# Patient Record
Sex: Female | Born: 1945 | Race: White | Hispanic: No | Marital: Married | Smoking: Current every day smoker
Health system: Southern US, Community
[De-identification: ages and names within clinical notes are randomized; demographics above are authoritative.]

## PROBLEM LIST (undated history)

## (undated) DIAGNOSIS — I499 Cardiac arrhythmia, unspecified: Secondary | ICD-10-CM

## (undated) DIAGNOSIS — J449 Chronic obstructive pulmonary disease, unspecified: Secondary | ICD-10-CM

## (undated) DIAGNOSIS — E119 Type 2 diabetes mellitus without complications: Secondary | ICD-10-CM

## (undated) DIAGNOSIS — R0602 Shortness of breath: Secondary | ICD-10-CM

## (undated) DIAGNOSIS — I10 Essential (primary) hypertension: Secondary | ICD-10-CM

## (undated) DIAGNOSIS — M1990 Unspecified osteoarthritis, unspecified site: Secondary | ICD-10-CM

## (undated) HISTORY — PX: CHOLECYSTECTOMY: SHX55

## (undated) HISTORY — PX: HERNIA REPAIR: SHX51

---

## 2004-05-28 ENCOUNTER — Emergency Department: Payer: Self-pay | Admitting: General Practice

## 2004-05-28 ENCOUNTER — Other Ambulatory Visit: Payer: Self-pay

## 2005-01-29 ENCOUNTER — Ambulatory Visit: Payer: Self-pay | Admitting: Family Medicine

## 2006-07-11 ENCOUNTER — Observation Stay: Payer: Self-pay | Admitting: Internal Medicine

## 2006-07-11 ENCOUNTER — Other Ambulatory Visit: Payer: Self-pay

## 2006-07-14 ENCOUNTER — Ambulatory Visit: Payer: Self-pay | Admitting: Internal Medicine

## 2006-08-07 ENCOUNTER — Ambulatory Visit: Payer: Self-pay

## 2007-08-10 ENCOUNTER — Ambulatory Visit: Payer: Self-pay

## 2008-08-08 ENCOUNTER — Inpatient Hospital Stay: Payer: Self-pay | Admitting: Specialist

## 2008-08-15 ENCOUNTER — Ambulatory Visit: Payer: Self-pay | Admitting: Specialist

## 2008-08-29 ENCOUNTER — Ambulatory Visit: Payer: Self-pay | Admitting: Specialist

## 2008-09-20 ENCOUNTER — Ambulatory Visit: Payer: Self-pay | Admitting: Specialist

## 2009-01-31 ENCOUNTER — Ambulatory Visit: Payer: Self-pay | Admitting: Unknown Physician Specialty

## 2009-03-15 ENCOUNTER — Ambulatory Visit: Payer: Self-pay | Admitting: Nurse Practitioner

## 2009-10-09 ENCOUNTER — Ambulatory Visit: Payer: Self-pay | Admitting: Nurse Practitioner

## 2010-10-11 ENCOUNTER — Ambulatory Visit: Payer: Self-pay | Admitting: Family Medicine

## 2011-11-08 ENCOUNTER — Ambulatory Visit: Payer: Self-pay

## 2011-11-12 ENCOUNTER — Ambulatory Visit: Payer: Self-pay | Admitting: Family Medicine

## 2011-12-24 ENCOUNTER — Ambulatory Visit: Payer: Self-pay | Admitting: Internal Medicine

## 2012-09-02 ENCOUNTER — Ambulatory Visit: Payer: Self-pay | Admitting: Surgery

## 2012-09-09 ENCOUNTER — Ambulatory Visit: Payer: Self-pay | Admitting: Surgery

## 2012-10-07 ENCOUNTER — Ambulatory Visit: Payer: Self-pay | Admitting: Internal Medicine

## 2012-11-12 ENCOUNTER — Ambulatory Visit: Payer: Self-pay | Admitting: Nurse Practitioner

## 2013-03-19 ENCOUNTER — Ambulatory Visit: Payer: Self-pay | Admitting: Cardiology

## 2013-11-30 ENCOUNTER — Ambulatory Visit: Payer: Self-pay | Admitting: Nurse Practitioner

## 2014-03-24 ENCOUNTER — Ambulatory Visit: Payer: Self-pay | Admitting: Physical Medicine and Rehabilitation

## 2014-04-19 ENCOUNTER — Other Ambulatory Visit (HOSPITAL_COMMUNITY): Payer: Self-pay | Admitting: Neurosurgery

## 2014-04-25 ENCOUNTER — Encounter (HOSPITAL_COMMUNITY): Payer: Self-pay | Admitting: Pharmacy Technician

## 2014-04-29 ENCOUNTER — Encounter (HOSPITAL_COMMUNITY)
Admission: RE | Admit: 2014-04-29 | Discharge: 2014-04-29 | Disposition: A | Discharge status: Home / Self Care | Payer: Medicare Other | Source: Ambulatory Visit | Attending: Neurosurgery | Admitting: Neurosurgery

## 2014-04-29 ENCOUNTER — Encounter (HOSPITAL_COMMUNITY): Payer: Self-pay

## 2014-04-29 DIAGNOSIS — E0590 Thyrotoxicosis, unspecified without thyrotoxic crisis or storm: Secondary | ICD-10-CM | POA: Diagnosis not present

## 2014-04-29 DIAGNOSIS — Z01818 Encounter for other preprocedural examination: Secondary | ICD-10-CM | POA: Insufficient documentation

## 2014-04-29 DIAGNOSIS — I10 Essential (primary) hypertension: Secondary | ICD-10-CM | POA: Diagnosis not present

## 2014-04-29 DIAGNOSIS — J449 Chronic obstructive pulmonary disease, unspecified: Secondary | ICD-10-CM | POA: Insufficient documentation

## 2014-04-29 DIAGNOSIS — E119 Type 2 diabetes mellitus without complications: Secondary | ICD-10-CM | POA: Diagnosis not present

## 2014-04-29 DIAGNOSIS — M4722 Other spondylosis with radiculopathy, cervical region: Secondary | ICD-10-CM | POA: Insufficient documentation

## 2014-04-29 HISTORY — DX: Essential (primary) hypertension: I10

## 2014-04-29 HISTORY — DX: Chronic obstructive pulmonary disease, unspecified: J44.9

## 2014-04-29 HISTORY — DX: Type 2 diabetes mellitus without complications: E11.9

## 2014-04-29 HISTORY — DX: Cardiac arrhythmia, unspecified: I49.9

## 2014-04-29 HISTORY — DX: Unspecified osteoarthritis, unspecified site: M19.90

## 2014-04-29 HISTORY — DX: Shortness of breath: R06.02

## 2014-04-29 LAB — CBC
HCT: 48.6 % — ABNORMAL HIGH (ref 36.0–46.0)
Hemoglobin: 16.1 g/dL — ABNORMAL HIGH (ref 12.0–15.0)
MCH: 28.6 pg (ref 26.0–34.0)
MCHC: 33.1 g/dL (ref 30.0–36.0)
MCV: 86.5 fL (ref 78.0–100.0)
Platelets: 288 10*3/uL (ref 150–400)
RBC: 5.62 MIL/uL — ABNORMAL HIGH (ref 3.87–5.11)
RDW: 13.7 % (ref 11.5–15.5)
WBC: 10.2 10*3/uL (ref 4.0–10.5)

## 2014-04-29 LAB — SURGICAL PCR SCREEN
MRSA, PCR: NEGATIVE
Staphylococcus aureus: NEGATIVE

## 2014-04-29 LAB — BASIC METABOLIC PANEL
Anion gap: 14 (ref 5–15)
BUN: 7 mg/dL (ref 6–23)
CO2: 27 mEq/L (ref 19–32)
CREATININE: 0.53 mg/dL (ref 0.50–1.10)
Calcium: 9.8 mg/dL (ref 8.4–10.5)
Chloride: 94 mEq/L — ABNORMAL LOW (ref 96–112)
GFR calc Af Amer: >90 mL/min (ref >90)
Glucose, Bld: 134 mg/dL — ABNORMAL HIGH (ref 70–99)
Potassium: 4.4 mEq/L (ref 3.7–5.3)
SODIUM: 135 meq/L — AB (ref 137–147)

## 2014-04-29 NOTE — Progress Notes (Signed)
REQUESTED CARDIAC CATH AND CXR  FROM [facility] (DR. FATH ) [phone]

## 2014-04-29 NOTE — Pre-Procedure Instructions (Signed)
[name]  04/29/2014   Your procedure is scheduled on:   Monday  05/09/14  [facility] Admitting at 900 AM.  Call this number if you have problems the morning of surgery: [phone]   Remember:   Do not eat food or drink liquids after midnight.   Take these medicines the morning of surgery with A SIP OF WATER:  ALBUTEROL INHALER, SYMBICORT INHALER, METOPROLOL (TOPROL), POTASSIUM   (STOP ASPIRIN, COUMADIN, PLAVIX, EFFIENT, HERBAL MEDICINES, OMEGA3)   Do not wear jewelry, make-up or nail polish.  Do not wear lotions, powders, or perfumes. You may wear deodorant.  Do not shave 48 hours prior to surgery. Men may shave face and neck.  Do not bring valuables to the hospital.  [facility] is not responsible                  for any belongings or valuables.               Contacts, dentures or bridgework may not be worn into surgery.  Leave suitcase in the car. After surgery it may be brought to your room.  For patients admitted to the hospital, discharge time is determined by your                treatment team.               Patients discharged the day of surgery will not be allowed to drive  home.  Name and phone number of your driver:   Special Instructions:  Special Instructions: [facility] - Preparing for Surgery  Before surgery, you can play an important role.  Because skin is not sterile, your skin needs to be as free of germs as possible.  You can reduce the number of germs on you skin by washing with CHG (chlorahexidine gluconate) soap before surgery.  CHG is an antiseptic cleaner which kills germs and bonds with the skin to continue killing germs even after washing.  Please DO NOT use if you have an allergy to CHG or antibacterial soaps.  If your skin becomes reddened/irritated stop using the CHG and inform your nurse when you arrive at Short Stay.  Do not shave (including legs and underarms) for at least 48 hours prior to the first CHG shower.  You may  shave your face.  Please follow these instructions carefully:   1.  Shower with CHG Soap the night before surgery and the morning of Surgery.  2.  If you choose to wash your hair, wash your hair first as usual with your normal shampoo.  3.  After you shampoo, rinse your hair and body thoroughly to remove the Shampoo.  4.  Use CHG as you would any other liquid soap. You can apply chg directly to the skin and wash gently with scrungie or a clean washcloth.  5.  Apply the CHG Soap to your body ONLY FROM THE NECK DOWN.  Do not use on open wounds or open sores.  Avoid contact with your eyes, ears, mouth and genitals (private parts).  Wash genitals (private parts with your normal soap.  6.  Wash thoroughly, paying special attention to the area where your surgery will be performed.  7.  Thoroughly rinse your body with warm water from the neck down.  8.  DO NOT shower/wash with your normal soap after using and rinsing off the CHG Soap.  9.  Pat yourself dry with a clean towel.  10.  Wear clean pajamas.            11.  Place clean sheets on your bed the night of your first shower and do not sleep with pets.  Day of Surgery  Do not apply any lotions/deodorants the morning of surgery.  Please wear clean clothes to the hospital/surgery center.   Please read over the following fact sheets that you were given: Pain Booklet, Coughing and Deep Breathing, MRSA Information and Surgical Site Infection Prevention

## 2014-05-02 NOTE — Progress Notes (Signed)
Anesthesia Chart Review:  Pt is 68 year old female scheduled for C6-7 anterior cervical decompression with fusion interbody prosthesis plating and bone graft on 05/09/14 with Dr. Lovell SheehanJenkins.   Cardiologist is Dr. Lady [facility]Fath at [facility]. Last visit 10/07/2013.   PMH: HTN, dysrhythmia (SVT), DM, COPD, hyperthyroidism  Medications include: ASA, amlodipine-benazepril, metoprolol, dyazide, metformin, symbicort, albuterol  Preoperative labs reviewed.    [facility] report from [facility] dated 04/14/2014 reviewed.  Showed mildly hyperexpanded lungs without acute cardiopulmonary disease.    EKG dated 10/07/2013 is in chart.  Shows sinus rhythm, possible septal infarct. QS in V1 V2. Minor right precordial repolarization disturbance consider infarct. Nonspecific T wave abnormality.   Pt had 30 day event monitor from 8/14-9/06/2013 that showed sinus rhythm.   Nuclear stress test 03/03/2013: 1. Normal treadmill ECG without evidence of ischemia or infarct or dysrhythmia 2. Average exercise tolerance for age 243. Normal LV function with EF 70% 4. Normal myocardial perfusion images at rest and peak stress without evidence of ischemia  2D echo 03/03/2013: 1. Normal LV systolic function. EF 60% 2. Mild tricuspid insufficiency 3. Mild pulmonary hypertension  R cardiac cath 03/19/2013 for pulmonary hypertension revealed mild to moderate pulmonary hypertension. Report on chart.   Pt has seen cardiologist within the year and had multiple cardiac studies since August 2014. Unless pt develops new symptoms, I anticipate pt can proceed with surgery as scheduled.   Rica Mastngela Kabbe, FNP-BC [facility]/Anesthesiology Phone: [phone] 05/02/2014 2:34 PM

## 2014-05-08 MED ORDER — CEFAZOLIN SODIUM-DEXTROSE 2-3 GM-% IV SOLR
2.0000 g | INTRAVENOUS | Status: AC
  Administered 2014-05-09: 2 g via INTRAVENOUS
  Filled 2014-05-08: qty 50

## 2014-05-09 ENCOUNTER — Encounter (HOSPITAL_COMMUNITY)
Admission: RE | Disposition: A | Discharge status: Home / Self Care | Payer: Self-pay | Source: Ambulatory Visit | Attending: Neurosurgery

## 2014-05-09 ENCOUNTER — Inpatient Hospital Stay (HOSPITAL_COMMUNITY)
Admission: RE | Admit: 2014-05-09 | Discharge: 2014-05-10 | DRG: 473 | Disposition: A | Discharge status: Home / Self Care | Payer: Medicare Other | Source: Ambulatory Visit | Attending: Neurosurgery | Admitting: Neurosurgery

## 2014-05-09 ENCOUNTER — Inpatient Hospital Stay (HOSPITAL_COMMUNITY): Payer: Medicare Other | Admitting: Certified Registered Nurse Anesthetist

## 2014-05-09 ENCOUNTER — Inpatient Hospital Stay (HOSPITAL_COMMUNITY): Payer: Medicare Other

## 2014-05-09 ENCOUNTER — Encounter (HOSPITAL_COMMUNITY): Payer: Self-pay | Admitting: *Deleted

## 2014-05-09 ENCOUNTER — Encounter (HOSPITAL_COMMUNITY): Payer: Medicare Other | Admitting: Vascular Surgery

## 2014-05-09 DIAGNOSIS — F17210 Nicotine dependence, cigarettes, uncomplicated: Secondary | ICD-10-CM | POA: Diagnosis present

## 2014-05-09 DIAGNOSIS — M542 Cervicalgia: Secondary | ICD-10-CM | POA: Diagnosis present

## 2014-05-09 DIAGNOSIS — M5020 Other cervical disc displacement, unspecified cervical region: Secondary | ICD-10-CM

## 2014-05-09 DIAGNOSIS — E119 Type 2 diabetes mellitus without complications: Secondary | ICD-10-CM | POA: Diagnosis present

## 2014-05-09 DIAGNOSIS — M4802 Spinal stenosis, cervical region: Secondary | ICD-10-CM | POA: Diagnosis present

## 2014-05-09 DIAGNOSIS — I10 Essential (primary) hypertension: Secondary | ICD-10-CM | POA: Diagnosis present

## 2014-05-09 DIAGNOSIS — M4722 Other spondylosis with radiculopathy, cervical region: Secondary | ICD-10-CM

## 2014-05-09 DIAGNOSIS — Z79899 Other long term (current) drug therapy: Secondary | ICD-10-CM

## 2014-05-09 DIAGNOSIS — M5012 Cervical disc disorder with radiculopathy, mid-cervical region: Secondary | ICD-10-CM | POA: Diagnosis present

## 2014-05-09 DIAGNOSIS — J449 Chronic obstructive pulmonary disease, unspecified: Secondary | ICD-10-CM | POA: Diagnosis present

## 2014-05-09 DIAGNOSIS — Z7982 Long term (current) use of aspirin: Secondary | ICD-10-CM | POA: Diagnosis not present

## 2014-05-09 HISTORY — PX: ANTERIOR CERVICAL DECOMP/DISCECTOMY FUSION: SHX1161

## 2014-05-09 LAB — GLUCOSE, CAPILLARY
Glucose-Capillary: 159 mg/dL — ABNORMAL HIGH (ref 70–99)
Glucose-Capillary: 125 mg/dL — ABNORMAL HIGH (ref 70–99)
Glucose-Capillary: 178 mg/dL — ABNORMAL HIGH (ref 70–99)

## 2014-05-09 SURGERY — ANTERIOR CERVICAL DECOMPRESSION/DISCECTOMY FUSION 1 LEVEL
Anesthesia: General

## 2014-05-09 MED ORDER — LACTATED RINGERS IV SOLN
INTRAVENOUS | Status: DC | PRN
  Administered 2014-05-09 (×2): via INTRAVENOUS

## 2014-05-09 MED ORDER — DIAZEPAM 5 MG PO TABS: 5.0000 mg | ORAL_TABLET | Freq: Four times a day (QID) | ORAL | Status: DC | PRN

## 2014-05-09 MED ORDER — 0.9 % SODIUM CHLORIDE (POUR BTL) OPTIME
TOPICAL | Status: DC | PRN
  Administered 2014-05-09: 1000 mL

## 2014-05-09 MED ORDER — GLYCOPYRROLATE 0.2 MG/ML IJ SOLN
INTRAMUSCULAR | Status: DC | PRN
  Administered 2014-05-09: 0.4 mg via INTRAVENOUS

## 2014-05-09 MED ORDER — ACETAMINOPHEN 325 MG PO TABS
650.0000 mg | ORAL_TABLET | ORAL | Status: DC | PRN
Start: 2014-05-09 — End: 2014-05-10

## 2014-05-09 MED ORDER — CEFAZOLIN SODIUM-DEXTROSE 2-3 GM-% IV SOLR
2.0000 g | Freq: Three times a day (TID) | INTRAVENOUS | Status: AC
  Administered 2014-05-09 – 2014-05-10 (×2): 2 g via INTRAVENOUS
  Filled 2014-05-09 (×2): qty 50

## 2014-05-09 MED ORDER — BUPIVACAINE-EPINEPHRINE (PF) 0.5% -1:200000 IJ SOLN
INTRAMUSCULAR | Status: DC | PRN
  Administered 2014-05-09: 10 mL via PERINEURAL

## 2014-05-09 MED ORDER — PHENYLEPHRINE 40 MCG/ML (10ML) SYRINGE FOR IV PUSH (FOR BLOOD PRESSURE SUPPORT)
PREFILLED_SYRINGE | INTRAVENOUS | Status: AC
  Filled 2014-05-09: qty 10

## 2014-05-09 MED ORDER — MORPHINE SULFATE 2 MG/ML IJ SOLN
1.0000 mg | INTRAMUSCULAR | Status: DC | PRN
  Administered 2014-05-09: 2 mg via INTRAVENOUS
  Filled 2014-05-09: qty 1

## 2014-05-09 MED ORDER — PHENYLEPHRINE HCL 10 MG/ML IJ SOLN
INTRAMUSCULAR | Status: DC | PRN
  Administered 2014-05-09 (×2): 80 ug via INTRAVENOUS

## 2014-05-09 MED ORDER — ALUM & MAG HYDROXIDE-SIMETH 200-200-20 MG/5ML PO SUSP: 30.0000 mL | Freq: Four times a day (QID) | ORAL | Status: DC | PRN

## 2014-05-09 MED ORDER — PROPOFOL 10 MG/ML IV BOLUS
INTRAVENOUS | Status: DC | PRN
  Administered 2014-05-09: 70 mg via INTRAVENOUS
  Administered 2014-05-09: 120 mg via INTRAVENOUS

## 2014-05-09 MED ORDER — MIDAZOLAM HCL 2 MG/2ML IJ SOLN
INTRAMUSCULAR | Status: AC
  Filled 2014-05-09: qty 2

## 2014-05-09 MED ORDER — METOPROLOL SUCCINATE ER 100 MG PO TB24
100.0000 mg | ORAL_TABLET | Freq: Every day | ORAL | Status: DC
  Administered 2014-05-10: 100 mg via ORAL
  Filled 2014-05-09 (×2): qty 1

## 2014-05-09 MED ORDER — GLYCOPYRROLATE 0.2 MG/ML IJ SOLN
INTRAMUSCULAR | Status: AC
  Filled 2014-05-09: qty 2

## 2014-05-09 MED ORDER — THROMBIN 5000 UNITS EX SOLR
CUTANEOUS | Status: DC | PRN
  Administered 2014-05-09: 5000 [IU] via TOPICAL

## 2014-05-09 MED ORDER — LIDOCAINE HCL (CARDIAC) 20 MG/ML IV SOLN
INTRAVENOUS | Status: AC
  Filled 2014-05-09: qty 5

## 2014-05-09 MED ORDER — HYDROCODONE-ACETAMINOPHEN 5-325 MG PO TABS
1.0000 | ORAL_TABLET | ORAL | Status: DC | PRN
  Administered 2014-05-09: 2 via ORAL
  Filled 2014-05-09: qty 2

## 2014-05-09 MED ORDER — INSULIN ASPART 100 UNIT/ML ~~LOC~~ SOLN
0.0000 [IU] | SUBCUTANEOUS | Status: DC
  Administered 2014-05-09: 3 [IU] via SUBCUTANEOUS

## 2014-05-09 MED ORDER — ONDANSETRON HCL 4 MG/2ML IJ SOLN: 4.0000 mg | Freq: Once | INTRAMUSCULAR | Status: DC | PRN

## 2014-05-09 MED ORDER — METFORMIN HCL ER 500 MG PO TB24
1000.0000 mg | ORAL_TABLET | Freq: Every day | ORAL | Status: DC
  Filled 2014-05-09 (×2): qty 2

## 2014-05-09 MED ORDER — OXYCODONE HCL 5 MG PO TABS: 5.0000 mg | ORAL_TABLET | Freq: Once | ORAL | Status: DC | PRN

## 2014-05-09 MED ORDER — PHENOL 1.4 % MT LIQD: 1.0000 | OROMUCOSAL | Status: DC | PRN

## 2014-05-09 MED ORDER — BUDESONIDE-FORMOTEROL FUMARATE 160-4.5 MCG/ACT IN AERO
2.0000 | INHALATION_SPRAY | Freq: Two times a day (BID) | RESPIRATORY_TRACT | Status: DC
Start: 2014-05-09 — End: 2014-05-10
  Administered 2014-05-09 – 2014-05-10 (×2): 2 via RESPIRATORY_TRACT
  Filled 2014-05-09: qty 6

## 2014-05-09 MED ORDER — LACTATED RINGERS IV SOLN
INTRAVENOUS | Status: DC
  Administered 2014-05-09: 09:00:00 via INTRAVENOUS

## 2014-05-09 MED ORDER — NEOSTIGMINE METHYLSULFATE 10 MG/10ML IV SOLN
INTRAVENOUS | Status: AC
  Filled 2014-05-09: qty 1

## 2014-05-09 MED ORDER — PROPOFOL 10 MG/ML IV BOLUS
INTRAVENOUS | Status: AC
  Filled 2014-05-09: qty 20

## 2014-05-09 MED ORDER — DOCUSATE SODIUM 100 MG PO CAPS
100.0000 mg | ORAL_CAPSULE | Freq: Two times a day (BID) | ORAL | Status: DC
  Filled 2014-05-09 (×3): qty 1

## 2014-05-09 MED ORDER — MENTHOL 3 MG MT LOZG: 1.0000 | LOZENGE | OROMUCOSAL | Status: DC | PRN

## 2014-05-09 MED ORDER — OXYCODONE-ACETAMINOPHEN 5-325 MG PO TABS: 1.0000 | ORAL_TABLET | ORAL | Status: DC | PRN

## 2014-05-09 MED ORDER — FENTANYL CITRATE 0.05 MG/ML IJ SOLN
INTRAMUSCULAR | Status: DC | PRN
  Administered 2014-05-09: 150 ug via INTRAVENOUS
  Administered 2014-05-09: 50 ug via INTRAVENOUS

## 2014-05-09 MED ORDER — ROCURONIUM BROMIDE 100 MG/10ML IV SOLN
INTRAVENOUS | Status: DC | PRN
  Administered 2014-05-09: 50 mg via INTRAVENOUS

## 2014-05-09 MED ORDER — TRIAMTERENE-HCTZ 37.5-25 MG PO CAPS
1.0000 | ORAL_CAPSULE | Freq: Every day | ORAL | Status: DC
  Filled 2014-05-09: qty 1

## 2014-05-09 MED ORDER — INSULIN ASPART 100 UNIT/ML ~~LOC~~ SOLN: 0.0000 [IU] | Freq: Three times a day (TID) | SUBCUTANEOUS | Status: DC

## 2014-05-09 MED ORDER — ACETAMINOPHEN 650 MG RE SUPP: 650.0000 mg | RECTAL | Status: DC | PRN

## 2014-05-09 MED ORDER — AMLODIPINE BESYLATE 2.5 MG PO TABS
2.5000 mg | ORAL_TABLET | Freq: Every day | ORAL | Status: DC
  Filled 2014-05-09: qty 1

## 2014-05-09 MED ORDER — SUCCINYLCHOLINE CHLORIDE 20 MG/ML IJ SOLN
INTRAMUSCULAR | Status: AC
  Filled 2014-05-09: qty 1

## 2014-05-09 MED ORDER — DSS 100 MG PO CAPS: 100.0000 mg | ORAL_CAPSULE | Freq: Two times a day (BID) | ORAL | Status: AC

## 2014-05-09 MED ORDER — ONDANSETRON HCL 4 MG/2ML IJ SOLN
INTRAMUSCULAR | Status: AC
  Filled 2014-05-09: qty 2

## 2014-05-09 MED ORDER — NEOSTIGMINE METHYLSULFATE 10 MG/10ML IV SOLN
INTRAVENOUS | Status: DC | PRN
  Administered 2014-05-09: 3 mg via INTRAVENOUS

## 2014-05-09 MED ORDER — MEPERIDINE HCL 25 MG/ML IJ SOLN: 6.2500 mg | INTRAMUSCULAR | Status: DC | PRN

## 2014-05-09 MED ORDER — DIAZEPAM 5 MG PO TABS: 5.0000 mg | ORAL_TABLET | Freq: Four times a day (QID) | ORAL | Status: AC | PRN

## 2014-05-09 MED ORDER — METFORMIN HCL ER 500 MG PO TB24
500.0000 mg | ORAL_TABLET | Freq: Every day | ORAL | Status: DC
  Administered 2014-05-10: 500 mg via ORAL
  Filled 2014-05-09 (×2): qty 1

## 2014-05-09 MED ORDER — AMLODIPINE BESY-BENAZEPRIL HCL 2.5-10 MG PO CAPS: 1.0000 | ORAL_CAPSULE | Freq: Every day | ORAL | Status: DC

## 2014-05-09 MED ORDER — EPHEDRINE SULFATE 50 MG/ML IJ SOLN
INTRAMUSCULAR | Status: AC
  Filled 2014-05-09: qty 1

## 2014-05-09 MED ORDER — HYDROMORPHONE HCL 1 MG/ML IJ SOLN: 0.2500 mg | INTRAMUSCULAR | Status: DC | PRN

## 2014-05-09 MED ORDER — SODIUM CHLORIDE 0.9 % IR SOLN
Status: DC | PRN
  Administered 2014-05-09: 12:00:00

## 2014-05-09 MED ORDER — ONDANSETRON HCL 4 MG/2ML IJ SOLN
INTRAMUSCULAR | Status: DC | PRN
  Administered 2014-05-09: 4 mg via INTRAVENOUS

## 2014-05-09 MED ORDER — ONDANSETRON HCL 4 MG/2ML IJ SOLN
4.0000 mg | INTRAMUSCULAR | Status: DC | PRN
  Administered 2014-05-09: 4 mg via INTRAVENOUS
  Filled 2014-05-09: qty 2

## 2014-05-09 MED ORDER — METFORMIN HCL ER 500 MG PO TB24: 500.0000 mg | ORAL_TABLET | Freq: Two times a day (BID) | ORAL | Status: DC

## 2014-05-09 MED ORDER — LIDOCAINE HCL (CARDIAC) 20 MG/ML IV SOLN
INTRAVENOUS | Status: DC | PRN
  Administered 2014-05-09: 100 mg via INTRAVENOUS

## 2014-05-09 MED ORDER — DEXAMETHASONE SODIUM PHOSPHATE 4 MG/ML IJ SOLN
4.0000 mg | Freq: Four times a day (QID) | INTRAMUSCULAR | Status: DC
  Administered 2014-05-09: 4 mg via INTRAVENOUS
  Filled 2014-05-09 (×2): qty 1

## 2014-05-09 MED ORDER — MIDAZOLAM HCL 5 MG/5ML IJ SOLN
INTRAMUSCULAR | Status: DC | PRN
  Administered 2014-05-09: 2 mg via INTRAVENOUS

## 2014-05-09 MED ORDER — OXYCODONE-ACETAMINOPHEN 10-325 MG PO TABS: 1.0000 | ORAL_TABLET | ORAL | Status: AC | PRN

## 2014-05-09 MED ORDER — DEXAMETHASONE 4 MG PO TABS
4.0000 mg | ORAL_TABLET | Freq: Four times a day (QID) | ORAL | Status: DC
  Administered 2014-05-09 – 2014-05-10 (×2): 4 mg via ORAL
  Filled 2014-05-09 (×3): qty 1

## 2014-05-09 MED ORDER — HEMOSTATIC AGENTS (NO CHARGE) OPTIME
TOPICAL | Status: DC | PRN
  Administered 2014-05-09: 1 via TOPICAL

## 2014-05-09 MED ORDER — ALBUTEROL SULFATE (2.5 MG/3ML) 0.083% IN NEBU: 3.0000 mL | INHALATION_SOLUTION | Freq: Four times a day (QID) | RESPIRATORY_TRACT | Status: DC | PRN

## 2014-05-09 MED ORDER — INSULIN ASPART 100 UNIT/ML ~~LOC~~ SOLN: 0.0000 [IU] | Freq: Every day | SUBCUTANEOUS | Status: DC

## 2014-05-09 MED ORDER — FENTANYL CITRATE 0.05 MG/ML IJ SOLN
INTRAMUSCULAR | Status: AC
  Filled 2014-05-09: qty 5

## 2014-05-09 MED ORDER — LACTATED RINGERS IV SOLN: INTRAVENOUS | Status: DC

## 2014-05-09 MED ORDER — ROCURONIUM BROMIDE 50 MG/5ML IV SOLN
INTRAVENOUS | Status: AC
  Filled 2014-05-09: qty 1

## 2014-05-09 MED ORDER — OXYCODONE HCL 5 MG/5ML PO SOLN
5.0000 mg | Freq: Once | ORAL | Status: DC | PRN
Start: 2014-05-09 — End: 2014-05-09

## 2014-05-09 MED ORDER — BENAZEPRIL HCL 10 MG PO TABS
10.0000 mg | ORAL_TABLET | Freq: Every day | ORAL | Status: DC
  Filled 2014-05-09: qty 1

## 2014-05-09 MED ORDER — SODIUM CHLORIDE 0.9 % IJ SOLN
INTRAMUSCULAR | Status: AC
  Filled 2014-05-09: qty 10

## 2014-05-09 SURGICAL SUPPLY — 64 items
BAG DECANTER FOR FLEXI CONT (MISCELLANEOUS) ×2 IMPLANT
BENZOIN TINCTURE PRP APPL 2/3 (GAUZE/BANDAGES/DRESSINGS) ×2 IMPLANT
BIT DRILL NEURO 2X3.1 SFT TUCH (MISCELLANEOUS) ×1 IMPLANT
BLADE SURG 15 STRL LF DISP TIS (BLADE) ×1 IMPLANT
BLADE SURG 15 STRL SS (BLADE) ×1
BLADE ULTRA TIP 2M (BLADE) ×2 IMPLANT
BRUSH SCRUB EZ PLAIN DRY (MISCELLANEOUS) ×2 IMPLANT
BUR BARREL STRAIGHT FLUTE 4.0 (BURR) ×2 IMPLANT
BUR MATCHSTICK NEURO 3.0 LAGG (BURR) ×4 IMPLANT
CANISTER SUCT 3000ML (MISCELLANEOUS) ×2 IMPLANT
CONT SPEC 4OZ CLIKSEAL STRL BL (MISCELLANEOUS) ×2 IMPLANT
COVER MAYO STAND STRL (DRAPES) ×2 IMPLANT
DRAPE LAPAROTOMY 100X72 PEDS (DRAPES) ×2 IMPLANT
DRAPE MICROSCOPE LEICA (MISCELLANEOUS) IMPLANT
DRAPE POUCH INSTRU U-SHP 10X18 (DRAPES) ×2 IMPLANT
DRAPE SURG 17X23 STRL (DRAPES) ×4 IMPLANT
DRILL NEURO 2X3.1 SOFT TOUCH (MISCELLANEOUS) ×2
ELECT REM PT RETURN 9FT ADLT (ELECTROSURGICAL) ×2
ELECTRODE REM PT RTRN 9FT ADLT (ELECTROSURGICAL) ×1 IMPLANT
GAUZE SPONGE 4X4 12PLY STRL (GAUZE/BANDAGES/DRESSINGS) ×2 IMPLANT
GAUZE SPONGE 4X4 16PLY XRAY LF (GAUZE/BANDAGES/DRESSINGS) IMPLANT
GLOVE BIO SURGEON STRL SZ8.5 (GLOVE) ×2 IMPLANT
GLOVE BIOGEL PI IND STRL 7.5 (GLOVE) ×1 IMPLANT
GLOVE BIOGEL PI IND STRL 8 (GLOVE) ×1 IMPLANT
GLOVE BIOGEL PI INDICATOR 7.5 (GLOVE) ×1
GLOVE BIOGEL PI INDICATOR 8 (GLOVE) ×1
GLOVE ECLIPSE 7.5 STRL STRAW (GLOVE) ×4 IMPLANT
GLOVE ECLIPSE 9.0 STRL (GLOVE) ×2 IMPLANT
GLOVE EXAM NITRILE LRG STRL (GLOVE) IMPLANT
GLOVE EXAM NITRILE MD LF STRL (GLOVE) IMPLANT
GLOVE EXAM NITRILE XL STR (GLOVE) IMPLANT
GLOVE EXAM NITRILE XS STR PU (GLOVE) IMPLANT
GLOVE SS BIOGEL STRL SZ 8 (GLOVE) ×1 IMPLANT
GLOVE SUPERSENSE BIOGEL SZ 8 (GLOVE) ×1
GLOVE SURG SS PI 7.0 STRL IVOR (GLOVE) ×6 IMPLANT
GOWN STRL REUS W/ TWL LRG LVL3 (GOWN DISPOSABLE) IMPLANT
GOWN STRL REUS W/ TWL XL LVL3 (GOWN DISPOSABLE) ×4 IMPLANT
GOWN STRL REUS W/TWL 2XL LVL3 (GOWN DISPOSABLE) ×2 IMPLANT
GOWN STRL REUS W/TWL LRG LVL3 (GOWN DISPOSABLE)
GOWN STRL REUS W/TWL XL LVL3 (GOWN DISPOSABLE) ×4
KIT BASIN OR (CUSTOM PROCEDURE TRAY) ×2 IMPLANT
KIT ROOM TURNOVER OR (KITS) ×2 IMPLANT
MARKER SKIN DUAL TIP RULER LAB (MISCELLANEOUS) ×2 IMPLANT
NEEDLE HYPO 22GX1.5 SAFETY (NEEDLE) ×2 IMPLANT
NEEDLE SPNL 18GX3.5 QUINCKE PK (NEEDLE) ×2 IMPLANT
NS IRRIG 1000ML POUR BTL (IV SOLUTION) ×2 IMPLANT
PACK LAMINECTOMY NEURO (CUSTOM PROCEDURE TRAY) ×2 IMPLANT
PEEK VISTA 14X14X8MM (Peek) ×2 IMPLANT
PIN DISTRACTION 14MM (PIN) ×4 IMPLANT
PLATE ANT CERV XTEND 1 LV 16 (Plate) ×2 IMPLANT
PUTTY BIOACTIVE 2CC KINEX (Miscellaneous) ×2 IMPLANT
RUBBERBAND STERILE (MISCELLANEOUS) IMPLANT
SCREW XTD VAR 4.2 SELF TAP 12 (Screw) ×8 IMPLANT
SPONGE INTESTINAL PEANUT (DISPOSABLE) ×4 IMPLANT
SPONGE SURGIFOAM ABS GEL SZ50 (HEMOSTASIS) ×2 IMPLANT
STRIP CLOSURE SKIN 1/2X4 (GAUZE/BANDAGES/DRESSINGS) ×2 IMPLANT
SUT VIC AB 0 CT1 27 (SUTURE) ×1
SUT VIC AB 0 CT1 27XBRD ANTBC (SUTURE) ×1 IMPLANT
SUT VIC AB 3-0 SH 8-18 (SUTURE) ×2 IMPLANT
SYR 20ML ECCENTRIC (SYRINGE) ×2 IMPLANT
TAPE CLOTH SURG 4X10 WHT LF (GAUZE/BANDAGES/DRESSINGS) ×2 IMPLANT
TOWEL OR 17X24 6PK STRL BLUE (TOWEL DISPOSABLE) ×2 IMPLANT
TOWEL OR 17X26 10 PK STRL BLUE (TOWEL DISPOSABLE) ×2 IMPLANT
WATER STERILE IRR 1000ML POUR (IV SOLUTION) ×2 IMPLANT

## 2014-05-09 NOTE — Transfer of Care (Signed)
Immediate Anesthesia Transfer of Care Note  Patient: [name]  Procedure(s) Performed: Procedure(s) with comments: CERVICAL SIX TO SEVEN ANTERIOR CERVICAL DECOMPRESSION/DISCECTOMY FUSION 1 LEVEL (N/A) - C67 anterior cervical decompression with fusion interbody prosthesis plating and bonegraft  Patient Location: PACU  Anesthesia Type:General  Level of Consciousness: awake, alert , oriented and patient cooperative  Airway & Oxygen Therapy: Patient Spontanous Breathing and Patient connected to nasal cannula oxygen  Post-op Assessment: Report given to PACU RN, Post -op Vital signs reviewed and stable and Patient moving all extremities  Post vital signs: Reviewed and stable  Complications: No apparent anesthesia complications

## 2014-05-09 NOTE — Progress Notes (Signed)
Utilization review completed.  

## 2014-05-09 NOTE — Progress Notes (Signed)
Orthopedic Tech Progress Note Patient Details:  Kathi SimpersMary C Ciotti Feb 12, 1946 [id number] Patient has soft collar  Patient ID: [name], female   DOB: Feb 12, 1946, 68 y.o.   MRN: [id number]   Jennye MoccasinHughes, Krystal Delduca Craig 05/09/2014, 4:42 PM

## 2014-05-09 NOTE — Anesthesia Procedure Notes (Signed)
Procedure Name: Intubation Date/Time: 05/09/2014 11:08 AM Performed by: Jerilee HohMUMM, Warda Mcqueary N Pre-anesthesia Checklist: Patient identified, Emergency Drugs available, Suction available and Patient being monitored Patient Re-evaluated:Patient Re-evaluated prior to inductionOxygen Delivery Method: Circle system utilized Preoxygenation: Pre-oxygenation with 100% oxygen Intubation Type: IV induction Ventilation: Mask ventilation without difficulty Laryngoscope Size: Mac and 3 Grade View: Grade I Tube type: Oral Tube size: 7.0 mm Number of attempts: 1 Airway Equipment and Method: Stylet Placement Confirmation: ETT inserted through vocal cords under direct vision,  positive ETCO2 and breath sounds checked- equal and bilateral Secured at: 21 cm Tube secured with: Tape Dental Injury: Teeth and Oropharynx as per pre-operative assessment

## 2014-05-09 NOTE — Anesthesia Preprocedure Evaluation (Signed)
Anesthesia Evaluation  Patient identified by MRN, date of birth, ID band Patient awake    Reviewed: Allergy & Precautions, H&P , NPO status , Patient's Chart, lab work & pertinent test results  Airway Mallampati: I  TM Distance: >3 FB Neck ROM: Full    Dental   Pulmonary Current Smoker,          Cardiovascular hypertension, Pt. on medications     Neuro/Psych    GI/Hepatic   Endo/Other  diabetes, Type 2, Oral Hypoglycemic Agents  Renal/GU      Musculoskeletal   Abdominal   Peds  Hematology   Anesthesia Other Findings   Reproductive/Obstetrics                             Anesthesia Physical Anesthesia Plan  ASA: II  Anesthesia Plan: General   Post-op Pain Management:    Induction: Intravenous  Airway Management Planned: Oral ETT  Additional Equipment:   Intra-op Plan:   Post-operative Plan: Extubation in OR  Informed Consent: I have reviewed the patients History and Physical, chart, labs and discussed the procedure including the risks, benefits and alternatives for the proposed anesthesia with the patient or authorized representative who has indicated his/her understanding and acceptance.     Plan Discussed with: CRNA and Surgeon  Anesthesia Plan Comments:         Anesthesia Quick Evaluation  

## 2014-05-09 NOTE — Progress Notes (Signed)
Patient ID: Janet SimpersMary C [name], female   DOB: 1946/06/18, 68 y.o.   MRN: [id number] Subjective:  The patient is alert and pleasant. She is in no apparent distress.  Objective: Vital signs in last 24 hours: Temp:  [97 F (36.1 C)-98 F (36.7 C)] 98 F (36.7 C) (10/26 1633) Pulse Rate:  [67-89] 67 (10/26 1633) Resp:  [15-36] 18 (10/26 1633) BP: (115-150)/(51-83) 122/66 mmHg (10/26 1633) SpO2:  [95 %-100 %] 96 % (10/26 1633) Weight:  [87.091 kg (192 lb)] 87.091 kg (192 lb) (10/26 0853)  Intake/Output from previous day:   Intake/Output this shift: Total I/O In: 1640 [P.O.:240; I.V.:1400] Out: 50 [Blood:50]  Physical exam the patient is alert and oriented. She is moving all 4 extremities well. Her dressing is clean and dry. There is no evidence of hematoma or shift.  Lab Results: No results found for this basename: WBC, HGB, HCT, PLT,  in the last 72 hours BMET No results found for this basename: NA, K, CL, CO2, GLUCOSE, BUN, CREATININE, CALCIUM,  in the last 72 hours  Studies/Results: Dg Cervical Spine 2-3 Views  05/09/2014   CLINICAL DATA:  Cervical spine surgery.  EXAM: CERVICAL SPINE - 2-3 VIEW  COMPARISON:  MRI 03/24/2014.  FINDINGS: Metallic marker is noted over the anterior aspect of the C4-C5 disc space. No acute bony abnormality. Patient is intubated.  IMPRESSION: Metallic marker is noted over the anterior aspect of the C4-C5 disc space. These results will be called to the ordering clinician or representative by the Radiologist Assistant, and communication documented in the PACS or zVision Dashboard.   Electronically Signed   By: Maisie Fushomas  Register   On: 05/09/2014 13:12    Assessment/Plan: The patient is doing well. She likely wants to stay overnight. I gave her discharge instructions and answered all her questions.  LOS: 0 days     Consuela Widener D 05/09/2014, 6:39 PM

## 2014-05-09 NOTE — Anesthesia Postprocedure Evaluation (Signed)
Anesthesia Post Note  Patient: [name]  Procedure(s) Performed: Procedure(s) (LRB): CERVICAL SIX TO SEVEN ANTERIOR CERVICAL DECOMPRESSION/DISCECTOMY FUSION 1 LEVEL (N/A)  Anesthesia type: general  Patient location: PACU  Post pain: Pain level controlled  Post assessment: Patient's Cardiovascular Status Stable  Last Vitals:  Filed Vitals:   05/09/14 1427  BP: 115/51  Pulse: 76  Temp: 36.4 C  Resp: 18    Post vital signs: Reviewed and stable  Level of consciousness: sedated  Complications: No apparent anesthesia complications

## 2014-05-09 NOTE — H&P (Signed)
Subjective: The patient is a 68 year old white female who has complained of neck pain with numbness and tingling in her right arm. She has failed medical management and was worked up with a cervical MRI which demonstrated spondylosis and stenosis most prominent at C6-7. I discussed the various treatment options with the patient. She has decided to proceed with surgery   Past Medical History  Diagnosis Date  . Hypertension   . Dysrhythmia     RAPID  HEART BEAT   . COPD (chronic obstructive pulmonary disease)   . Shortness of breath     WITH EXERTION   . Diabetes mellitus without complication   . Arthritis     Past Surgical History  Procedure Laterality Date  . Hernia repair    . Cholecystectomy    . Cesarean section      Allergies  Allergen Reactions  . Tramadol Nausea Only    History  Substance Use Topics  . Smoking status: Current Every Day Smoker -- 0.50 packs/day for 50 years  . Smokeless tobacco: Not on file  . Alcohol Use: No    History reviewed. No pertinent family history. Prior to Admission medications   Medication Sig Start Date End Date Taking? Authorizing Provider  amlodipine-benazepril (LOTREL) 2.5-10 MG per capsule Take 1 capsule by mouth daily.   Yes Historical Provider, MD  aspirin EC 81 MG tablet Take 81 mg by mouth daily.   Yes Historical Provider, MD  budesonide-formoterol (SYMBICORT) 160-4.5 MCG/ACT inhaler Inhale 2 puffs into the lungs 2 (two) times daily.   Yes Historical Provider, MD  metFORMIN (GLUCOPHAGE-XR) 500 MG 24 hr tablet Take 500-1,000 mg by mouth 2 (two) times daily. Take 500 mg by mouth in the morning and take 1000 mg by mouth at bedtime.   Yes Historical Provider, MD  metoprolol succinate (TOPROL-XL) 100 MG 24 hr tablet Take 100 mg by mouth daily. Take with or immediately following a meal.   Yes Historical Provider, MD  Omega-3 Fatty Acids (ULTRA OMEGA 3 PO) Take 1 capsule by mouth daily.   Yes Historical Provider, MD  Potassium Gluconate 595  MG CAPS Take 595 mg by mouth 6 (six) times daily.   Yes Historical Provider, MD  triamterene-hydrochlorothiazide (DYAZIDE) 37.5-25 MG per capsule Take 1 capsule by mouth daily.   Yes Historical Provider, MD  albuterol (PROVENTIL HFA;VENTOLIN HFA) 108 (90 BASE) MCG/ACT inhaler Inhale 1-2 puffs into the lungs every 6 (six) hours as needed for wheezing or shortness of breath.    Historical Provider, MD     Review of Systems  Positive ROS: As above  All other systems have been reviewed and were otherwise negative with the exception of those mentioned in the HPI and as above.  Objective: Vital signs in last 24 hours: Temp:  [97.5 F (36.4 C)] 97.5 F (36.4 C) (10/26 0853) Pulse Rate:  [70] 70 (10/26 0853) Resp:  [20] 20 (10/26 0853) BP: (150)/(66) 150/66 mmHg (10/26 0853) SpO2:  [99 %] 99 % (10/26 0853) Weight:  [87.091 kg (192 lb)] 87.091 kg (192 lb) (10/26 0853)  General Appearance: Alert, cooperative, no distress, Head: Normocephalic, without obvious abnormality, atraumatic Eyes: PERRL, conjunctiva/corneas clear, EOM's intact,    Ears: Normal  Throat: Normal  Neck: Supple, symmetrical, trachea midline, no adenopathy; thyroid: No enlargement/tenderness/nodules; no carotid bruit or JVD Back: Symmetric, no curvature, ROM normal, no CVA tenderness Lungs: Clear to auscultation bilaterally, respirations unlabored Heart: Regular rate and rhythm, no murmur, rub or gallop Abdomen: Soft, non-tender,, no  masses, no organomegaly Extremities: Extremities normal, atraumatic, no cyanosis or edema Pulses: 2+ and symmetric all extremities Skin: Skin color, texture, turgor normal, no rashes or lesions  NEUROLOGIC:   Mental status: alert and oriented, no aphasia, good attention span, Fund of knowledge/ memory ok Motor Exam - grossly normal Sensory Exam - grossly normal Reflexes:  Coordination - grossly normal Gait - grossly normal Balance - grossly normal Cranial Nerves: I: smell Not tested   II: visual acuity  OS: Normal  OD: Normal   II: visual fields Full to confrontation  II: pupils Equal, round, reactive to light  III,VII: ptosis None  III,IV,VI: extraocular muscles  Full ROM  V: mastication Normal  V: facial light touch sensation  Normal  V,VII: corneal reflex  Present  VII: facial muscle function - upper  Normal  VII: facial muscle function - lower Normal  VIII: hearing Not tested  IX: soft palate elevation  Normal  IX,X: gag reflex Present  XI: trapezius strength  5/5  XI: sternocleidomastoid strength 5/5  XI: neck flexion strength  5/5  XII: tongue strength  Normal    Data Review Lab Results  Component Value Date   WBC 10.2 04/29/2014   HGB 16.1* 04/29/2014   HCT 48.6* 04/29/2014   MCV 86.5 04/29/2014   PLT 288 04/29/2014   Lab Results  Component Value Date   NA 135* 04/29/2014   K 4.4 04/29/2014   CL 94* 04/29/2014   CO2 27 04/29/2014   BUN 7 04/29/2014   CREATININE 0.53 04/29/2014   GLUCOSE 134* 04/29/2014   No results found for this basename: INR, PROTIME    Assessment/Plan: 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 I have reviewed her imaging studies with her and pointed out the abnormalities. We have discussed the various treatment options including surgery. I have described the surgical treatment option of a C6-7 anterior cervical discectomy, fusion, and plating. I have shown her surgical models. We have discussed the risks, benefits, alternatives, and likelihood of achieving goals with surgery. I have answered all the patient's questions. She has decided to proceed with surgery.   Kody Vigil D 05/09/2014 10:53 AM

## 2014-05-09 NOTE — Op Note (Signed)
Brief history: The patient is a 68 year old white female who has complained of neck and right arm pain consistent with a cervical radiculopathy. She has failed medical management and was worked up with cervical MRI which demonstrates spondylosis and foraminal stenosis at C6-7. I discussed the various treatment options with patient including surgery. She has weighed the risks, benefits, and alternative surgery and decided to proceed with a C6-7 anterior cervical discectomy, fusion, and plating.  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 Delma OfficerJeff Antwan Bribiesca  Asst.: Dr. Altamease OilerAndy Pool  Anesthesia: Gen. endotracheal  Estimated blood loss: 50 mL  Drains: None  Complications: None  Description of procedure: The patient was brought to the operating room by the anesthesia team. General endotracheal anesthesia was induced. A roll was placed under the patient's shoulders to keep the neck in the neutral position. The patient's anterior cervical region was then prepared with Betadine scrub and Betadine solution. Sterile drapes were applied.  The area to be incised was then injected with Marcaine with epinephrine solution. I then used a scalpel to make a transverse incision in the patient's left anterior neck. I used the Metzenbaum scissors to divide the platysmal muscle and then to dissect medial to the sternocleidomastoid muscle, jugular vein, and carotid artery. I carefully dissected down towards the anterior cervical spine identifying the esophagus and retracting it medially. Then using Kitner swabs to clear  soft tissue from the anterior cervical spine. We then inserted a bent spinal needle into the upper exposed intervertebral disc space. We then obtained intraoperative radiographs confirm our location.  I then used electrocautery to detach the medial border of the longus colli muscle bilaterally from the C6-7 intervertebral disc spaces. I then inserted the Caspar self-retaining retractor underneath the longus colli muscle bilaterally to provide exposure.  We then incised the intervertebral disc at C6-7. We then performed a partial intervertebral discectomy with a pituitary forceps and the Karlin curettes. I then inserted distraction screws into the vertebral bodies at C6-7. We then distracted the interspace. We then used the high-speed drill to decorticate the vertebral endplates at C6-7, to drill away the remainder of the intervertebral disc, to drill away some posterior spondylosis, and to thin out the posterior longitudinal ligament. I then incised ligament with the arachnoid knife. We then removed the ligament with a Kerrison punches undercutting the vertebral endplates and decompressing the thecal sac. We then performed foraminotomies about the bilateral C7 nerve roots. This completed the decompression at this level.  We now turned our to attention to the interbody fusion. We used the trial spacers to determine the appropriate size for the interbody prosthesis. We then pre-filled prosthesis with a combination of local morcellized autograft bone that we obtained during decompression as well as Kinnex bone graft extender. We then inserted the prosthesis into the distracted interspace at C6-7. We then removed the distraction screws. There was a good snug fit of the prosthesis in the interspace.  Having completed the fusion we now turned attention to the anterior spinal instrumentation. We used the high-speed drill to drill away some anterior spondylosis at the disc spaces so that the plate lay down flat. We  selected the appropriate length titanium anterior cervical plate. We laid it  along the anterior aspect of the vertebral bodies from C6-7. We then drilled 12 mm holes at C6 and C7. We then secured the plate to the vertebral bodies by placing two 12 mm self-tapping screws at C6 and C7. We then obtained intraoperative radiograph. The demonstrating good position of the instrumentation. We therefore secured the screws the plate the locking each cam. This completed the instrumentation.  We then obtained hemostasis using bipolar electrocautery. We irrigated the wound out with bacitracin solution. We then removed the retractor. We inspected the esophagus for any damage. There was none apparent. We then reapproximated patient's platysmal muscle with interrupted 3-0 Vicryl suture. We then reapproximated the subcutaneous tissue with interrupted 3-0 Vicryl suture. The skin was reapproximated with Steri-Strips and benzoin. The wound was then covered with bacitracin ointment. A sterile dressing was applied. The drapes were removed. Patient was subsequently extubated by the anesthesia team and transported to the post anesthesia care unit in stable condition. All sponge instrument and needle counts were reportedly correct at the end of this case.

## 2014-05-09 NOTE — Progress Notes (Signed)
Subjective:  The patient is alert and pleasant. She is in no apparent distress.  Objective: Vital signs in last 24 hours: Temp:  [97.2 F (36.2 C)-97.5 F (36.4 C)] 97.2 F (36.2 C) (10/26 1307) Pulse Rate:  [70-89] 85 (10/26 1322) Resp:  [15-36] 15 (10/26 1322) BP: (125-150)/(55-83) 135/55 mmHg (10/26 1322) SpO2:  [97 %-99 %] 99 % (10/26 1322) Weight:  [87.091 kg (192 lb)] 87.091 kg (192 lb) (10/26 0853)  Intake/Output from previous day:   Intake/Output this shift: Total I/O In: 1400 [I.V.:1400] Out: 50 [Blood:50]  Physical exam the patient is alert and pleasant. She is moving all 4 extremities well. Her dressing is clean and dry. There is no evidence of hematoma or shift.  Lab Results: No results found for this basename: WBC, HGB, HCT, PLT,  in the last 72 hours BMET No results found for this basename: NA, K, CL, CO2, GLUCOSE, BUN, CREATININE, CALCIUM,  in the last 72 hours  Studies/Results: Dg Cervical Spine 2-3 Views  05/09/2014   CLINICAL DATA:  Cervical spine surgery.  EXAM: CERVICAL SPINE - 2-3 VIEW  COMPARISON:  MRI 03/24/2014.  FINDINGS: Metallic marker is noted over the anterior aspect of the C4-C5 disc space. No acute bony abnormality. Patient is intubated.  IMPRESSION: Metallic marker is noted over the anterior aspect of the C4-C5 disc space. These results will be called to the ordering clinician or representative by the Radiologist Assistant, and communication documented in the PACS or zVision Dashboard.   Electronically Signed   By: Maisie Fushomas  Register   On: 05/09/2014 13:12    Assessment/Plan: The patient is doing well. I spoke with her family.  LOS: 0 days     Aiana Nordquist D 05/09/2014, 1:31 PM

## 2014-05-09 NOTE — Plan of Care (Signed)
Problem: Consults Goal: Diagnosis - Spinal Surgery Outcome: Completed/Met Date Met:  05/09/14 Cervical Spine Fusion     

## 2014-05-09 NOTE — Discharge Instructions (Signed)
Wound Care Keep incision covered and dry for  You may remove outer bandage after 3 days  Do not put any creams, lotions, or ointments on incision. Leave steri-strips on neck.  They will fall off by themselves. Activity Walk each and every day, increasing distance each day. No lifting greater than 5 lbs.  Avoid excessive neck motion. No driving for 2 weeks; may ride as a passenger locally. Wear neck brace at all times except when showering or otherwise instructed. Diet Resume your normal diet.  Return to Work Will be discussed at you follow up appointment. Call Your Doctor If Any of These Occur Redness, drainage, or swelling at the wound.  Temperature greater than 101 degrees. Severe pain not relieved by pain medication. Increased difficulty swallowing.  Incision starts to come apart. Follow Up Appt Call today for appointment in 3 weeks [phone]) or for problems.  If you have any hardware placed in your spine, you will need an x-ray before your appointment.

## 2014-05-10 LAB — GLUCOSE, CAPILLARY
Glucose-Capillary: 133 mg/dL — ABNORMAL HIGH (ref 70–99)
Glucose-Capillary: 229 mg/dL — ABNORMAL HIGH (ref 70–99)

## 2014-05-10 NOTE — Evaluation (Signed)
Physical Therapy Evaluation/ Discharge Patient Details Name: [name] MRN: [id number] DOB: 1945-08-07 Today's Date: 05/10/2014   History of Present Illness  Pt admitted for ACDF C6-7  Clinical Impression  Pt moving well with all education for precautions and restrictions as well as collar wear reviewed, demonstrated and practiced with pt and handout provided. Pt moving slowly related to pain post op but overall at Mod I level and safe for return home with family. Pt verbalized understanding of all education and agreeable to no further needs.     Follow Up Recommendations No PT follow up    Equipment Recommendations  None recommended by PT    Recommendations for Other Services       Precautions / Restrictions Precautions Precautions: Cervical Precaution Booklet Issued: Yes (comment) Required Braces or Orthoses: Cervical Brace Cervical Brace: Soft collar;For comfort      Mobility  Bed Mobility Overal bed mobility: Modified Independent                Transfers Overall transfer level: Modified independent               General transfer comment: pt able to demonstrate after education  Ambulation/Gait Ambulation/Gait assistance: Independent              Stairs Stairs: Yes Stairs assistance: Modified independent (Device/Increase time) Stair Management: One rail Right;Alternating pattern;Forwards Number of Stairs: 6    Wheelchair Mobility    Modified Rankin (Stroke Patients Only)       Balance                                             Pertinent Vitals/Pain Pain Assessment: 0-10 Pain Score: 2  Pain Location: anterior neck and chest Pain Descriptors / Indicators: Aching Pain Intervention(s): Limited activity within patient's tolerance;Repositioned    Home Living Family/patient expects to be discharged to:: Private residence Living Arrangements: Spouse/significant other Available Help at Discharge: Family;Available  24 hours/day Type of Home: House Home Access: Stairs to enter Entrance Stairs-Rails: Right Entrance Stairs-Number of Steps: 6 Home Layout: One level Home Equipment: None      Prior Function Level of Independence: Independent               Hand Dominance        Extremity/Trunk Assessment   Upper Extremity Assessment: Overall WFL for tasks assessed (limited within restrictions)           Lower Extremity Assessment: Overall WFL for tasks assessed      Cervical / Trunk Assessment: Other exceptions  Communication   Communication: No difficulties  Cognition Arousal/Alertness: Awake/alert Behavior During Therapy: WFL for tasks assessed/performed Overall Cognitive Status: Within Functional Limits for tasks assessed                      General Comments      Exercises        Assessment/Plan    PT Assessment Patent does not need any further PT services  PT Diagnosis Acute pain   PT Problem List    PT Treatment Interventions     PT Goals (Current goals can be found in the Care Plan section) Acute Rehab PT Goals PT Goal Formulation: All assessment and education complete, DC therapy    Frequency     Barriers to discharge  Co-evaluation               End of Session Equipment Utilized During Treatment: Cervical collar Activity Tolerance: Patient tolerated treatment well Patient left: in chair;with call bell/phone within reach Nurse Communication: Mobility status;Precautions    Functional Assessment Tool Used: clinical judgement Functional Limitation: Mobility: Walking and moving around Mobility: Walking and Moving Around Current Status ([id number]): At least 1 percent but less than 20 percent impaired, limited or restricted Mobility: Walking and Moving Around Goal Status [phone]([phone]): At least 1 percent but less than 20 percent impaired, limited or restricted Mobility: Walking and Moving Around Discharge Status [phone]): At least 1 percent but  less than 20 percent impaired, limited or restricted    Time: 0739-0755 PT Time Calculation (min): 16 min   Charges:   PT Evaluation $Initial PT Evaluation Tier I: 1 Procedure PT Treatments $Therapeutic Activity: 8-22 mins   PT G Codes:   Functional Assessment Tool Used: clinical judgement Functional Limitation: Mobility: Walking and moving around    Delorse Lekabor, Aedon Deason Beth 05/10/2014, 8:37 AM Delaney MeigsMaija Tabor Kaleb Sek, PT [phone]

## 2014-05-10 NOTE — Discharge Summary (Signed)
Physician Discharge Summary  Patient ID: Janet SimpersMary C [name] MRN: [id number] DOB/AGE: 12/19/1945 68 y.o.  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 The surgery went well.  The patient's postoperative course was unremarkable. She requested discharge home on postoperative day #1. The patient was given oral and written discharge instructions. All her questions were answered.  Consults: PT Significant Diagnostic Studies: None Treatments: C6-7 anterior cervical discectomy, fusion, and plating. Discharge Exam: Blood pressure 138/64, pulse 72, temperature 98.4 F (36.9 C), temperature source Oral, resp. rate 20, weight 87.091 kg (192 lb), SpO2 93.00%. The patient is alert and pleasant. She looks well. Her dressing is clean and dry. There is no hematoma or shift. Her strength is normal in all 4 extremities.  Disposition: Home  Discharge Instructions   Call MD for:  difficulty breathing, headache or visual disturbances    Complete by:  As directed      Call MD for:  extreme fatigue    Complete by:  As directed      Call MD for:  hives    Complete by:  As directed      Call MD for:  persistant dizziness or light-headedness    Complete by:  As directed      Call MD for:  persistant nausea and vomiting    Complete by:  As directed      Call MD for:  redness, tenderness, or signs of infection (pain, swelling, redness, odor or green/yellow discharge around incision site)    Complete by:  As directed      Call MD for:  severe uncontrolled pain    Complete by:  As directed      Call MD for:  temperature >100.4    Complete by:  As directed      Diet - low sodium heart healthy    Complete by:  As directed      Discharge instructions    Complete by:  As directed   Call [phone] for a followup appointment. Take a stool softener while you are using pain medications.     Driving Restrictions    Complete by:  As directed   Do not drive for 2 weeks.     Increase activity slowly    Complete by:  As directed      Lifting restrictions    Complete by:  As directed   Do not lift more than 5 pounds. No excessive bending or twisting.     May shower / Bathe    Complete by:  As directed   He may shower after the pain she is removed 3 days after surgery. Leave the incision alone.     Remove dressing in 48 hours    Complete by:  As directed   Your stitches are under the scan and will dissolve by themselves. The Steri-Strips will fall off after you take a few showers. Do not rub back or pick at the wound, Leave the wound alone.            Medication List         albuterol 108 (90 BASE) MCG/ACT inhaler  Commonly known as:  PROVENTIL HFA;VENTOLIN HFA  Inhale 1-2 puffs into the lungs every 6 (six)  hours as needed for wheezing or shortness of breath.     amlodipine-benazepril 2.5-10 MG per capsule  Commonly known as:  LOTREL  Take 1 capsule by mouth daily.     aspirin EC 81 MG tablet  Take 81 mg by mouth daily.     budesonide-formoterol 160-4.5 MCG/ACT inhaler  Commonly known as:  SYMBICORT  Inhale 2 puffs into the lungs 2 (two) times daily.     diazepam 5 MG tablet  Commonly known as:  VALIUM  Take 1 tablet (5 mg total) by mouth every 6 (six) hours as needed for muscle spasms.     DSS 100 MG Caps  Take 100 mg by mouth 2 (two) times daily.     metFORMIN 500 MG 24 hr tablet  Commonly known as:  GLUCOPHAGE-XR  Take 500-1,000 mg by mouth 2 (two) times daily. Take 500 mg by mouth in the morning and take 1000 mg by mouth at bedtime.     metoprolol succinate 100 MG 24 hr tablet  Commonly known as:  TOPROL-XL  Take 100 mg by  mouth daily. Take with or immediately following a meal.     oxyCODONE-acetaminophen 10-325 MG per tablet  Commonly known as:  PERCOCET  Take 1 tablet by mouth every 4 (four) hours as needed for pain.     Potassium Gluconate 595 MG Caps  Take 595 mg by mouth 6 (six) times daily.     triamterene-hydrochlorothiazide 37.5-25 MG per capsule  Commonly known as:  DYAZIDE  Take 1 capsule by mouth daily.     ULTRA OMEGA 3 PO  Take 1 capsule by mouth daily.           Follow-up Information   Follow up with Cristi LoronJENKINS,Keagan Brislin D, MD.   Specialty:  Neurosurgery   Contact information:   [address] [place] [address] [phone]       Signed: Cristi LoronJENKINS,Alfreida Steffenhagen D 05/10/2014, 7:54 AM

## 2014-05-10 NOTE — Progress Notes (Signed)
Patient alert and oriented, mae's well, voiding adequate amount of urine, swallowing without difficulty, no c/o pain. Patient discharged home with family. Script and discharged instructions given to patient. Patient and family stated understanding of d/c instructions given and has an appointment with MD. Aisha Maranda Marte RN 

## 2014-05-11 ENCOUNTER — Encounter (HOSPITAL_COMMUNITY): Payer: Self-pay | Admitting: Neurosurgery

## 2014-11-04 NOTE — Op Note (Signed)
PATIENT NAME:  [name], [name] MR#:  [id number] DATE OF BIRTH:  08-16-1945  DATE OF PROCEDURE:  09/09/2012  PREOPERATIVE DIAGNOSIS: Umbilical hernia.   POSTOPERATIVE DIAGNOSIS: Umbilical hernia.   PROCEDURE: Umbilical hernia repair.   SURGEON: Renda RollsWilton Smith, M.D.   ANESTHESIA: General.   INDICATIONS: This 69 year old female had protracted coughing in December and had discomfort at the navel with bulging. A hernia was demonstrated on physical exam and repair was recommended for definitive treatment.   DESCRIPTION OF PROCEDURE: The patient was placed on the operating table in the supine position under general anesthesia. The hernia was palpated. It was not reducible at this point and the bulge was approximately 4 cm. The abdomen was prepared with ChloraPrep and draped in a sterile manner. A transversely oriented infraumbilical curvilinear incision was made, carried down through a thin layer of subcutaneous tissue to encounter incarcerated properitoneal fat and omentum. The sac was dissected free from surrounding structures and followed down to the fascial ring defect, which was approximately 3 cm in dimension; however, it was possible to enlarge the incision on the left lateral side to allow reduction of the hernia. The fascia was somewhat thin. 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 An atrial mesh was selected and was cut to create an oval shape of some 2.8 x 4 cm in dimension. This was placed into the properitoneal plane, oriented longitudinally, and was sutured to the fascia above the umbilicus with interrupted 0 Surgilon sutures. Next, it was sutured to the fascia below the defect with through-and-through 0 Surgilon and also was sutured to the fascia bilaterally. Next, the repair was carried out with a transversely oriented suture line of interrupted 0 Surgilon figure-of-eight sutures incorporating mesh into each suture. It is noted  that during the course of the procedure, there was minimal bleeding. Several small bleeding points were cauterized. Hemostasis was subsequently intact. Subcutaneous tissues were closed with interrupted 4-0 Monocryl. The skin was closed with running 4-0 Monocryl subcuticular suture and Dermabond. The patient tolerated the surgery satisfactorily and was then prepared for transfer to the recovery room.  ____________________________ Shela CommonsJ. Renda RollsWilton Smith, MD jws:aw D: 09/09/2012 13:07:18 ET T: 09/09/2012 13:14:43 ET JOB#: [id number]  cc: Adella HareJ. Wilton Smith, MD, <Dictator> Adella HareWILTON J SMITH MD ELECTRONICALLY SIGNED 09/10/2012 17:58

## 2015-06-06 ENCOUNTER — Other Ambulatory Visit: Payer: Self-pay | Admitting: Internal Medicine

## 2015-06-06 DIAGNOSIS — E042 Nontoxic multinodular goiter: Secondary | ICD-10-CM

## 2015-06-09 ENCOUNTER — Ambulatory Visit: Payer: Medicare Other

## 2015-08-01 ENCOUNTER — Other Ambulatory Visit: Payer: Self-pay | Admitting: Obstetrics and Gynecology

## 2015-08-01 DIAGNOSIS — Z1231 Encounter for screening mammogram for malignant neoplasm of breast: Secondary | ICD-10-CM

## 2015-08-03 ENCOUNTER — Ambulatory Visit
Admission: RE | Admit: 2015-08-03 | Discharge: 2015-08-03 | Disposition: A | Discharge status: Home / Self Care | Payer: Medicare Other | Source: Ambulatory Visit | Attending: Obstetrics and Gynecology | Admitting: Obstetrics and Gynecology

## 2015-08-03 DIAGNOSIS — Z1231 Encounter for screening mammogram for malignant neoplasm of breast: Secondary | ICD-10-CM | POA: Insufficient documentation

## 2015-08-07 ENCOUNTER — Other Ambulatory Visit: Payer: Self-pay | Admitting: Obstetrics and Gynecology

## 2015-08-07 DIAGNOSIS — R928 Other abnormal and inconclusive findings on diagnostic imaging of breast: Secondary | ICD-10-CM

## 2015-08-18 ENCOUNTER — Ambulatory Visit
Admission: RE | Admit: 2015-08-18 | Discharge: 2015-08-18 | Disposition: A | Discharge status: Home / Self Care | Payer: Medicare Other | Source: Ambulatory Visit | Attending: Obstetrics and Gynecology | Admitting: Obstetrics and Gynecology

## 2015-08-18 DIAGNOSIS — R928 Other abnormal and inconclusive findings on diagnostic imaging of breast: Secondary | ICD-10-CM

## 2015-08-18 DIAGNOSIS — N63 Unspecified lump in breast: Secondary | ICD-10-CM | POA: Insufficient documentation

## 2015-08-21 ENCOUNTER — Ambulatory Visit
Admission: RE | Admit: 2015-08-21 | Discharge: 2015-08-21 | Disposition: A | Discharge status: Home / Self Care | Payer: Medicare Other | Source: Ambulatory Visit | Attending: Internal Medicine | Admitting: Internal Medicine

## 2015-08-21 DIAGNOSIS — E042 Nontoxic multinodular goiter: Secondary | ICD-10-CM | POA: Diagnosis present

## 2016-06-27 ENCOUNTER — Other Ambulatory Visit: Payer: Self-pay | Admitting: Specialist

## 2016-06-27 DIAGNOSIS — J439 Emphysema, unspecified: Secondary | ICD-10-CM

## 2016-06-27 DIAGNOSIS — R0609 Other forms of dyspnea: Secondary | ICD-10-CM

## 2016-07-02 ENCOUNTER — Ambulatory Visit
Admission: RE | Admit: 2016-07-02 | Discharge: 2016-07-02 | Disposition: A | Discharge status: Home / Self Care | Payer: Medicare Other | Source: Ambulatory Visit | Attending: Specialist | Admitting: Specialist

## 2016-07-02 DIAGNOSIS — M5134 Other intervertebral disc degeneration, thoracic region: Secondary | ICD-10-CM | POA: Insufficient documentation

## 2016-07-02 DIAGNOSIS — R0609 Other forms of dyspnea: Secondary | ICD-10-CM | POA: Diagnosis present

## 2016-07-02 DIAGNOSIS — I700 Atherosclerosis of aorta: Secondary | ICD-10-CM | POA: Insufficient documentation

## 2016-07-02 DIAGNOSIS — J439 Emphysema, unspecified: Secondary | ICD-10-CM | POA: Insufficient documentation

## 2016-07-02 DIAGNOSIS — R911 Solitary pulmonary nodule: Secondary | ICD-10-CM | POA: Insufficient documentation

## 2016-07-02 DIAGNOSIS — I2510 Atherosclerotic heart disease of native coronary artery without angina pectoris: Secondary | ICD-10-CM | POA: Diagnosis not present

## 2016-07-04 ENCOUNTER — Other Ambulatory Visit: Payer: Self-pay | Admitting: Specialist

## 2016-07-04 DIAGNOSIS — R918 Other nonspecific abnormal finding of lung field: Secondary | ICD-10-CM

## 2016-08-06 ENCOUNTER — Other Ambulatory Visit: Payer: Self-pay | Admitting: Obstetrics and Gynecology

## 2016-08-06 DIAGNOSIS — Z1231 Encounter for screening mammogram for malignant neoplasm of breast: Secondary | ICD-10-CM

## 2016-08-20 ENCOUNTER — Ambulatory Visit: Admission: RE | Admit: 2016-08-20 | Payer: Medicare Other | Source: Ambulatory Visit

## 2016-08-27 ENCOUNTER — Other Ambulatory Visit: Payer: Self-pay | Admitting: Obstetrics and Gynecology

## 2016-08-27 ENCOUNTER — Ambulatory Visit
Admission: RE | Admit: 2016-08-27 | Discharge: 2016-08-27 | Disposition: A | Discharge status: Home / Self Care | Payer: Medicare Other | Source: Ambulatory Visit | Attending: Obstetrics and Gynecology | Admitting: Obstetrics and Gynecology

## 2016-08-27 DIAGNOSIS — Z1231 Encounter for screening mammogram for malignant neoplasm of breast: Secondary | ICD-10-CM | POA: Diagnosis present

## 2016-09-09 ENCOUNTER — Other Ambulatory Visit: Payer: Self-pay | Admitting: Nurse Practitioner

## 2016-09-09 DIAGNOSIS — S80811A Abrasion, right lower leg, initial encounter: Secondary | ICD-10-CM

## 2016-09-09 DIAGNOSIS — S8011XA Contusion of right lower leg, initial encounter: Secondary | ICD-10-CM

## 2016-09-09 DIAGNOSIS — R600 Localized edema: Secondary | ICD-10-CM

## 2016-09-12 ENCOUNTER — Ambulatory Visit: Payer: Medicare Other | Admitting: Surgery

## 2016-09-13 ENCOUNTER — Encounter: Payer: Medicare Other | Attending: Surgery | Admitting: Surgery

## 2016-09-13 DIAGNOSIS — F17218 Nicotine dependence, cigarettes, with other nicotine-induced disorders: Secondary | ICD-10-CM | POA: Insufficient documentation

## 2016-09-13 DIAGNOSIS — Z881 Allergy status to other antibiotic agents status: Secondary | ICD-10-CM | POA: Insufficient documentation

## 2016-09-13 DIAGNOSIS — Z7982 Long term (current) use of aspirin: Secondary | ICD-10-CM | POA: Insufficient documentation

## 2016-09-13 DIAGNOSIS — Z79899 Other long term (current) drug therapy: Secondary | ICD-10-CM | POA: Diagnosis not present

## 2016-09-13 DIAGNOSIS — Z7984 Long term (current) use of oral hypoglycemic drugs: Secondary | ICD-10-CM | POA: Diagnosis not present

## 2016-09-13 DIAGNOSIS — E11622 Type 2 diabetes mellitus with other skin ulcer: Secondary | ICD-10-CM | POA: Diagnosis not present

## 2016-09-13 DIAGNOSIS — J449 Chronic obstructive pulmonary disease, unspecified: Secondary | ICD-10-CM | POA: Diagnosis not present

## 2016-09-13 DIAGNOSIS — L97212 Non-pressure chronic ulcer of right calf with fat layer exposed: Secondary | ICD-10-CM | POA: Insufficient documentation

## 2016-09-13 DIAGNOSIS — Z886 Allergy status to analgesic agent status: Secondary | ICD-10-CM | POA: Insufficient documentation

## 2016-09-13 DIAGNOSIS — I10 Essential (primary) hypertension: Secondary | ICD-10-CM | POA: Insufficient documentation

## 2016-09-13 DIAGNOSIS — M545 Low back pain: Secondary | ICD-10-CM | POA: Diagnosis not present

## 2016-09-13 DIAGNOSIS — E039 Hypothyroidism, unspecified: Secondary | ICD-10-CM | POA: Diagnosis not present

## 2016-09-13 DIAGNOSIS — M70861 Other soft tissue disorders related to use, overuse and pressure, right lower leg: Secondary | ICD-10-CM | POA: Insufficient documentation

## 2016-09-13 DIAGNOSIS — G8929 Other chronic pain: Secondary | ICD-10-CM | POA: Insufficient documentation

## 2016-09-13 DIAGNOSIS — I471 Supraventricular tachycardia: Secondary | ICD-10-CM | POA: Diagnosis not present

## 2016-09-14 NOTE — Progress Notes (Signed)
SARALYN, WILLISON ([id number]) Visit Report for 09/13/2016 Abuse/Suicide Risk Screen Details [name], [name] Date of Service: 09/13/2016 8:00 AM Medical Record Number: [id number] Patient Account Number: [id number] Date of Birth/Sex: [birth year] (70 y.o. Female) Treating RN: Curtis Sites Primary Care Bella Brummet: Milon Score Other Clinician: Referring Makai Dumond: Treating Jaiven Graveline/Extender: Rudene Re in Treatment: 0 Abuse/Suicide Risk Screen Items Answer ABUSE/SUICIDE RISK SCREEN: Has anyone close to you tried to hurt or harm you recentlyo No Do you feel uncomfortable with anyone in your familyo No Has anyone forced you do things that you didnot want to doo No Do you have any thoughts of harming yourselfo No Patient displays signs or symptoms of abuse and/or neglect. No Electronic Signature(s) Signed: 09/13/2016 4:52:07 PM By: Curtis Sites Entered By: Curtis Sites on 09/13/2016 08:12:27 [name] ([id number]) -------------------------------------------------------------------------------- Activities of Daily Living Details [name] Date of Service: 09/13/2016 8:00 AM Medical Record Number: [id number] Patient Account Number: [id number] Date of Birth/Sex: 02/24/1946 (70 y.o. Female) Treating RN: Curtis Sites Primary Care Georganna Maxson: Milon Score Other Clinician: Referring Shakiera Edelson: Treating Keiandre Cygan/Extender: Rudene Re in Treatment: 0 Activities of Daily Living Items Answer Activities of Daily Living (Please select one for each item) Drive Automobile Completely Able Take Medications Completely Able Use Telephone Completely Able Care for Appearance Completely Able Use Toilet Completely Able Bath / Shower Completely Able Dress Self Completely Able Feed Self Completely Able Walk Completely Able Get In / Out Bed Completely Able Housework Completely Able Prepare Meals Completely Able Handle Money Completely Able Shop for  Self Completely Able Electronic Signature(s) Signed: 09/13/2016 4:52:07 PM By: Curtis Sites Entered By: Curtis Sites on 09/13/2016 08:12:42 [name] ([id number]) -------------------------------------------------------------------------------- Education Assessment Details [name] Date of Service: 09/13/2016 8:00 AM Medical Record Number: [id number] Patient Account Number: [id number] Date of Birth/Sex: 05/01/1946 (70 y.o. Female) Treating RN: Curtis Sites Primary Care Carley Glendenning: Milon Score Other Clinician: Referring Regana Kemple: Treating Sameria Morss/Extender: Rudene Re in Treatment: 0 Primary Learner Assessed: Patient Learning Preferences/Education Level/Primary Language Learning Preference: Explanation, Demonstration Highest Education Level: College or Above Preferred Language: English Cognitive Barrier Assessment/Beliefs Language Barrier: No Translator Needed: No Memory Deficit: No Emotional Barrier: No Cultural/Religious Beliefs Affecting Medical No Care: Physical Barrier Assessment Impaired Vision: No Impaired Hearing: No Decreased Hand dexterity: No Knowledge/Comprehension Assessment Knowledge Level: Medium Comprehension Level: Medium Ability to understand written Medium instructions: Ability to understand verbal Medium instructions: Motivation Assessment Anxiety Level: Calm Cooperation: Cooperative Education Importance: Acknowledges Need Interest in Health Problems: Asks Questions Perception: Coherent Willingness to Engage in Self- Medium Management Activities: Readiness to Engage in Self- Medium Management Activities: Electronic Signature(s) ERVA, KOKE ([id number]) Signed: 09/13/2016 4:52:07 PM By: Curtis Sites Entered By: Curtis Sites on 09/13/2016 08:13:02 KELLYN, MCCARY ([id number]) -------------------------------------------------------------------------------- Fall Risk Assessment Details Patient Name:  [name] Date of Service: 09/13/2016 8:00 AM Medical Record Number: [id number] Patient Account Number: [id number] Date of Birth/Sex: 19-Nov-1945 (71 y.o. Female) Treating RN: Curtis Sites Primary Care Galen Malkowski: Milon Score Other Clinician: Referring Genesis Novosad: Treating Yasenia Reedy/Extender: Rudene Re in Treatment: 0 Fall Risk Assessment Items Have you had 2 or more falls in the last 12 monthso 0 No Have you had any fall that resulted in injury in the last 12 monthso 0 No FALL RISK ASSESSMENT: History of falling - immediate or within 3 months 0 No Secondary diagnosis 0 No Ambulatory aid None/bed rest/wheelchair/nurse 0 Yes Crutches/cane/walker 0 No Furniture 0 No IV Access/Saline Lock 0 No Gait/Training  Normal/bed rest/immobile 0 Yes Weak 0 No Impaired 0 No Mental Status Oriented to own ability 0 Yes Electronic Signature(s) Signed: 09/13/2016 4:52:07 PM By: Curtis Sitesorthy, Joanna Entered By: Curtis Sitesorthy, Joanna on 09/13/2016 08:13:23 [name], [name] ([id number]) -------------------------------------------------------------------------------- Foot Assessment Details [name], [name] Date of Service: 09/13/2016 8:00 AM Medical Record Number: [id number] Patient Account Number: [id number] Date of Birth/Sex: July 29, 1945 (70 y.o. Female) Treating RN: Curtis Sitesorthy, Joanna Primary Care Davionne Dowty: Milon ScoreJONES, CARON Other Clinician: Referring Tanaysia Bhardwaj: Treating Camesha Farooq/Extender: Rudene ReBritto, Errol Weeks in Treatment: 0 Foot Assessment Items Site Locations + = Sensation present, - = Sensation absent, C = Callus, U = Ulcer R = Redness, W = Warmth, M = Maceration, PU = Pre-ulcerative lesion F = Fissure, S = Swelling, D = Dryness Assessment Right: Left: Other Deformity: No No Prior Foot Ulcer: No No Prior Amputation: No No Charcot Joint: No No Ambulatory Status: Ambulatory Without Help Gait: Steady Electronic Signature(s) Signed: 09/13/2016 4:52:07 PM By: Curtis Sitesorthy, Joanna Entered By:  Curtis Sitesorthy, Joanna on 09/13/2016 08:13:52 [name], [name] ([id number]) -------------------------------------------------------------------------------- Nutrition Risk Assessment Details [name], [name] Date of Service: 09/13/2016 8:00 AM Medical Record Number: [id number] Patient Account Number: [id number] Date of Birth/Sex: July 29, 1945 (70 y.o. Female) Treating RN: Curtis Sitesorthy, Joanna Primary Care Cayla Wiegand: Milon ScoreJONES, CARON Other Clinician: Referring Reginald Mangels: Treating Eliza Grissinger/Extender: Rudene ReBritto, Errol Weeks in Treatment: 0 Height (in): 67 Weight (lbs): 197 Body Mass Index (BMI): 30.9 Nutrition Risk Assessment Items NUTRITION RISK SCREEN: I have an illness or condition that made me change the kind and/or 0 No amount of food I eat I eat fewer than two meals per day 0 No I eat few fruits and vegetables, or milk products 0 No I have three or more drinks of beer, liquor or wine almost every day 0 No I have tooth or mouth problems that make it hard for me to eat 0 No I don't always have enough money to buy the food I need 0 No I eat alone most of the time 0 No I take three or more different prescribed or over-the-counter drugs a 1 Yes day Without wanting to, I have lost or gained 10 pounds in the last six 0 No months I am not always physically able to shop, cook and/or feed myself 0 No Nutrition Protocols Good Risk Protocol 0 No interventions needed Moderate Risk Protocol Electronic Signature(s) Signed: 09/13/2016 4:52:07 PM By: Curtis Sitesorthy, Joanna Entered By: Curtis Sitesorthy, Joanna on 09/13/2016 08:13:30

## 2016-09-14 NOTE — Progress Notes (Signed)
Janet SimpersLANGLEY, [name] ([id number]) Visit Report for 09/13/2016 Allergy List Details Patient Name: Janet SimpersLANGLEY, [name] Date of Service: 09/13/2016 8:00 AM Medical Record Number: [id number] Patient Account Number: [id number] Date of Birth/Sex: Dec 09, 1945 (70 y.o. Female) Treating RN: Curtis Sitesorthy, Joanna Primary Care Mekia Dipinto: Milon ScoreJONES, CARON Other Clinician: Referring Roza Creamer: Treating Tiyona Desouza/Extender: Rudene ReBritto, Errol Weeks in Treatment: 0 Allergies Active Allergies tramadol erythromycin estolate Allergy Notes Electronic Signature(s) Signed: 09/13/2016 4:52:07 PM By: Curtis Sitesorthy, Joanna Entered By: Curtis Sitesorthy, Joanna on 09/13/2016 08:12:16 Janet SimpersLANGLEY, [name] ([id number]) -------------------------------------------------------------------------------- Arrival Information Details Patient Name: Janet SimpersLANGLEY, [name] Date of Service: 09/13/2016 8:00 AM Medical Record Number: [id number] Patient Account Number: [id number] Date of Birth/Sex: Dec 09, 1945 [age] y.o. Female) Treating RN: Curtis Sitesorthy, Joanna Primary Care Chanika Byland: Milon ScoreJONES, CARON Other Clinician: Referring Dariyah Garduno: Treating Aleeya Veitch/Extender: Rudene ReBritto, Errol Weeks in Treatment: 0 Visit Information Patient Arrived: Ambulatory Arrival Time: 08:07 Accompanied By: son Transfer Assistance: None Patient Identification Verified: Yes Secondary Verification Process Yes Completed: Patient Has Alerts: Yes Patient Alerts: DMII Electronic Signature(s) Signed: 09/13/2016 4:52:07 PM By: Curtis Sitesorthy, Joanna Entered By: Curtis Sitesorthy, Joanna on 09/13/2016 08:08:10 Janet SimpersLANGLEY, [name] ([id number]) -------------------------------------------------------------------------------- Clinic Level of Care Assessment Details Patient Name: Janet SimpersLANGLEY, [name] Date of Service: 09/13/2016 8:00 AM Medical Record Number: [id number] Patient Account Number: [id number] Date of Birth/Sex: Dec 09, 1945 (70 y.o. Female) Treating RN: Curtis Sitesorthy, Joanna Primary Care Persephanie Laatsch: Milon ScoreJONES, CARON Other Clinician: Referring  Merrisa Skorupski: Treating Amarise Lillo/Extender: Rudene ReBritto, Errol Weeks in Treatment: 0 Clinic Level of Care Assessment Items TOOL 1 Quantity Score []  - Use when EandM and Procedure is performed on INITIAL visit 0 ASSESSMENTS - Nursing Assessment / Reassessment X - General Physical Exam (combine w/ comprehensive assessment (listed just 1 20 below) when performed on new pt. evals) X - Comprehensive Assessment (HX, ROS, Risk Assessments, Wounds Hx, etc.) 1 25 ASSESSMENTS - Wound and Skin Assessment / Reassessment []  - Dermatologic / Skin Assessment (not related to wound area) 0 ASSESSMENTS - Ostomy and/or Continence Assessment and Care []  - Incontinence Assessment and Management 0 []  - Ostomy Care Assessment and Management (repouching, etc.) 0 PROCESS - Coordination of Care X - Simple Patient / Family Education for ongoing care 1 15 []  - Complex (extensive) Patient / Family Education for ongoing care 0 X - Staff obtains ChiropractorConsents, Records, Test Results / Process Orders 1 10 []  - Staff telephones HHA, Nursing Homes / Clarify orders / etc 0 []  - Routine Transfer to another Facility (non-emergent condition) 0 []  - Routine Hospital Admission (non-emergent condition) 0 X - New Admissions / Manufacturing engineernsurance Authorizations / Ordering NPWT, Apligraf, etc. 1 15 []  - Emergency Hospital Admission (emergent condition) 0 PROCESS - Special Needs []  - Pediatric / Minor Patient Management 0 []  - Isolation Patient Management 0 [name], [name] ([id number]) []  - Hearing / Language / Visual special needs 0 []  - Assessment of Community assistance (transportation, D/C planning, etc.) 0 []  - Additional assistance / Altered mentation 0 []  - Support Surface(s) Assessment (bed, cushion, seat, etc.) 0 INTERVENTIONS - Miscellaneous []  - External ear exam 0 []  - Patient Transfer (multiple staff / Nurse, adultHoyer Lift / Similar devices) 0 []  - Simple Staple / Suture removal (25 or less) 0 []  - Complex Staple / Suture removal (26 or more)  0 []  - Hypo/Hyperglycemic Management (do not check if billed separately) 0 X - Ankle / Brachial Index (ABI) - do not check if billed separately 1 15 Has the patient been seen at the hospital within the last three years: Yes Total Score: 100 Level Of Care: New/Established - Level  3 Electronic Signature(s) Signed: 09/13/2016 4:52:07 PM By: Curtis Sites Entered By: Curtis Sites on 09/13/2016 08:56:15 [name] ([id number]) -------------------------------------------------------------------------------- Encounter Discharge Information Details [name] Date of Service: 09/13/2016 8:00 AM Medical Record Number: [id number] Patient Account Number: [id number] Date of Birth/Sex: 17-Aug-1945 (70 y.o. Female) Treating RN: Curtis Sites Primary Care Nyaisha Simao: Milon Score Other Clinician: Referring Muath Hallam: Treating Rakin Lemelle/Extender: Rudene Re in Treatment: 0 Encounter Discharge Information Items Discharge Pain Level: 0 Discharge Condition: Stable Ambulatory Status: Ambulatory Discharge Destination: Home Private Transportation: Auto Accompanied By: son Schedule Follow-up Appointment: Yes Medication Reconciliation completed and No provided to Patient/Care Brent Noto: Clinical Summary of Care: Electronic Signature(s) Signed: 09/13/2016 4:52:07 PM By: Curtis Sites Entered By: Curtis Sites on 09/13/2016 08:45:45 [name] ([id number]) -------------------------------------------------------------------------------- Lower Extremity Assessment Details [name] Date of Service: 09/13/2016 8:00 AM Medical Record Number: [id number] Patient Account Number: [id number] Date of Birth/Sex: 1945-10-31 (70 y.o. Female) Treating RN: Curtis Sites Primary Care Copper Basnett: Milon Score Other Clinician: Referring Shemicka Cohrs: Treating Doss Cybulski/Extender: Rudene Re in Treatment: 0 Edema Assessment Assessed: [Left: No] [Right:  No] Edema: [Left: Yes] [Right: Yes] Calf Left: Right: Point of Measurement: 32 cm From Medial Instep 38.9 cm 37.5 cm Ankle Left: Right: Point of Measurement: 10 cm From Medial Instep 25 cm 24.5 cm Vascular Assessment Pulses: Dorsalis Pedis Palpable: [Left:Yes] [Right:Yes] Doppler Audible: [Left:Yes] [Right:Yes] Posterior Tibial Palpable: [Left:Yes] [Right:Yes] Doppler Audible: [Left:Yes] [Right:Yes] Extremity colors, hair growth, and conditions: Extremity Color: [Left:Hyperpigmented] [Right:Hyperpigmented] Hair Growth on Extremity: [Left:Yes] [Right:Yes] Temperature of Extremity: [Left:Warm] [Right:Warm] Capillary Refill: [Left:< 3 seconds] [Right:< 3 seconds] Blood Pressure: Brachial: [Right:142] Dorsalis Pedis: [Left:Dorsalis Pedis: 162] Ankle: Posterior Tibial: [Left:Posterior Tibial: 148] [Right:1.14] Toe Nail Assessment Left: Right: Thick: Yes Yes Discolored: No No Deformed: No No Improper Length and Hygiene: No No [name], [name] ([id number]) Electronic Signature(s) Signed: 09/13/2016 4:52:07 PM By: Curtis Sites Entered By: Curtis Sites on 09/13/2016 08:35:02 [name] ([id number]) -------------------------------------------------------------------------------- Multi Wound Chart Details [name] Date of Service: 09/13/2016 8:00 AM Medical Record Number: [id number] Patient Account Number: [id number] Date of Birth/Sex: 1946-05-09 (70 y.o. Female) Treating RN: Curtis Sites Primary Care Iverson Sees: Milon Score Other Clinician: Referring Angeline Trick: Treating Korde Jeppsen/Extender: Rudene Re in Treatment: 0 Vital Signs Height(in): 67 Pulse(bpm): 72 Weight(lbs): 197 Blood Pressure 148/63 (mmHg): Body Mass Index(BMI): 31 Temperature(F): 98.0 Respiratory Rate 18 (breaths/min): Photos: [1:No Photos] [N/A:N/A] Wound Location: [1:Right Lower Leg - Anterior, Proximal] [N/A:N/A] Wounding Event: [1:Trauma] [N/A:N/A] Primary  Etiology: [1:Trauma, Other] [N/A:N/A] Comorbid History: [1:Chronic Obstructive Pulmonary Disease (COPD), Hypertension, Type II Diabetes] [N/A:N/A] Date Acquired: [1:08/17/2016] [N/A:N/A] Weeks of Treatment: [1:0] [N/A:N/A] Wound Status: [1:Open] [N/A:N/A] Measurements L x W x D 3.1x1.5x0.1 [N/A:N/A] (cm) Area (cm) : [1:3.652] [N/A:N/A] Volume (cm) : [1:0.365] [N/A:N/A] Classification: [1:Full Thickness Without Exposed Support Structures] [N/A:N/A] HBO Classification: [1:Grade 1] [N/A:N/A] Exudate Amount: [1:Large] [N/A:N/A] Exudate Type: [1:Serous] [N/A:N/A] Exudate Color: [1:amber] [N/A:N/A] Wound Margin: [1:Flat and Intact] [N/A:N/A] Granulation Amount: [1:None Present (0%)] [N/A:N/A] Necrotic Amount: [1:Large (67-100%)] [N/A:N/A] Necrotic Tissue: [1:Eschar] [N/A:N/A] Exposed Structures: [1:Fascia: No Fat Layer (Subcutaneous Tissue) Exposed: No] [N/A:N/A] Tendon: No Muscle: No Joint: No Bone: No Limited to Skin Breakdown Epithelialization: None N/A N/A Debridement: Debridement ([id number]- N/A N/A [id number]) Pre-procedure 08:47 N/A N/A Verification/Time Out Taken: Pain Control: Lidocaine 4% Topical N/A N/A Solution Tissue Debrided: Necrotic/Eschar, N/A N/A Fibrin/Slough, Blood Clots, Subcutaneous Level: Skin/Subcutaneous N/A N/A Tissue Debridement Area (sq 4.65 N/A N/A cm): Instrument: Forceps, Scissors N/A N/A Bleeding:  Minimum N/A N/A Hemostasis Achieved: Pressure N/A N/A Procedural Pain: 5 N/A N/A Post Procedural Pain: 0 N/A N/A Debridement Treatment Procedure was tolerated N/A N/A Response: well Post Debridement 3.1x1.5x0.5 N/A N/A Measurements L x W x D (cm) Post Debridement 1.826 N/A N/A Volume: (cm) Periwound Skin Texture: Excoriation: No N/A N/A Induration: No Callus: No Crepitus: No Rash: No Scarring: No Periwound Skin Maceration: No N/A N/A Moisture: Dry/Scaly: No Periwound Skin Color: Erythema: Yes N/A N/A Atrophie Blanche: No Cyanosis:  No Ecchymosis: No Hemosiderin Staining: No Mottled: No Pallor: No Rubor: No Erythema Location: Circumferential N/A N/A Stetler, [name] ([id number]) Temperature: No Abnormality N/A N/A Tenderness on Yes N/A N/A Palpation: Wound Preparation: Ulcer Cleansing: N/A N/A Rinsed/Irrigated with Saline Topical Anesthetic Applied: Other: lidocaine 4% Procedures Performed: Debridement N/A N/A Treatment Notes Wound #1 (Right, Proximal, Anterior Lower Leg) 1. Cleansed with: Clean wound with Normal Saline 2. Anesthetic Topical Lidocaine 4% cream to wound bed prior to debridement 4. Dressing Applied: Aquacel Ag 5. Secondary Dressing Applied Bordered Foam Dressing Dry Gauze Electronic Signature(s) Signed: 09/13/2016 9:03:35 AM By: Evlyn Kanner MD, FACS Entered By: Evlyn Kanner on 09/13/2016 09:03:35 [name], [name] ([id number]) -------------------------------------------------------------------------------- Multi-Disciplinary Care Plan Details [name] Date of Service: 09/13/2016 8:00 AM Medical Record Number: [id number] Patient Account Number: [id number] Date of Birth/Sex: 1945/10/15 (70 y.o. Female) Treating RN: Curtis Sites Primary Care Aliciana Ricciardi: Milon Score Other Clinician: Referring Winter Jocelyn: Treating Gaila Engebretsen/Extender: Rudene Re in Treatment: 0 Active Inactive ` Necrotic Tissue Nursing Diagnoses: Impaired tissue integrity related to necrotic/devitalized tissue Goals: Necrotic/devitalized tissue will be minimized in the wound bed Date Initiated: 09/13/2016 Target Resolution Date: 10/18/2016 Goal Status: Active Interventions: Assess patient pain level pre-, during and post procedure and prior to discharge Notes: ` Orientation to the Wound Care Program Nursing Diagnoses: Knowledge deficit related to the wound healing center program Goals: Patient/caregiver will verbalize understanding of the Wound Healing Center Program Date Initiated:  09/13/2016 Target Resolution Date: 12/20/2016 Goal Status: Active Interventions: Provide education on orientation to the wound center Notes: ` Wound/Skin Impairment Nursing Diagnoses: Impaired tissue integrity [name], [name] ([id number]) Goals: Patient/caregiver will verbalize understanding of skin care regimen Date Initiated: 09/13/2016 Target Resolution Date: 12/20/2016 Goal Status: Active Ulcer/skin breakdown will have a volume reduction of 30% by week 4 Date Initiated: 09/13/2016 Target Resolution Date: 12/20/2016 Goal Status: Active Ulcer/skin breakdown will have a volume reduction of 50% by week 8 Date Initiated: 09/13/2016 Target Resolution Date: 12/20/2016 Goal Status: Active Ulcer/skin breakdown will have a volume reduction of 80% by week 12 Date Initiated: 09/13/2016 Target Resolution Date: 12/20/2016 Goal Status: Active Ulcer/skin breakdown will heal within 14 weeks Date Initiated: 09/13/2016 Target Resolution Date: 12/20/2016 Goal Status: Active Interventions: Assess patient/caregiver ability to obtain necessary supplies Assess patient/caregiver ability to perform ulcer/skin care regimen upon admission and as needed Assess ulceration(s) every visit Notes: Electronic Signature(s) Signed: 09/13/2016 4:52:07 PM By: Curtis Sites Entered By: Curtis Sites on 09/13/2016 08:44:46 [name] ([id number]) -------------------------------------------------------------------------------- Pain Assessment Details [name] Date of Service: 09/13/2016 8:00 AM Medical Record Number: [id number] Patient Account Number: [id number] Date of Birth/Sex: 1945/07/23 (70 y.o. Female) Treating RN: Curtis Sites Primary Care Noorah Giammona: Milon Score Other Clinician: Referring Saylor Sheckler: Treating Ghazi Rumpf/Extender: Rudene Re in Treatment: 0 Active Problems Location of Pain Severity and Description of Pain Patient Has Paino Yes Site Locations Pain Management and  Medication Current Pain Management: Notes Topical or injectable lidocaine is offered to patient for acute pain  when surgical debridement is performed. If needed, Patient is instructed to use over the counter pain medication for the following 24-48 hours after debridement. Wound care MDs do not prescribed pain medications. Patient has chronic pain or uncontrolled pain. Patient has been instructed to make an appointment with their Primary Care Physician for pain management. Electronic Signature(s) Signed: 09/13/2016 4:52:07 PM By: Curtis Sites Entered By: Curtis Sites on 09/13/2016 08:08:33 [name] ([id number]) -------------------------------------------------------------------------------- Patient/Caregiver Education Details [name] Date of Service: 09/13/2016 8:00 AM Medical Record Number: [id number] Patient Account Number: [id number] Date of Birth/Gender: 1946/04/19 (70 y.o. Female) Treating RN: Curtis Sites Primary Care Physician: Milon Score Other Clinician: Referring Physician: Treating Physician/Extender: Rudene Re in Treatment: 0 Education Assessment Education Provided To: Patient Education Topics Provided Wound/Skin Impairment: Handouts: Other: wound care as ordered Methods: Demonstration, Explain/Verbal Responses: State content correctly Electronic Signature(s) Signed: 09/13/2016 4:52:07 PM By: Curtis Sites Entered By: Curtis Sites on 09/13/2016 08:46:08 [name] ([id number]) -------------------------------------------------------------------------------- Wound Assessment Details [name] Date of Service: 09/13/2016 8:00 AM Medical Record Number: [id number] Patient Account Number: [id number] Date of Birth/Sex: 03/30/1946 (70 y.o. Female) Treating RN: Curtis Sites Primary Care Farzana Koci: Milon Score Other Clinician: Referring Niketa Turner: Treating Caylon Saine/Extender: Rudene Re in  Treatment: 0 Wound Status Wound Number: 1 Primary Trauma, Other Etiology: Wound Location: Right Lower Leg - Anterior, Proximal Wound Open Status: Wounding Event: Trauma Comorbid Chronic Obstructive Pulmonary Date Acquired: 08/17/2016 History: Disease (COPD), Hypertension, Type Weeks Of Treatment: 0 II Diabetes Clustered Wound: No Photos Photo Uploaded By: Curtis Sites on 09/13/2016 14:16:25 Wound Measurements Length: (cm) 3.1 % Reduction i Width: (cm) 1.5 % Reduction i Depth: (cm) 0.1 Epithelializa Area: (cm) 3.652 Tunneling: Volume: (cm) 0.365 Undermining: n Area: n Volume: tion: None No No Wound Description Full Thickness Without Foul Odor Aft Classification: Exposed Support Structures Slough/Fibrin Diabetic Severity Grade 1 (Wagner): Wound Margin: Flat and Intact Exudate Amount: Large Exudate Type: Serous Exudate Color: amber er Cleansing: No o No Wound Bed Granulation Amount: None Present (0%) Exposed Structure Bastarache, Maniyah C. ([id number]) Necrotic Amount: Large (67-100%) Fascia Exposed: No Necrotic Quality: Eschar Fat Layer (Subcutaneous Tissue) Exposed: No Tendon Exposed: No Muscle Exposed: No Joint Exposed: No Bone Exposed: No Limited to Skin Breakdown Periwound Skin Texture Texture Color No Abnormalities Noted: No No Abnormalities Noted: No Callus: No Atrophie Blanche: No Crepitus: No Cyanosis: No Excoriation: No Ecchymosis: No Induration: No Erythema: Yes Rash: No Erythema Location: Circumferential Scarring: No Hemosiderin Staining: No Mottled: No Moisture Pallor: No No Abnormalities Noted: No Rubor: No Dry / Scaly: No Maceration: No Temperature / Pain Temperature: No Abnormality Tenderness on Palpation: Yes Wound Preparation Ulcer Cleansing: Rinsed/Irrigated with Saline Topical Anesthetic Applied: Other: lidocaine 4%, Treatment Notes Wound #1 (Right, Proximal, Anterior Lower Leg) 1. Cleansed with: Clean wound with  Normal Saline 2. Anesthetic Topical Lidocaine 4% cream to wound bed prior to debridement 4. Dressing Applied: Aquacel Ag 5. Secondary Dressing Applied Bordered Foam Dressing Dry Gauze Electronic Signature(s) Signed: 09/13/2016 4:52:07 PM By: Curtis Sites Entered By: Curtis Sites on 09/13/2016 24:40:10 [name] ([id number]) -------------------------------------------------------------------------------- Vitals Details [name] Date of Service: 09/13/2016 8:00 AM Medical Record Number: [id number] Patient Account Number: [id number] Date of Birth/Sex: 1946-02-04 (70 y.o. Female) Treating RN: Curtis Sites Primary Care Xiao Graul: Milon Score Other Clinician: Referring Carolyne Whitsel: Treating Reise Hietala/Extender: Rudene Re in Treatment: 0 Vital Signs Time Taken: 08:08 Temperature (F): 98.0 Height (in): 67 Pulse (bpm): 72 Source:  Measured Respiratory Rate (breaths/min): 18 Weight (lbs): 197 Blood Pressure (mmHg): 148/63 Source: Measured Reference Range: 80 - 120 mg / dl Body Mass Index (BMI): 30.9 Electronic Signature(s) Signed: 09/13/2016 4:52:07 PM By: Curtis Sites Entered By: Curtis Sites on 09/13/2016 08:11:04

## 2016-09-14 NOTE — Progress Notes (Addendum)
[name], [name] ([id number]) Visit Report for 09/13/2016 Chief Complaint Document Details [name], [name] Date of Service: 09/13/2016 8:00 AM Medical Record Number: [id number] Patient Account Number: [id number] Date of Birth/Sex: Jun 28, 1946 (70 y.o. Female) Treating RN: [name] Primary Care Provider: Milon [name] Other Clinician: Referring Provider: Treating Provider/Extender: [name] in Treatment: 0 Information Obtained from: Patient Chief Complaint Patients presents for treatment of an open diabetic ulcer due to a injury to her right lower extremity about 4 weeks Electronic Signature(s) Signed: 09/13/2016 9:04:13 AM By: Janet Kanner MD, FACS Entered By: [name] on 09/13/2016 09:04:13 [name] ([id number]) -------------------------------------------------------------------------------- Debridement Details [name] Date of Service: 09/13/2016 8:00 AM Medical Record Number: [id number] Patient Account Number: [id number] Date of Birth/Sex: [birth year] (70 y.o. Female) Treating RN: [name] Primary Care Provider: Milon [name] Other Clinician: Referring Provider: Treating Provider/Extender: [name] in Treatment: 0 Debridement Performed for Wound #1 Right,Proximal,Anterior Lower Leg Assessment: Performed By: Physician Janet Kanner, MD Debridement: Debridement Pre-procedure Yes - 08:47 Verification/Time Out Taken: Start Time: 08:47 Pain Control: Lidocaine 4% Topical Solution Level: Skin/Subcutaneous Tissue Total Area Debrided (L x 3.1 (cm) x 1.5 (cm) = 4.65 (cm) W): Tissue and other Viable, Non-Viable, Blood Clots, Eschar, Fibrin/Slough, Subcutaneous material debrided: Instrument: Forceps, Scissors Bleeding: Minimum Hemostasis Achieved: Pressure End Time: 08:53 Procedural Pain: 5 Post Procedural Pain: 0 Response to Treatment: Procedure was tolerated well Post Debridement Measurements of Total  Wound Length: (cm) 3.1 Width: (cm) 1.5 Depth: (cm) 0.5 Volume: (cm) 1.826 Character of Wound/Ulcer Post Improved Debridement: Severity of Tissue Post Debridement: Fat layer exposed Post Procedure Diagnosis Same as Pre-procedure Electronic Signature(s) Signed: 09/13/2016 9:03:51 AM By: Janet Kanner MD, FACS Signed: 09/13/2016 4:52:07 PM By: [name] Entered By: [name] on 09/13/2016 09:03:51 [name], [name] ([id number] [name], [name] ([id number]) -------------------------------------------------------------------------------- HPI Details [name] Date of Service: 09/13/2016 8:00 AM Medical Record Number: [id number] Patient Account Number: [id number] Date of Birth/Sex: [birth year] (70 y.o. Female) Treating RN: [name] Primary Care Provider: Milon [name] Other Clinician: Referring Provider: Treating Provider/Extender: [name] in Treatment: 0 History of Present Illness Location: right lower extremity in the area of the lateral calf below the knee Quality: Patient reports experiencing a sharp pain to affected area(s). Severity: Patient states wound are getting worse. Duration: Patient has had the wound for < 4 weeks prior to presenting for treatment Timing: Pain in wound is constant (hurts all the time) Context: The wound occurred when the patient a blunt injury and fall Modifying Factors: Other treatment(s) tried include:courses of doxycycline and local Bactroban ointment Associated Signs and Symptoms: Patient reports having increase discharge. HPI Description: 71 year old patient has been referred to as with right lower extremity wound with edema and ecchymosis, after having a injury due to a fall on 08/20/2016. she has a history of diabetes mellitus type 2 which is uncomplicated, chronic back pain, COPD, hypertension, hypothyroidism, supraventricular tachycardia and tobacco abuse. He is status post neck surgery, appendectomy,  cholecystectomy, hernia repair. He smokes about half a pack of cigarettes a day for the last 50 years. Recently on doxycycline for 10 days and was given symptomatic treatment for wound Electronic Signature(s) Signed: 09/13/2016 9:05:25 AM By: Janet Kanner MD, FACS Previous Signature: 09/13/2016 8:22:14 AM Version By: Janet Kanner MD, FACS Previous Signature: 09/13/2016 8:12:15 AM Version By: Janet Kanner MD, FACS Entered By: [name] on 09/13/2016 09:05:25 [name] ([id number]) -------------------------------------------------------------------------------- Physical Exam Details  [name], [name] Date of Service: 09/13/2016 8:00 AM Medical Record Number: [id number] Patient Account Number: [id number] Date of Birth/Sex: 1945-12-31 (70 y.o. Female) Treating RN: [name] Primary Care Provider: Milon [name] Other Clinician: Referring Provider: Treating Provider/Extender: [name] in Treatment: 0 Constitutional . Pulse regular. Respirations normal and unlabored. Afebrile. . Eyes Nonicteric. Reactive to light. Ears, Nose, Mouth, and Throat Lips, teeth, and gums WNL.Marland Kitchen Moist mucosa without lesions. Neck supple and nontender. No palpable supraclavicular or cervical adenopathy. Normal sized without goiter. Respiratory WNL. No retractions.. Cardiovascular Pedal Pulses WNL. B on the right was 1.14. No clubbing, cyanosis or edema.Marland Kitchen Lymphatic No adneopathy. No adenopathy. No adenopathy. Musculoskeletal Adexa without tenderness or enlargement.. Digits and nails w/o clubbing, cyanosis, infection, petechiae, ischemia, or inflammatory conditions.. Integumentary (Hair, Skin) No suspicious lesions. No crepitus or fluctuance. No peri-wound warmth or erythema. No masses.Marland Kitchen Psychiatric Judgement and insight Intact.. No evidence of depression, anxiety, or agitation.. Notes about 6 inches below her right knee in the area of the soft tissue lateral to the bone she  has a hematoma with a large eschar covering it. Using sharp dissection with a forcep and scissors the eschar was removed the hematoma evacuated and the area washed out with moist saline gauze. Electronic Signature(s) Signed: 09/13/2016 9:06:55 AM By: Janet Kanner MD, FACS Entered By: [name] on 09/13/2016 09:06:55 [name], [name] ([id number]) -------------------------------------------------------------------------------- Physician Orders Details [name] Date of Service: 09/13/2016 8:00 AM Medical Record Number: [id number] Patient Account Number: [id number] Date of Birth/Sex: 1945-11-23 (70 y.o. Female) Treating RN: [name] Primary Care Provider: Milon [name] Other Clinician: Referring Provider: Treating Provider/Extender: [name] in Treatment: 0 Verbal / Phone Orders: No Diagnosis Coding Wound Cleansing Wound #1 Right,Proximal,Anterior Lower Leg o Clean wound with Normal Saline. o May Shower, gently pat wound dry prior to applying new dressing. Anesthetic Wound #1 Right,Proximal,Anterior Lower Leg o Topical Lidocaine 4% cream applied to wound bed prior to debridement Primary Wound Dressing Wound #1 Right,Proximal,Anterior Lower Leg o Aquacel Ag Secondary Dressing Wound #1 Right,Proximal,Anterior Lower Leg o Boardered Foam Dressing - or telfa island dressing Dressing Change Frequency Wound #1 Right,Proximal,Anterior Lower Leg o Change dressing every day. Follow-up Appointments Wound #1 Right,Proximal,Anterior Lower Leg o Return Appointment in 1 week. Edema Control Wound #1 Right,Proximal,Anterior Lower Leg o Elevate legs to the level of the heart and pump ankles as often as possible Additional Orders / Instructions Wound #1 Right,Proximal,Anterior Lower Leg o Stop Smoking o Increase protein intake. o Other: - Please try to keep blood sugars below 180 [name], [name] ([id number]) Electronic  Signature(s) Signed: 09/13/2016 3:38:42 PM By: Janet Kanner MD, FACS Entered By: [name] on 09/13/2016 09:07:10 [name] ([id number]) -------------------------------------------------------------------------------- Problem List Details [name] Date of Service: 09/13/2016 8:00 AM Medical Record Number: [id number] Patient Account Number: [id number] Date of Birth/Sex: [birth year] (70 y.o. 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 Female) Treating RN: [name] Primary Care Provider: Milon [name] Other Clinician: Referring Provider: Treating Provider/Extender: [name] in Treatment: 0 Subjective Chief Complaint Information obtained from Patient Patients presents for treatment of an open diabetic ulcer due to a injury to her right lower extremity about 4 weeks History of Present Illness (HPI) The following HPI elements were  documented for the patient's wound: Location: right lower extremity in the area of the lateral calf below the knee Quality: Patient reports experiencing a sharp pain to affected area(s). Severity: Patient states wound are getting worse. Duration: Patient has had the wound for < 4 weeks prior to presenting for treatment Timing: Pain in wound is constant (hurts all the time) Context: The wound occurred when the patient a blunt injury and fall Modifying Factors: Other treatment(s) tried include:courses of doxycycline and local Bactroban ointment Associated Signs and Symptoms: Patient reports having increase discharge. 71 year old patient has been referred to as with right lower extremity wound with edema and ecchymosis, after having a injury due to a fall on 08/20/2016. she has a history of diabetes mellitus type 2 which is uncomplicated, chronic back pain, COPD, hypertension, hypothyroidism, supraventricular tachycardia and tobacco abuse. He is status post neck surgery, appendectomy, cholecystectomy, hernia repair. He smokes about half a pack of cigarettes a day for the last 50 years. Recently on doxycycline for 10 days and was given symptomatic treatment for wound Wound History Patient presents with 1 open wound that has been present for approximately 4 weeks. Patient has been treating wound in the following manner: bacitracin. Laboratory tests have not been performed in the last month. Patient reportedly has not tested positive for an antibiotic resistant organism. Patient reportedly has not tested positive for osteomyelitis. Patient reportedly has not had testing performed to evaluate circulation in the legs. Patient experiences the following problems associated with their wounds: infection. Patient History Information obtained from Patient. Allergies tramadol, erythromycin estolate [name], [name] ([id number]) Social History Former smoker, Marital Status - Married, Alcohol Use - Never,  Drug Use - No History, Caffeine Use - Daily. Medical History Eyes Denies history of Cataracts, Glaucoma, Optic Neuritis Ear/Nose/Mouth/Throat Denies history of Chronic sinus problems/congestion, Middle ear problems Hematologic/Lymphatic Denies history of Anemia, Hemophilia, Human Immunodeficiency Virus, Lymphedema, Sickle Cell Disease Respiratory Patient has history of Chronic Obstructive Pulmonary Disease (COPD) Denies history of Aspiration, Asthma, Pneumothorax, Sleep Apnea, Tuberculosis Cardiovascular Patient has history of Hypertension Denies history of Angina, Arrhythmia, Congestive Heart Failure, Coronary Artery Disease, Deep Vein Thrombosis, Hypotension, Myocardial Infarction, Peripheral Arterial Disease, Peripheral Venous Disease, Phlebitis, Vasculitis Gastrointestinal Denies history of Cirrhosis , Colitis, Crohn s, Hepatitis A, Hepatitis B, Hepatitis C Endocrine Patient has history of Type II Diabetes Denies history of Type I Diabetes Immunological Denies history of Lupus Erythematosus, Raynaud s, Scleroderma Integumentary (Skin) Denies history of History of Burn, History of pressure wounds Musculoskeletal Denies history of Gout, Rheumatoid Arthritis, Osteoarthritis, Osteomyelitis Neurologic Denies history of Dementia, Neuropathy, Quadriplegia, Paraplegia, Seizure Disorder Oncologic Denies history of Received Chemotherapy, Received Radiation Patient is treated with Oral Agents. Medical And Surgical History Notes Cardiovascular hx SVT - followed by Dr Lady Gary Review of Systems (ROS) Constitutional Symptoms (General Health) The patient has no complaints or symptoms. Eyes Complains or has symptoms of Glasses / Contacts - glasses. Ear/Nose/Mouth/Throat  The patient has no complaints or symptoms. Hematologic/Lymphatic The patient has no complaints or symptoms. Respiratory The patient has no complaints or symptoms. [name], [name] ([id number]) Cardiovascular The patient  has no complaints or symptoms. Gastrointestinal The patient has no complaints or symptoms. Endocrine The patient has no complaints or symptoms. Genitourinary The patient has no complaints or symptoms. Immunological The patient has no complaints or symptoms. Integumentary (Skin) The patient has no complaints or symptoms. Musculoskeletal The patient has no complaints or symptoms. Neurologic The patient has no complaints or symptoms. Oncologic The patient has no complaints or symptoms. Psychiatric The patient has no complaints or symptoms. Medications ergocalciferol (vitamin D2) oral tablet oral Lotrel oral tablet oral metformin 500 mg tablet oral tablet oral methimazole 5 mg tablet oral tablet oral albuterol sulfate HFA 90 mcg/actuation aerosol inhaler inhalation HFA aerosol inhaler inhalation Symbicort 160 mcg-4.5 mcg/actuation HFA aerosol inhaler inhalation HFA aerosol inhaler inhalation metoprolol succinate ER 100 mg tablet,extended release 24 hr oral tablet extended release 24 hr oral metoprolol tartrate 25 mg tablet oral tablet oral omega-3 fatty acids-fish oil 340 mg-1,000 mg capsule oral capsule oral aspirin 81 mg tablet,delayed release oral tablet,delayed release (DR/EC) oral potassium gluconate 595 mg (99 mg) tablet oral 6 6 tablet oral once daily triamterene 37.5 mg-hydrochlorothiazide 25 mg capsule oral capsule oral cyanocobalamin (vit B-12) 1,000 mcg tablet oral tablet oral Objective Constitutional Pulse regular. Respirations normal and unlabored. Afebrile. Vitals Time Taken: 8:08 AM, Height: 67 in, Source: Measured, Weight: 197 lbs, Source: Measured, BMI: [name], [name] ([id number]) 30.9, Temperature: 98.0 F, Pulse: 72 bpm, Respiratory Rate: 18 breaths/min, Blood Pressure: 148/63 mmHg. Eyes Nonicteric. Reactive to light. Ears, Nose, Mouth, and Throat Lips, teeth, and gums WNL.Marland Kitchen Moist mucosa without lesions. Neck supple and nontender. No palpable  supraclavicular or cervical adenopathy. Normal sized without goiter. Respiratory WNL. No retractions.. Cardiovascular Pedal Pulses WNL. B on the right was 1.14. No clubbing, cyanosis or edema.Marland Kitchen Lymphatic No adneopathy. No adenopathy. No adenopathy. Musculoskeletal Adexa without tenderness or enlargement.. Digits and nails w/o clubbing, cyanosis, infection, petechiae, ischemia, or inflammatory conditions.Marland Kitchen Psychiatric Judgement and insight Intact.. No evidence of depression, anxiety, or agitation.. General Notes: about 6 inches below her right knee in the area of the soft tissue lateral to the bone she has a hematoma with a large eschar covering it. Using sharp dissection with a forcep and scissors the eschar was removed the hematoma evacuated and the area washed out with moist saline gauze. Integumentary (Hair, Skin) No suspicious lesions. No crepitus or fluctuance. No peri-wound warmth or erythema. No masses.. Wound #1 status is Open. Original cause of wound was Trauma. The wound is located on the Right,Proximal,Anterior Lower Leg. The wound measures 3.1cm length x 1.5cm width x 0.1cm depth; 3.652cm^2 area and 0.365cm^3 volume. The wound is limited to skin breakdown. There is no tunneling or undermining noted. There is a large amount of serous drainage noted. The wound margin is flat and intact. There is no granulation within the wound bed. There is a large (67-100%) amount of necrotic tissue within the wound bed including Eschar. The periwound skin appearance exhibited: Erythema. The periwound skin appearance did not exhibit: Callus, Crepitus, Excoriation, Induration, Rash, Scarring, Dry/Scaly, Maceration, Atrophie Blanche, Cyanosis, Ecchymosis, Hemosiderin Staining, Mottled, Pallor, Rubor. The surrounding wound skin color is noted with erythema which is circumferential. Periwound temperature was noted as No Abnormality. The periwound has tenderness on palpation. 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 Daily packing with silver alginate and covered with a border foam. 2. good control of her diabetes mellitus 3. adequate protein, vitamin C, vitamin A and zinc 4. I have spent 3 minutes discussing with her the need to completely give up smoking and have discussed the risks benefits alternatives and she says she will work on this 5. regular review at the wound center. Procedures Wound #1 Wound #1 is a Trauma, Other located on the Right,Proximal,Anterior Lower Leg . There was a Skin/Subcutaneous Tissue Debridement ([id number]) debridement with total area of 4.65 sq cm performed by Janet Kanner, MD. with the following instrument(s): Forceps and Scissors to remove Viable and Non-Viable tissue/material including Blood Clots, Fibrin/Slough, Eschar, and Subcutaneous after achieving pain control using Lidocaine 4% Topical Solution. A time out was conducted at 08:47, prior to the start of the procedure. A Minimum amount of bleeding was controlled with Pressure. The procedure was tolerated well with a pain level of 5 throughout and a pain level of 0 following the procedure. Post Debridement Measurements: 3.1cm length x 1.5cm width x 0.5cm depth; 1.826cm^3 volume. Character of Wound/Ulcer Post Debridement is improved. Severity of Tissue Post Debridement is: Fat layer exposed. Post procedure Diagnosis Wound #1: Same as Pre-Procedure Plan Wound Cleansing: Wound #1 Right,Proximal,Anterior Lower Leg: [name], [name] ([id number]) Clean wound with  Normal Saline. May Shower, gently pat wound dry prior to applying new dressing. Anesthetic: Wound #1 Right,Proximal,Anterior Lower Leg: Topical Lidocaine 4% cream applied to wound bed prior to debridement Primary Wound Dressing: Wound #1 Right,Proximal,Anterior Lower Leg: Aquacel Ag Secondary Dressing: Wound #1 Right,Proximal,Anterior Lower Leg: Boardered Foam Dressing - or telfa island dressing Dressing Change Frequency: Wound #1 Right,Proximal,Anterior Lower Leg: Change dressing every day. Follow-up Appointments: Wound #1 Right,Proximal,Anterior Lower Leg: Return Appointment in 1 week. Edema Control: Wound #1 Right,Proximal,Anterior Lower Leg: Elevate legs to the level of the heart and pump ankles as often as possible Additional Orders / Instructions: Wound #1 Right,Proximal,Anterior Lower Leg: Stop Smoking Increase protein intake. Other: - Please try to keep blood sugars below 80 this 71 year old diabetic had a fairly large hematoma evacuated today and after review I have recommended: 1. Daily packing with silver alginate and covered with a border foam. 2. good control of her diabetes mellitus 3. adequate protein, vitamin C, vitamin A and zinc 4. I have spent 3 minutes discussing with her the need to completely give up smoking and have discussed the risks benefits alternatives and she says she will work on this 5. regular review at the wound center. Electronic Signature(s) Signed: 09/13/2016 9:09:29 AM By: Janet Kanner MD, FACS Entered By: [name] on 09/13/2016 09:09:29 [name] ([id number]) -------------------------------------------------------------------------------- ROS/PFSH Details [name] Date of Service: 09/13/2016 8:00 AM Medical Record Number: [id number] Patient Account Number: [id number] Date of Birth/Sex: 1945/07/29 (70 y.o. Female) Treating RN: [name] Primary Care Provider: Milon [name] Other Clinician: Referring  Provider: Treating Provider/Extender: [name] in Treatment: 0 Information Obtained From Patient Wound History Do you currently have one or more open woundso Yes How many open wounds do you currently haveo 1 Approximately how long have you had your woundso 4 weeks How have  you been treating your wound(s) until nowo bacitracin Has your wound(s) ever healed and then [name]-openedo No Have you had any lab work done in the past montho No Have you tested positive for an antibiotic resistant organism (MRSA, VRE)o No Have you tested positive for osteomyelitis (bone infection)o No Have you had any tests for circulation on your legso No Have you had other problems associated with your woundso Infection Eyes Complaints and Symptoms: Positive for: Glasses / Contacts - glasses Medical History: Negative for: Cataracts; Glaucoma; Optic Neuritis Constitutional Symptoms (General Health) Complaints and Symptoms: No Complaints or Symptoms Ear/Nose/Mouth/Throat Complaints and Symptoms: No Complaints or Symptoms Medical History: Negative for: Chronic sinus problems/congestion; Middle ear problems Hematologic/Lymphatic Complaints and Symptoms: No Complaints or Symptoms Medical HistoryKathi [name]: Radermacher, Leinaala C. ([id number]) Negative for: Anemia; Hemophilia; Human Immunodeficiency Virus; Lymphedema; Sickle Cell Disease Respiratory Complaints and Symptoms: No Complaints or Symptoms Medical History: Positive for: Chronic Obstructive Pulmonary Disease (COPD) Negative for: Aspiration; Asthma; Pneumothorax; Sleep Apnea; Tuberculosis Cardiovascular Complaints and Symptoms: No Complaints or Symptoms Medical History: Positive for: Hypertension Negative for: Angina; Arrhythmia; Congestive Heart Failure; Coronary Artery Disease; Deep Vein Thrombosis; Hypotension; Myocardial Infarction; Peripheral Arterial Disease; Peripheral Venous Disease; Phlebitis; Vasculitis Past Medical History Notes: hx SVT -  followed by Dr Lady GaryFath Gastrointestinal Complaints and Symptoms: No Complaints or Symptoms Medical History: Negative for: Cirrhosis ; Colitis; Crohnos; Hepatitis A; Hepatitis B; Hepatitis C Endocrine Complaints and Symptoms: No Complaints or Symptoms Medical History: Positive for: Type II Diabetes Negative for: Type I Diabetes Time with diabetes: 10 years Treated with: Oral agents Genitourinary Complaints and Symptoms: No Complaints or Symptoms Immunological Lipson, Yolinda C. ([id number]) Complaints and Symptoms: No Complaints or Symptoms Medical History: Negative for: Lupus Erythematosus; Raynaudos; Scleroderma Integumentary (Skin) Complaints and Symptoms: No Complaints or Symptoms Medical History: Negative for: History of Burn; History of pressure wounds Musculoskeletal Complaints and Symptoms: No Complaints or Symptoms Medical History: Negative for: Gout; Rheumatoid Arthritis; Osteoarthritis; Osteomyelitis Neurologic Complaints and Symptoms: No Complaints or Symptoms Medical History: Negative for: Dementia; Neuropathy; Quadriplegia; Paraplegia; Seizure Disorder Oncologic Complaints and Symptoms: No Complaints or Symptoms Medical History: Negative for: Received Chemotherapy; Received Radiation Psychiatric Complaints and Symptoms: No Complaints or Symptoms Immunizations Pneumococcal Vaccine: Received Pneumococcal Vaccination: Yes Immunization Notes: up to date Family and Social History Janet SimpersLANGLEY, Blaire C. ([id number]) Former smoker; Marital Status - Married; Alcohol Use: Never; Drug Use: No History; Caffeine Use: Daily; Financial Concerns: No; Food, Clothing or Shelter Needs: No; Support System Lacking: No; Transportation Concerns: No; Advanced Directives: No; Patient does not want information on Advanced Directives Physician Affirmation I have reviewed and agree with the above information. Electronic Signature(s) Signed: 09/13/2016 3:38:42 PM By: Janet KannerBritto, Kaytlan Behrman MD,  FACS Signed: 09/13/2016 4:52:07 PM By: Janet Sitesorthy, Joanna Entered By: Janet KannerBritto, Eufemia Prindle on 09/13/2016 09:01:31 Janet SimpersLANGLEY, Romie C. ([id number]) -------------------------------------------------------------------------------- SuperBill Details Patient Name: Janet SimpersLANGLEY, Austine C. Date of Service: 09/13/2016 Medical Record Number: [id number] Patient Account Number: [id number] Date of Birth/Sex: 1945-08-18 (70 y.o. Female) Treating RN: Janet Sitesorthy, Joanna Primary Care Provider: Milon ScoreJONES, CARON Other Clinician: Referring Provider: Treating Provider/Extender: Janet KannerBritto, Genesi Stefanko Service Line: Outpatient Weeks in Treatment: 0 Diagnosis Coding ICD-10 Codes Code Description E11.622 Type 2 diabetes mellitus with other skin ulcer M70.861 Other soft tissue disorders related to use, overuse and pressure, right lower leg L97.212 Non-pressure chronic ulcer of right calf with fat layer exposed F17.218 Nicotine dependence, cigarettes, with other nicotine-induced disorders Facility Procedures CPT4: Description Modifier Quantity Code [id number] 99213 - WOUND CARE VISIT-LEV 3 EST PT 1 CPT4: 6433295136100012 11042 -  DEB SUBQ TISSUE 20 SQ CM/< 1 ICD-10 Description Diagnosis E11.622 Type 2 diabetes mellitus with other skin ulcer M70.861 Other soft tissue disorders related to use, overuse and pressure, right lower leg L97.212 Non-pressure chronic  ulcer of right calf with fat layer exposed CPT4: 16109604 99406-SMOKING CESSATION 3-10MINS 1 ICD-10 Description Diagnosis E11.622 Type 2 diabetes mellitus with other skin ulcer F17.218 Nicotine dependence, cigarettes, with other nicotine-induced disorders Physician Procedures CPT4: Description Modifier Quantity Code 5409811 99204 - WC PHYS LEVEL 4 - NEW PT 25 1 ICD-10 Description Diagnosis E11.622 Type 2 diabetes mellitus with other skin ulcer M70.861 AMERE, IOTT ([id number]) Electronic Signature(s) Signed: 09/13/2016 9:09:59 AM By: Janet Kanner MD, FACS Entered By: [name] on 09/13/2016 09:09:59

## 2016-09-16 ENCOUNTER — Ambulatory Visit: Payer: Medicare Other

## 2016-09-20 ENCOUNTER — Encounter: Payer: Medicare Other | Admitting: Surgery

## 2016-09-20 DIAGNOSIS — E11622 Type 2 diabetes mellitus with other skin ulcer: Secondary | ICD-10-CM | POA: Diagnosis not present

## 2016-09-21 NOTE — Progress Notes (Signed)
MAANVI, LECOMPTE ([id number]) Visit Report for 09/20/2016 Arrival Information Details [name], [name] Date of Service: 09/20/2016 12:30 PM Medical Record Number: [id number] Patient Account Number: [id number] Date of Birth/Sex: 1945-11-07 (70 y.o. Female) Treating RN: Curtis Sites Primary Care Khamora Karan: Milon Score Other Clinician: Referring Quinton Voth: Treating Nakeem Murnane/Extender: Rudene Re in Treatment: 1 Visit Information History Since Last Visit Added or deleted any medications: No Patient Arrived: Ambulatory Any new allergies or adverse reactions: No Arrival Time: 12:35 Had a fall or experienced change in No Accompanied By: dtr activities of daily living that may affect Transfer Assistance: None risk of falls: Patient Identification Verified: Yes Signs or symptoms of abuse/neglect since last No Secondary Verification Process Yes visito Completed: Hospitalized since last visit: No Patient Has Alerts: Yes Has Dressing in Place as Prescribed: Yes Patient Alerts: DMII Pain Present Now: No Electronic Signature(s) Signed: 09/20/2016 5:14:48 PM By: Curtis Sites Entered By: Curtis Sites on 09/20/2016 12:37:12 [name] ([id number]) -------------------------------------------------------------------------------- Clinic Level of Care Assessment Details [name] Date of Service: 09/20/2016 12:30 PM Medical Record Number: [id number] Patient Account Number: [id number] Date of Birth/Sex: 03/10/1946 (70 y.o. Female) Treating RN: Curtis Sites Primary Care Westlyn Glaza: Milon Score Other Clinician: Referring Hunner Garcon: Treating Yarrow Linhart/Extender: Rudene Re in Treatment: 1 Clinic Level of Care Assessment Items TOOL 4 Quantity Score []  - Use when only an EandM is performed on FOLLOW-UP visit 0 ASSESSMENTS - Nursing Assessment / Reassessment X - Reassessment of Co-morbidities (includes updates in patient status) 1 10 X -  Reassessment of Adherence to Treatment Plan 1 5 ASSESSMENTS - Wound and Skin Assessment / Reassessment X - Simple Wound Assessment / Reassessment - one wound 1 5 []  - Complex Wound Assessment / Reassessment - multiple wounds 0 []  - Dermatologic / Skin Assessment (not related to wound area) 0 ASSESSMENTS - Focused Assessment []  - Circumferential Edema Measurements - multi extremities 0 []  - Nutritional Assessment / Counseling / Intervention 0 X - Lower Extremity Assessment (monofilament, tuning fork, pulses) 1 5 []  - Peripheral Arterial Disease Assessment (using hand held doppler) 0 ASSESSMENTS - Ostomy and/or Continence Assessment and Care []  - Incontinence Assessment and Management 0 []  - Ostomy Care Assessment and Management (repouching, etc.) 0 PROCESS - Coordination of Care X - Simple Patient / Family Education for ongoing care 1 15 []  - Complex (extensive) Patient / Family Education for ongoing care 0 []  - Staff obtains Chiropractor, Records, Test Results / Process Orders 0 []  - Staff telephones HHA, Nursing Homes / Clarify orders / etc 0 []  - Routine Transfer to another Facility (non-emergent condition) 0 Shaddock, Lavada C. ([id number]) []  - Routine Hospital Admission (non-emergent condition) 0 []  - New Admissions / Manufacturing engineer / Ordering NPWT, Apligraf, etc. 0 []  - Emergency Hospital Admission (emergent condition) 0 X - Simple Discharge Coordination 1 10 []  - Complex (extensive) Discharge Coordination 0 PROCESS - Special Needs []  - Pediatric / Minor Patient Management 0 []  - Isolation Patient Management 0 []  - Hearing / Language / Visual special needs 0 []  - Assessment of Community assistance (transportation, D/C planning, etc.) 0 []  - Additional assistance / Altered mentation 0 []  - Support Surface(s) Assessment (bed, cushion, seat, etc.) 0 INTERVENTIONS - Wound Cleansing / Measurement X - Simple Wound Cleansing - one wound 1 5 []  - Complex Wound Cleansing - multiple  wounds 0 X - Wound Imaging (photographs - any number of wounds) 1 5 []  - Wound Tracing (instead of photographs) 0  X - Simple Wound Measurement - one wound 1 5 []  - Complex Wound Measurement - multiple wounds 0 INTERVENTIONS - Wound Dressings X - Small Wound Dressing one or multiple wounds 1 10 []  - Medium Wound Dressing one or multiple wounds 0 []  - Large Wound Dressing one or multiple wounds 0 []  - Application of Medications - topical 0 []  - Application of Medications - injection 0 INTERVENTIONS - Miscellaneous []  - External ear exam 0 Wilmes, Cire C. ([id number]) []  - Specimen Collection (cultures, biopsies, blood, body fluids, etc.) 0 []  - Specimen(s) / Culture(s) sent or taken to Lab for analysis 0 []  - Patient Transfer (multiple staff / Michiel Sites Lift / Similar devices) 0 []  - Simple Staple / Suture removal (25 or less) 0 []  - Complex Staple / Suture removal (26 or more) 0 []  - Hypo / Hyperglycemic Management (close monitor of Blood Glucose) 0 []  - Ankle / Brachial Index (ABI) - do not check if billed separately 0 X - Vital Signs 1 5 Has the patient been seen at the hospital within the last three years: Yes Total Score: 80 Level Of Care: New/Established - Level 3 Electronic Signature(s) Signed: 09/20/2016 5:14:48 PM By: Curtis Sites Entered By: Curtis Sites on 09/20/2016 13:08:42 [name] ([id number]) -------------------------------------------------------------------------------- Encounter Discharge Information Details [name] Date of Service: 09/20/2016 12:30 PM Medical Record Number: [id number] Patient Account Number: [id number] Date of Birth/Sex: 16-Sep-1945 (71 y.o. Female) Treating RN: Curtis Sites Primary Care Kratos Ruscitti: Milon Score Other Clinician: Referring Gerell Fortson: Treating London Nonaka/Extender: Rudene Re in Treatment: 1 Encounter Discharge Information Items Discharge Pain Level: 0 Discharge Condition: Stable Ambulatory  Status: Ambulatory Discharge Destination: Home Transportation: Private Auto Accompanied By: self Schedule Follow-up Appointment: Yes Medication Reconciliation completed and provided to Patient/Care No Davionna Blacksher: Provided on Clinical Summary of Care: 09/20/2016 Form Type Recipient Paper Patient ML Electronic Signature(s) Signed: 09/20/2016 1:18:02 PM By: Gwenlyn Perking Entered By: Gwenlyn Perking on 09/20/2016 13:18:02 [name] ([id number]) -------------------------------------------------------------------------------- Lower Extremity Assessment Details [name] Date of Service: 09/20/2016 12:30 PM Medical Record Number: [id number] Patient Account Number: [id number] Date of Birth/Sex: 1946/01/12 (70 y.o. Female) Treating RN: Curtis Sites Primary Care Mimi Debellis: Milon Score Other Clinician: Referring Kazim Corrales: Treating Mansour Balboa/Extender: Rudene Re in Treatment: 1 Edema Assessment Assessed: [Left: No] [Right: No] Edema: [Left: N] [Right: o] Vascular Assessment Pulses: Dorsalis Pedis Palpable: [Right:Yes] Posterior Tibial Extremity colors, hair growth, and conditions: Extremity Color: [Right:Hyperpigmented] Hair Growth on Extremity: [Right:No] Temperature of Extremity: [Right:Warm] Capillary Refill: [Right:< 3 seconds] Electronic Signature(s) Signed: 09/20/2016 5:14:48 PM By: Curtis Sites Entered By: Curtis Sites on 09/20/2016 12:49:36 [name] ([id number]) -------------------------------------------------------------------------------- Multi Wound Chart Details [name] Date of Service: 09/20/2016 12:30 PM Medical Record Number: [id number] Patient Account Number: [id number] Date of Birth/Sex: [birth year] (70 y.o. Female) Treating RN: Curtis Sites Primary Care Nerine Pulse: Milon Score Other Clinician: Referring Trustin Chapa: Treating Kataleena Holsapple/Extender: Rudene Re in Treatment: 1 Vital Signs Height(in):  67 Pulse(bpm): 75 Weight(lbs): 197 Blood Pressure 154/64 (mmHg): Body Mass Index(BMI): 31 Temperature(F): 97.9 Respiratory Rate 18 (breaths/min): Photos: [N/A:N/A] Wound Location: Right Lower Leg - N/A N/A Anterior, Proximal Wounding Event: Trauma N/A N/A Primary Etiology: Trauma, Other N/A N/A Comorbid History: Chronic Obstructive N/A N/A Pulmonary Disease (COPD), Hypertension, Type II Diabetes Date Acquired: 08/17/2016 N/A N/A Weeks of Treatment: 1 N/A N/A Wound Status: Open N/A N/A Measurements L x W x D 2.3x1.7x0.3 N/A N/A (cm) Area (cm) : 3.071 N/A N/A  Volume (cm) : 0.921 N/A N/A % Reduction in Area: 15.90% N/A N/A % Reduction in Volume: -152.30% N/A N/A Classification: Full Thickness Without N/A N/A Exposed Support Structures HBO Classification: Grade 1 N/A N/A Exudate Amount: Large N/A N/A Exudate Type: Serous N/A N/A Sanjurjo, [name] ([id number]) Exudate Color: amber N/A N/A Wound Margin: Flat and Intact N/A N/A Granulation Amount: Small (1-33%) N/A N/A Granulation Quality: Red N/A N/A Necrotic Amount: Large (67-100%) N/A N/A Necrotic Tissue: Eschar N/A N/A Exposed Structures: Fascia: No N/A N/A Fat Layer (Subcutaneous Tissue) Exposed: No Tendon: No Muscle: No Joint: No Bone: No Limited to Skin Breakdown Epithelialization: None N/A N/A Periwound Skin Texture: Excoriation: No N/A N/A Induration: No Callus: No Crepitus: No Rash: No Scarring: No Periwound Skin Maceration: No N/A N/A Moisture: Dry/Scaly: No Periwound Skin Color: Erythema: Yes N/A N/A Atrophie Blanche: No Cyanosis: No Ecchymosis: No Hemosiderin Staining: No Mottled: No Pallor: No Rubor: No Erythema Location: Circumferential N/A N/A Temperature: No Abnormality N/A N/A Tenderness on Yes N/A N/A Palpation: Wound Preparation: Ulcer Cleansing: N/A N/A Rinsed/Irrigated with Saline Topical Anesthetic Applied: Other: lidocaine 4% Treatment Notes Wound #1 (Right, Proximal,  Anterior Lower Leg) 1. Cleansed with: Clean wound with Normal Saline 2. Anesthetic Pawlicki, Verneda C. ([id number]) Topical Lidocaine 4% cream to wound bed prior to debridement 4. Dressing Applied: Aquacel Ag 5. Secondary Dressing Applied Bordered Foam Dressing Dry Gauze Electronic Signature(s) Signed: 09/20/2016 1:20:03 PM By: Evlyn KannerBritto, Errol MD, FACS Entered By: Evlyn KannerBritto, Errol on 09/20/2016 13:20:03 [name], [name] ([id number]) -------------------------------------------------------------------------------- Multi-Disciplinary Care Plan Details [name], Smt. C. Date of Service: 09/20/2016 12:30 PM Medical Record Number: [id number] Patient Account Number: [id number] Date of Birth/Sex: 05/10/1946 (70 y.o. Female) Treating RN: Curtis Sitesorthy, Joanna Primary Care Poppy Mcafee: Milon ScoreJONES, CARON Other Clinician: Referring Caidyn Henricksen: Treating Pansy Ostrovsky/Extender: Rudene ReBritto, Errol Weeks in Treatment: 1 Active Inactive ` Necrotic Tissue Nursing Diagnoses: Impaired tissue integrity related to necrotic/devitalized tissue Goals: Necrotic/devitalized tissue will be minimized in the wound bed Date Initiated: 09/13/2016 Target Resolution Date: 10/18/2016 Goal Status: Active Interventions: Assess patient pain level pre-, during and post procedure and prior to discharge Notes: ` Orientation to the Wound Care Program Nursing Diagnoses: Knowledge deficit related to the wound healing center program Goals: Patient/caregiver will verbalize understanding of the Wound Healing Center Program Date Initiated: 09/13/2016 Target Resolution Date: 12/20/2016 Goal Status: Active Interventions: Provide education on orientation to the wound center Notes: ` Wound/Skin Impairment Nursing Diagnoses: Impaired tissue integrity [name], [name] ([id number]) Goals: Patient/caregiver will verbalize understanding of skin care regimen Date Initiated: 09/13/2016 Target Resolution Date: 12/20/2016 Goal Status: Active Ulcer/skin  breakdown will have a volume reduction of 30% by week 4 Date Initiated: 09/13/2016 Target Resolution Date: 12/20/2016 Goal Status: Active Ulcer/skin breakdown will have a volume reduction of 50% by week 8 Date Initiated: 09/13/2016 Target Resolution Date: 12/20/2016 Goal Status: Active Ulcer/skin breakdown will have a volume reduction of 80% by week 12 Date Initiated: 09/13/2016 Target Resolution Date: 12/20/2016 Goal Status: Active Ulcer/skin breakdown will heal within 14 weeks Date Initiated: 09/13/2016 Target Resolution Date: 12/20/2016 Goal Status: Active Interventions: Assess patient/caregiver ability to obtain necessary supplies Assess patient/caregiver ability to perform ulcer/skin care regimen upon admission and as needed Assess ulceration(s) every visit Notes: Electronic Signature(s) Signed: 09/20/2016 5:14:48 PM By: Curtis Sitesorthy, Joanna Entered By: Curtis Sitesorthy, Joanna on 09/20/2016 12:49:41 [name], [name] ([id number]) -------------------------------------------------------------------------------- Pain Assessment Details [name], [name] Date of Service: 09/20/2016 12:30 PM Medical Record Number: [id number] Patient Account Number: [id number] Date of Birth/Sex: 05/10/1946 (70  y.o. Female) Treating RN: Curtis Sites Primary Care Christyan Reger: Milon Score Other Clinician: Referring Wentworth Edelen: Treating Corlis Angelica/Extender: Rudene Re in Treatment: 1 Active Problems Location of Pain Severity and Description of Pain Patient Has Paino No Site Locations Pain Management and Medication Current Pain Management: Notes Topical or injectable lidocaine is offered to patient for acute pain when surgical debridement is performed. If needed, Patient is instructed to use over the counter pain medication for the following 24-48 hours after debridement. Wound care MDs do not prescribed pain medications. Patient has chronic pain or uncontrolled pain. Patient has been instructed to make an  appointment with their Primary Care Physician for pain management. Electronic Signature(s) Signed: 09/20/2016 5:14:48 PM By: Curtis Sites Entered By: Curtis Sites on 09/20/2016 12:37:46 [name] ([id number]) -------------------------------------------------------------------------------- Patient/Caregiver Education Details [name] Date of Service: 09/20/2016 12:30 PM Medical Record Number: [id number] Patient Account Number: [id number] Date of Birth/Gender: [birth year] (70 y.o. Female) Treating RN: Curtis Sites Primary Care Physician: Milon Score Other Clinician: Referring Physician: Treating Physician/Extender: Rudene Re in Treatment: 1 Education Assessment Education Provided To: Patient and Caregiver Education Topics Provided Wound/Skin Impairment: Handouts: Other: wound care as ordered Methods: Demonstration, Explain/Verbal Responses: State content correctly Electronic Signature(s) Signed: 09/20/2016 5:14:48 PM By: Curtis Sites Entered By: Curtis Sites on 09/20/2016 13:10:06 [name] ([id number]) -------------------------------------------------------------------------------- Wound Assessment Details [name] Date of Service: 09/20/2016 12:30 PM Medical Record Number: [id number] Patient Account Number: [id number] Date of Birth/Sex: October 13, 1945 (70 y.o. Female) Treating RN: Curtis Sites Primary Care Philopateer Strine: Milon Score Other Clinician: Referring Jerrold Haskell: Treating Basil Blakesley/Extender: Rudene Re in Treatment: 1 Wound Status Wound Number: 1 Primary Trauma, Other Etiology: Wound Location: Right Lower Leg - Anterior, Proximal Wound Open Status: Wounding Event: Trauma Comorbid Chronic Obstructive Pulmonary Date Acquired: 08/17/2016 History: Disease (COPD), Hypertension, Type Weeks Of Treatment: 1 II Diabetes Clustered Wound: No Photos Wound Measurements Length: (cm) 2.3 % Reduction  i Width: (cm) 1.7 % Reduction i Depth: (cm) 0.3 Epithelializa Area: (cm) 3.071 Tunneling: Volume: (cm) 0.921 Undermining: n Area: 15.9% n Volume: -152.3% tion: None No No Wound Description Full Thickness Without Foul Odor Aft Classification: Exposed Support Structures Slough/Fibrin Diabetic Severity Grade 1 (Wagner): Wound Margin: Flat and Intact Exudate Amount: Large Exudate Type: Serous Exudate Color: amber er Cleansing: No o No Wound Bed Granulation Amount: Small (1-33%) Exposed Structure Granulation Quality: Red Fascia Exposed: No Klostermann, Carmelina C. ([id number]) Necrotic Amount: Large (67-100%) Fat Layer (Subcutaneous Tissue) Exposed: No Necrotic Quality: Eschar Tendon Exposed: No Muscle Exposed: No Joint Exposed: No Bone Exposed: No Limited to Skin Breakdown Periwound Skin Texture Texture Color No Abnormalities Noted: No No Abnormalities Noted: No Callus: No Atrophie Blanche: No Crepitus: No Cyanosis: No Excoriation: No Ecchymosis: No Induration: No Erythema: Yes Rash: No Erythema Location: Circumferential Scarring: No Hemosiderin Staining: No Mottled: No Moisture Pallor: No No Abnormalities Noted: No Rubor: No Dry / Scaly: No Maceration: No Temperature / Pain Temperature: No Abnormality Tenderness on Palpation: Yes Wound Preparation Ulcer Cleansing: Rinsed/Irrigated with Saline Topical Anesthetic Applied: Other: lidocaine 4%, Treatment Notes Wound #1 (Right, Proximal, Anterior Lower Leg) 1. Cleansed with: Clean wound with Normal Saline 2. Anesthetic Topical Lidocaine 4% cream to wound bed prior to debridement 4. Dressing Applied: Aquacel Ag 5. Secondary Dressing Applied Bordered Foam Dressing Dry Gauze Electronic Signature(s) Signed: 09/20/2016 5:14:48 PM By: Curtis Sites Entered By: Curtis Sites on 09/20/2016 12:49:15 [name]  ([id number]) -------------------------------------------------------------------------------- Vitals Details [name].  Date of Service: 09/20/2016 12:30 PM Medical Record Number: [id number] Patient Account Number: [id number] Date of Birth/Sex: Dec 30, 1945 (71 y.o. Female) Treating RN: Curtis Sites Primary Care Clea Dubach: Milon Score Other Clinician: Referring Sally Menard: Treating Zilda No/Extender: Rudene Re in Treatment: 1 Vital Signs Time Taken: 12:37 Temperature (F): 97.9 Height (in): 67 Pulse (bpm): 75 Weight (lbs): 197 Respiratory Rate (breaths/min): 18 Body Mass Index (BMI): 30.9 Blood Pressure (mmHg): 154/64 Reference Range: 80 - 120 mg / dl Electronic Signature(s) Signed: 09/20/2016 5:14:48 PM By: Curtis Sites Entered By: Curtis Sites on 09/20/2016 12:38:09

## 2016-09-21 NOTE — Progress Notes (Signed)
[name] ([id number]) Visit Report for 09/20/2016 Chief Complaint Document Details [name], [name] Date of Service: 09/20/2016 12:30 PM Medical Record Number: [id number] Patient Account Number: [id number] Date of Birth/Sex: 08-14-1945 (70 y.o. Female) Treating RN: Curtis Sites Primary Care Provider: Milon Score Other Clinician: Referring Provider: Treating Provider/Extender: Rudene Re in Treatment: 1 Information Obtained from: Patient Chief Complaint Patients presents for treatment of an open diabetic ulcer due to a injury to her right lower extremity about 4 weeks Electronic Signature(s) Signed: 09/20/2016 1:20:12 PM By: Evlyn Kanner MD, FACS Entered By: Evlyn Kanner on 09/20/2016 13:20:11 [name] ([id number]) -------------------------------------------------------------------------------- HPI Details [name] Date of Service: 09/20/2016 12:30 PM Medical Record Number: [id number] Patient Account Number: [id number] Date of Birth/Sex: 03/26/1946 (70 y.o. Female) Treating RN: Curtis Sites Primary Care Provider: Milon Score Other Clinician: Referring Provider: Treating Provider/Extender: Rudene Re in Treatment: 1 History of Present Illness Location: right lower extremity in the area of the lateral calf below the knee Quality: Patient reports experiencing a sharp pain to affected area(s). Severity: Patient states wound are getting worse. Duration: Patient has had the wound for < 4 weeks prior to presenting for treatment Timing: Pain in wound is constant (hurts all the time) Context: The wound occurred when the patient a blunt injury and fall Modifying Factors: Other treatment(s) tried include:courses of doxycycline and local Bactroban ointment Associated Signs and Symptoms: Patient reports having increase discharge. HPI Description: 71 year old patient has been referred to as with right lower extremity wound  with edema and ecchymosis, after having a injury due to a fall on 08/20/2016. she has a history of diabetes mellitus type 2 which is uncomplicated, chronic back pain, COPD, hypertension, hypothyroidism, supraventricular tachycardia and tobacco abuse. He is status post neck surgery, appendectomy, cholecystectomy, hernia repair. He smokes about half a pack of cigarettes a day for the last 50 years. Recently on doxycycline for 10 days and was given symptomatic treatment for wound Electronic Signature(s) Signed: 09/20/2016 1:20:16 PM By: Evlyn Kanner MD, FACS Entered By: Evlyn Kanner on 09/20/2016 13:20:16 [name] ([id number]) -------------------------------------------------------------------------------- Physical Exam Details [name] Date of Service: 09/20/2016 12:30 PM Medical Record Number: [id number] Patient Account Number: [id number] Date of Birth/Sex: 10/11/1945 (70 y.o. Female) Treating RN: Curtis Sites Primary Care Provider: Milon Score Other Clinician: Referring Provider: Treating Provider/Extender: Rudene Re in Treatment: 1 Constitutional . Pulse regular. Respirations normal and unlabored. Afebrile. . Eyes Nonicteric. Reactive to light. Ears, Nose, Mouth, and Throat Lips, teeth, and gums WNL.Marland Kitchen Moist mucosa without lesions. Neck supple and nontender. No palpable supraclavicular or cervical adenopathy. Normal sized without goiter. Respiratory WNL. No retractions.. Breath sounds WNL, No rubs, rales, rhonchi, or wheeze.. Cardiovascular Heart rhythm and rate regular, no murmur or gallop.. Pedal Pulses WNL. No clubbing, cyanosis or edema. Lymphatic No adneopathy. No adenopathy. No adenopathy. Musculoskeletal Adexa without tenderness or enlargement.. Digits and nails w/o clubbing, cyanosis, infection, petechiae, ischemia, or inflammatory conditions.. Integumentary (Hair, Skin) No suspicious lesions. No crepitus or fluctuance. No  peri-wound warmth or erythema. No masses.Marland Kitchen Psychiatric Judgement and insight Intact.. No evidence of depression, anxiety, or agitation.. Notes the wound looks fairly clean and some of the superficial debris was washed out with moist saline gauze. No sharp dissection was required today. Electronic Signature(s) Signed: 09/20/2016 1:20:42 PM By: Evlyn Kanner MD, FACS Entered By: Evlyn Kanner on 09/20/2016 13:20:42 [name] ([id number]) -------------------------------------------------------------------------------- Physician Orders Details Patient Name: Janet Dredge  C. Date of Service: 09/20/2016 12:30 PM Medical Record Number: [id number] Patient Account Number: [id number] Date of Birth/Sex: [birth year](70 y.o. Female) Treating RN: Curtis Sitesorthy, Joanna Primary Care Provider: Milon ScoreJONES, CARON Other Clinician: Referring Provider: Treating Provider/Extender: Rudene ReBritto, Ellington Cornia Weeks in Treatment: 1 Verbal / Phone Orders: No Diagnosis Coding Wound Cleansing Wound #1 Right,Proximal,Anterior Lower Leg o Clean wound with Normal Saline. o May Shower, gently pat wound dry prior to applying new dressing. Anesthetic Wound #1 Right,Proximal,Anterior Lower Leg o Topical Lidocaine 4% cream applied to wound bed prior to debridement Primary Wound Dressing Wound #1 Right,Proximal,Anterior Lower Leg o Aquacel Ag Secondary Dressing Wound #1 Right,Proximal,Anterior Lower Leg o Boardered Foam Dressing - or telfa island dressing Dressing Change Frequency Wound #1 Right,Proximal,Anterior Lower Leg o Change dressing every day. Follow-up Appointments Wound #1 Right,Proximal,Anterior Lower Leg o Return Appointment in 1 week. Edema Control Wound #1 Right,Proximal,Anterior Lower Leg o Elevate legs to the level of the heart and pump ankles as often as possible Additional Orders / Instructions Wound #1 Right,Proximal,Anterior Lower Leg o Stop Smoking o Increase protein intake. o Other: -  Please try to keep blood sugars below 180 [name], [name] ([id number]) Electronic Signature(s) Signed: 09/20/2016 3:56:49 PM By: Evlyn KannerBritto, Kingsley Herandez MD, FACS Signed: 09/20/2016 5:14:48 PM By: Curtis Sitesorthy, Joanna Entered By: Curtis Sitesorthy, Joanna on 09/20/2016 13:06:29 [name], [name] ([id number]) -------------------------------------------------------------------------------- Problem List Details [name], [name] Date of Service: 09/20/2016 12:30 PM Medical Record Number: [id number] Patient Account Number: [id number] Date of Birth/Sex: [birth year] (70 y.o. 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 ([id number]) -------------------------------------------------------------------------------- Progress Note Details [name], [name] Date of Service: 09/20/2016 12:30 PM Medical Record Number: [id number] Patient Account Number: [id number] Date of Birth/Sex: [birth year](70 y.o. Female) Treating RN: Curtis Sitesorthy, Joanna Primary Care Provider: Milon ScoreJONES, CARON Other Clinician: Referring Provider: Treating Provider/Extender: Rudene ReBritto, Shacoria Latif Weeks in Treatment: 1 Subjective Chief Complaint Information obtained from Patient Patients presents for treatment of an open diabetic  ulcer due to a injury to her right lower extremity about 4 weeks History of Present Illness (HPI) The following HPI elements were documented for the patient's wound: Location: right lower extremity in the area of the lateral calf below the knee Quality: Patient reports experiencing a sharp pain to affected area(s). Severity: Patient states wound are getting worse. Duration: Patient has had the wound for < 4 weeks prior to presenting for treatment Timing: Pain in wound is constant (hurts all the time) Context: The wound occurred when the patient a blunt injury and fall Modifying Factors: Other treatment(s) tried include:courses of doxycycline and local Bactroban ointment Associated Signs and Symptoms: Patient reports having increase discharge. 71 year old patient has been referred to as with right lower extremity wound with edema and ecchymosis, after having a injury due to a fall on 08/20/2016. she has a history of diabetes mellitus type 2 which is uncomplicated, chronic back pain, COPD, hypertension, hypothyroidism, supraventricular tachycardia and  tobacco abuse. He is status post neck surgery, appendectomy, cholecystectomy, hernia repair. He smokes about half a pack of cigarettes a day for the last 50 years. Recently on doxycycline for 10 days and was given symptomatic treatment for wound Objective Constitutional Pulse regular. Respirations normal and unlabored. Afebrile. Vitals Time Taken: 12:37 PM, Height: 67 in, Weight: 197 lbs, BMI: 30.9, Temperature: 97.9 F, Pulse: 75 bpm, Respiratory Rate: 18 breaths/min, Blood Pressure: 154/64 mmHg. Eyes Petrey, Kendalynn C. ([id number]) Nonicteric. Reactive to light. Ears, Nose, Mouth, and Throat Lips, teeth, and gums WNL.Marland Kitchen Moist mucosa without lesions. Neck supple and nontender. No palpable supraclavicular or cervical adenopathy. Normal sized without goiter. Respiratory WNL. No retractions.. Breath sounds WNL, No rubs, rales, rhonchi, or  wheeze.. Cardiovascular Heart rhythm and rate regular, no murmur or gallop.. Pedal Pulses WNL. No clubbing, cyanosis or edema. Lymphatic No adneopathy. No adenopathy. No adenopathy. Musculoskeletal Adexa without tenderness or enlargement.. Digits and nails w/o clubbing, cyanosis, infection, petechiae, ischemia, or inflammatory conditions.Marland Kitchen Psychiatric Judgement and insight Intact.. No evidence of depression, anxiety, or agitation.. General Notes: the wound looks fairly clean and some of the superficial debris was washed out with moist saline gauze. No sharp dissection was required today. Integumentary (Hair, Skin) No suspicious lesions. No crepitus or fluctuance. No peri-wound warmth or erythema. No masses.. Wound #1 status is Open. Original cause of wound was Trauma. The wound is located on the Right,Proximal,Anterior Lower Leg. The wound measures 2.3cm length x 1.7cm width x 0.3cm depth; 3.071cm^2 area and 0.921cm^3 volume. The wound is limited to skin breakdown. There is no tunneling or undermining noted. There is a large amount of serous drainage noted. The wound margin is flat and intact. There is small (1-33%) red granulation within the wound bed. There is a large (67-100%) amount of necrotic tissue within the wound bed including Eschar. The periwound skin appearance exhibited: Erythema. The periwound skin appearance did not exhibit: Callus, Crepitus, Excoriation, Induration, Rash, Scarring, Dry/Scaly, Maceration, Atrophie Blanche, Cyanosis, Ecchymosis, Hemosiderin Staining, Mottled, Pallor, Rubor. The surrounding wound skin color is noted with erythema which is circumferential. Periwound temperature was noted as No Abnormality. The periwound has tenderness on palpation. Assessment Active Problems ICD-10 [name], BOLLER ([id number]) E11.622 - Type 2 diabetes mellitus with other skin ulcer M70.861 - Other soft tissue disorders related to use, overuse and pressure, right lower  leg L97.212 - Non-pressure chronic ulcer of right calf with fat layer exposed F17.218 - Nicotine dependence, cigarettes, with other nicotine-induced disorders Plan Wound Cleansing: Wound #1 Right,Proximal,Anterior Lower Leg: Clean wound with Normal Saline. May Shower, gently pat wound dry prior to applying new dressing. Anesthetic: Wound #1 Right,Proximal,Anterior Lower Leg: Topical Lidocaine 4% cream applied to wound bed prior to debridement Primary Wound Dressing: Wound #1 Right,Proximal,Anterior Lower Leg: Aquacel Ag Secondary Dressing: Wound #1 Right,Proximal,Anterior Lower Leg: Boardered Foam Dressing - or telfa island dressing Dressing Change Frequency: Wound #1 Right,Proximal,Anterior Lower Leg: Change dressing every day. Follow-up Appointments: Wound #1 Right,Proximal,Anterior Lower Leg: Return Appointment in 1 week. Edema Control: Wound #1 Right,Proximal,Anterior Lower Leg: Elevate legs to the level of the heart and pump ankles as often as possible Additional Orders / Instructions: Wound #1 Right,Proximal,Anterior Lower Leg: Stop Smoking Increase protein intake. Other: - Please try to keep blood sugars below 22 this 71 year old diabetic had a fairly large hematoma evacuated last week and after review I have recommended: 1. Daily packing with silver alginate and covered with a border foam. Yurkovich, Eriko C. ([id number]) 2. next week she may  be a good candidate for the snap vacuum-assisted wound care 3. good control of her diabetes mellitus 4. adequate protein, vitamin C, vitamin A and zinc 5. regular review at the wound center. Electronic Signature(s) Signed: 09/20/2016 1:21:51 PM By: Evlyn Kanner MD, FACS Entered By: Evlyn Kanner on 09/20/2016 13:21:51 MERELYN, KLUMP ([id number]) -------------------------------------------------------------------------------- SuperBill Details [name] Date of Service: 09/20/2016 Medical Record Number:  [id number] Patient Account Number: [id number] Date of Birth/Sex: July 27, 1945 (70 y.o. Female) Treating RN: Curtis Sites Primary Care Provider: Milon Score Other Clinician: Referring Provider: Treating Provider/Extender: Evlyn Kanner Service Line: Outpatient Weeks in Treatment: 1 Diagnosis Coding ICD-10 Codes Code Description E11.622 Type 2 diabetes mellitus with other skin ulcer M70.861 Other soft tissue disorders related to use, overuse and pressure, right lower leg L97.212 Non-pressure chronic ulcer of right calf with fat layer exposed F17.218 Nicotine dependence, cigarettes, with other nicotine-induced disorders Facility Procedures CPT4 Code: [id number] Description: 99213 - WOUND CARE VISIT-LEV 3 EST PT Modifier: Quantity: 1 Physician Procedures CPT4: Description Modifier Quantity Code [id number] 99213 - WC PHYS LEVEL 3 - EST PT 1 ICD-10 Description Diagnosis E11.622 Type 2 diabetes mellitus with other skin ulcer M70.861 Other soft tissue disorders related to use, overuse and pressure, right lower  leg L97.212 Non-pressure chronic ulcer of right calf with fat layer exposed F17.218 Nicotine dependence, cigarettes, with other nicotine-induced disorders Electronic Signature(s) Signed: 09/20/2016 1:22:05 PM By: Evlyn Kanner MD, FACS Entered By: Evlyn Kanner on 09/20/2016 13:22:05

## 2016-09-27 ENCOUNTER — Encounter: Payer: Medicare Other | Admitting: Surgery

## 2016-09-27 DIAGNOSIS — E11622 Type 2 diabetes mellitus with other skin ulcer: Secondary | ICD-10-CM | POA: Diagnosis not present

## 2016-09-28 NOTE — Progress Notes (Signed)
GLYNNA, FAILLA ([id number]) Visit Report for 09/27/2016 Arrival Information Details [name], [name] Date of Service: 09/27/2016 12:30 PM Medical Record Number: [id number] Patient Account Number: [id number] Date of Birth/Sex: 1946-03-05 (70 y.o. Female) Treating RN: Curtis Sites Primary Care Arilyn Brierley: Milon Score Other Clinician: Referring Mirai Greenwood: Treating Kolbe Delmonaco/Extender: Rudene Re in Treatment: 2 Visit Information History Since Last Visit Added or deleted any medications: No Patient Arrived: Ambulatory Any new allergies or adverse reactions: No Arrival Time: 12:53 Had a fall or experienced change in No Accompanied By: dtr in law activities of daily living that may affect Transfer Assistance: None risk of falls: Patient Identification Verified: Yes Signs or symptoms of abuse/neglect since last No Secondary Verification Process Yes visito Completed: Hospitalized since last visit: No Patient Has Alerts: Yes Has Dressing in Place as Prescribed: Yes Patient Alerts: DMII Pain Present Now: No Electronic Signature(s) Signed: 09/27/2016 4:31:35 PM By: Curtis Sites Entered By: Curtis Sites on 09/27/2016 12:53:32 [name] ([id number]) -------------------------------------------------------------------------------- Encounter Discharge Information Details [name] Date of Service: 09/27/2016 12:30 PM Medical Record Number: [id number] Patient Account Number: [id number] Date of Birth/Sex: 09-Dec-1945 (70 y.o. Female) Treating RN: Curtis Sites Primary Care Breckin Savannah: Milon Score Other Clinician: Referring Attallah Ontko: Treating Harrel Ferrone/Extender: Rudene Re in Treatment: 2 Encounter Discharge Information Items Discharge Pain Level: 0 Discharge Condition: Stable Ambulatory Status: Ambulatory Discharge Destination: Home Transportation: Private Auto Accompanied By: dtr in law Schedule Follow-up Appointment:  Yes Medication Reconciliation completed and provided to Patient/Care No Adahlia Stembridge: Provided on Clinical Summary of Care: 09/27/2016 Form Type Recipient Paper Patient ML Electronic Signature(s) Signed: 09/27/2016 4:01:18 PM By: Curtis Sites Previous Signature: 09/27/2016 1:31:25 PM Version By: Gwenlyn Perking Entered By: Curtis Sites on 09/27/2016 16:01:17 [name] ([id number]) -------------------------------------------------------------------------------- Lower Extremity Assessment Details [name] Date of Service: 09/27/2016 12:30 PM Medical Record Number: [id number] Patient Account Number: [id number] Date of Birth/Sex: 04-19-1946 (70 y.o. Female) Treating RN: Curtis Sites Primary Care Lonia Roane: Milon Score Other Clinician: Referring Mylen Mangan: Treating Arian Murley/Extender: Rudene Re in Treatment: 2 Vascular Assessment Pulses: Dorsalis Pedis Palpable: [Right:Yes] Posterior Tibial Extremity colors, hair growth, and conditions: Extremity Color: [Right:Normal] Hair Growth on Extremity: [Right:No] Temperature of Extremity: [Right:Warm] Capillary Refill: [Right:< 3 seconds] Electronic Signature(s) Signed: 09/27/2016 4:31:35 PM By: Curtis Sites Entered By: Curtis Sites on 09/27/2016 13:11:08 [name] ([id number]) -------------------------------------------------------------------------------- Multi Wound Chart Details [name] Date of Service: 09/27/2016 12:30 PM Medical Record Number: [id number] Patient Account Number: [id number] Date of Birth/Sex: [birth year] (70 y.o. Female) Treating RN: Curtis Sites Primary Care Tucker Steedley: Milon Score Other Clinician: Referring Stephaun Million: Treating Neal Oshea/Extender: Rudene Re in Treatment: 2 Vital Signs Height(in): 67 Pulse(bpm): 67 Weight(lbs): 197 Blood Pressure 151/62 (mmHg): Body Mass Index(BMI): 31 Temperature(F): 98.2 Respiratory  Rate 18 (breaths/min): Photos: [1:No Photos] [N/A:N/A] Wound Location: [1:Right Lower Leg - Anterior, Proximal] [N/A:N/A] Wounding Event: [1:Trauma] [N/A:N/A] Primary Etiology: [1:Trauma, Other] [N/A:N/A] Comorbid History: [1:Chronic Obstructive Pulmonary Disease (COPD), Hypertension, Type II Diabetes] [N/A:N/A] Date Acquired: [1:08/17/2016] [N/A:N/A] Weeks of Treatment: [1:2] [N/A:N/A] Wound Status: [1:Open] [N/A:N/A] Measurements L x W x D 2.3x1.7x0.5 [N/A:N/A] (cm) Area (cm) : [1:3.071] [N/A:N/A] Volume (cm) : [1:1.535] [N/A:N/A] % Reduction in Area: [1:15.90%] [N/A:N/A] % Reduction in Volume: -320.50% [N/A:N/A] Starting Position 1 1 (o'clock): Ending Position 1 [1:3] (o'clock): Maximum Distance 1 0.5 (cm): Undermining: [1:Yes] [N/A:N/A] Classification: [1:Full Thickness Without Exposed Support Structures] [N/A:N/A] HBO Classification: [1:Grade 1] [N/A:N/A] Exudate Amount: [1:Large] [N/A:N/A] Exudate Type: Serous N/A N/A  Exudate Color: amber N/A N/A Wound Margin: Flat and Intact N/A N/A Granulation Amount: Large (67-100%) N/A N/A Granulation Quality: Red N/A N/A Necrotic Amount: Small (1-33%) N/A N/A Necrotic Tissue: Eschar N/A N/A Exposed Structures: Fascia: No N/A N/A Fat Layer (Subcutaneous Tissue) Exposed: No Tendon: No Muscle: No Joint: No Bone: No Limited to Skin Breakdown Epithelialization: None N/A N/A Debridement: Debridement ([id number](11042- N/A N/A 11047) Pre-procedure 13:12 N/A N/A Verification/Time Out Taken: Pain Control: Lidocaine 4% Topical N/A N/A Solution Tissue Debrided: Fibrin/Slough, N/A N/A Subcutaneous Level: Skin/Subcutaneous N/A N/A Tissue Debridement Area (sq 3.91 N/A N/A cm): Instrument: Curette N/A N/A Bleeding: Minimum N/A N/A Hemostasis Achieved: Pressure N/A N/A Procedural Pain: 0 N/A N/A Post Procedural Pain: 0 N/A N/A Debridement Treatment Procedure was tolerated N/A N/A Response: well Post Debridement 2.3x1.7x0.5 N/A  N/A Measurements L x W x D (cm) Post Debridement 1.535 N/A N/A Volume: (cm) Periwound Skin Texture: Excoriation: No N/A N/A Induration: No Callus: No Crepitus: No Rash: No Scarring: No Periwound Skin Maceration: No N/A N/A Moisture: Dry/Scaly: No [name], [name] ([id number]) Periwound Skin Color: Erythema: Yes N/A N/A Atrophie Blanche: No Cyanosis: No Ecchymosis: No Hemosiderin Staining: No Mottled: No Pallor: No Rubor: No Erythema Location: Circumferential N/A N/A Temperature: No Abnormality N/A N/A Tenderness on Yes N/A N/A Palpation: Wound Preparation: Ulcer Cleansing: N/A N/A Rinsed/Irrigated with Saline Topical Anesthetic Applied: Other: lidocaine 4% Procedures Performed: Debridement N/A N/A Treatment Notes Electronic Signature(s) Signed: 09/27/2016 1:26:37 PM By: Evlyn KannerBritto, Errol MD, FACS Entered By: Evlyn KannerBritto, Errol on 09/27/2016 13:26:37 Janet SimpersLANGLEY, [name] ([id number]) -------------------------------------------------------------------------------- Multi-Disciplinary Care Plan Details Patient Name: Janet SimpersLANGLEY, Anjannette C. Date of Service: 09/27/2016 12:30 PM Medical Record Number: [id number] Patient Account Number: [id number] Date of Birth/Sex: 1945/09/27 (70 y.o. Female) Treating RN: Curtis Sitesorthy, Joanna Primary Care Vance Belcourt: Milon ScoreJONES, CARON Other Clinician: Referring Haidyn Kilburg: Treating Jamison Yuhasz/Extender: Rudene ReBritto, Errol Weeks in Treatment: 2 Active Inactive ` Necrotic Tissue Nursing Diagnoses: Impaired tissue integrity related to necrotic/devitalized tissue Goals: Necrotic/devitalized tissue will be minimized in the wound bed Date Initiated: 09/13/2016 Target Resolution Date: 10/18/2016 Goal Status: Active Interventions: Assess patient pain level pre-, during and post procedure and prior to discharge Notes: ` Orientation to the Wound Care Program Nursing Diagnoses: Knowledge deficit related to the wound healing center program Goals: Patient/caregiver will verbalize  understanding of the Wound Healing Center Program Date Initiated: 09/13/2016 Target Resolution Date: 12/20/2016 Goal Status: Active Interventions: Provide education on orientation to the wound center Notes: ` Wound/Skin Impairment Nursing Diagnoses: Impaired tissue integrity Janet SimpersLANGLEY, [name] ([id number]) Goals: Patient/caregiver will verbalize understanding of skin care regimen Date Initiated: 09/13/2016 Target Resolution Date: 12/20/2016 Goal Status: Active Ulcer/skin breakdown will have a volume reduction of 30% by week 4 Date Initiated: 09/13/2016 Target Resolution Date: 12/20/2016 Goal Status: Active Ulcer/skin breakdown will have a volume reduction of 50% by week 8 Date Initiated: 09/13/2016 Target Resolution Date: 12/20/2016 Goal Status: Active Ulcer/skin breakdown will have a volume reduction of 80% by week 12 Date Initiated: 09/13/2016 Target Resolution Date: 12/20/2016 Goal Status: Active Ulcer/skin breakdown will heal within 14 weeks Date Initiated: 09/13/2016 Target Resolution Date: 12/20/2016 Goal Status: Active Interventions: Assess patient/caregiver ability to obtain necessary supplies Assess patient/caregiver ability to perform ulcer/skin care regimen upon admission and as needed Assess ulceration(s) every visit Notes: Electronic Signature(s) Signed: 09/27/2016 4:31:35 PM By: Curtis Sitesorthy, Joanna Entered By: Curtis Sitesorthy, Joanna on 09/27/2016 13:11:12 Janet SimpersLANGLEY, [name] ([id number]) -------------------------------------------------------------------------------- Pain Assessment Details Patient Name: Janet SimpersLANGLEY, [name] Date of Service: 09/27/2016 12:30 PM Medical Record Number: [id number] Patient Account Number:  [id number] Date of Birth/Sex: 25-Mar-1946 (71 y.o. Female) Treating RN: Curtis Sites Primary Care Liliann File: Milon Score Other Clinician: Referring Carriann Hesse: Treating Gretna Bergin/Extender: Rudene Re in Treatment: 2 Active Problems Location of Pain Severity and Description of  Pain Patient Has Paino No Site Locations Pain Management and Medication Current Pain Management: Notes Topical or injectable lidocaine is offered to patient for acute pain when surgical debridement is performed. If needed, Patient is instructed to use over the counter pain medication for the following 24-48 hours after debridement. Wound care MDs do not prescribed pain medications. Patient has chronic pain or uncontrolled pain. Patient has been instructed to make an appointment with their Primary Care Physician for pain management. Electronic Signature(s) Signed: 09/27/2016 4:31:35 PM By: Curtis Sites Entered By: Curtis Sites on 09/27/2016 12:57:14 [name] ([id number]) -------------------------------------------------------------------------------- Patient/Caregiver Education Details [name] Date of Service: 09/27/2016 12:30 PM Medical Record Number: [id number] Patient Account Number: [id number] Date of Birth/Gender: [birth year] (70 y.o. Female) Treating RN: Curtis Sites Primary Care Physician: Milon Score Other Clinician: Referring Physician: Treating Physician/Extender: Rudene Re in Treatment: 2 Education Assessment Education Provided To: Patient and Caregiver Education Topics Provided Wound/Skin Impairment: Handouts: Other: snap vac usage Methods: Demonstration, Explain/Verbal Responses: State content correctly Electronic Signature(s) Signed: 09/27/2016 4:31:35 PM By: Curtis Sites Entered By: Curtis Sites on 09/27/2016 16:01:34 [name] ([id number]) -------------------------------------------------------------------------------- Wound Assessment Details [name] Date of Service: 09/27/2016 12:30 PM Medical Record Number: [id number] Patient Account Number: [id number] Date of Birth/Sex: 05-08-1946 (70 y.o. Female) Treating RN: Curtis Sites Primary Care Emmett Bracknell: Milon Score Other  Clinician: Referring Arlina Sabina: Treating Damere Brandenburg/Extender: Rudene Re in Treatment: 2 Wound Status Wound Number: 1 Primary Trauma, Other Etiology: Wound Location: Right Lower Leg - Anterior, Proximal Wound Open Status: Wounding Event: Trauma Comorbid Chronic Obstructive Pulmonary Date Acquired: 08/17/2016 History: Disease (COPD), Hypertension, Type Weeks Of Treatment: 2 II Diabetes Clustered Wound: No Photos Photo Uploaded By: Curtis Sites on 09/27/2016 14:56:33 Wound Measurements Length: (cm) 2.3 Width: (cm) 1.7 Depth: (cm) 0.5 Area: (cm) 3.071 Volume: (cm) 1.535 % Reduction in Area: 15.9% % Reduction in Volume: -320.5% Epithelialization: None Tunneling: No Undermining: Yes Starting Position (o'clock): 1 Ending Position (o'clock): 3 Maximum Distance: (cm) 0.5 Wound Description Full Thickness Without Classification: Exposed Support Structures Diabetic Severity Grade 1 (Wagner): Wound Margin: Flat and Intact Exudate Amount: Large Gutter, [name] ([id number]) Foul Odor After Cleansing: No Slough/Fibrino No Exudate Type: Serous Exudate Color: amber Wound Bed Granulation Amount: Large (67-100%) Exposed Structure Granulation Quality: Red Fascia Exposed: No Necrotic Amount: Small (1-33%) Fat Layer (Subcutaneous Tissue) Exposed: No Necrotic Quality: Eschar Tendon Exposed: No Muscle Exposed: No Joint Exposed: No Bone Exposed: No Limited to Skin Breakdown Periwound Skin Texture Texture Color No Abnormalities Noted: No No Abnormalities Noted: No Callus: No Atrophie Blanche: No Crepitus: No Cyanosis: No Excoriation: No Ecchymosis: No Induration: No Erythema: Yes Rash: No Erythema Location: Circumferential Scarring: No Hemosiderin Staining: No Mottled: No Moisture Pallor: No No Abnormalities Noted: No Rubor: No Dry / Scaly: No Maceration: No Temperature / Pain Temperature: No Abnormality Tenderness on Palpation: Yes Wound  Preparation Ulcer Cleansing: Rinsed/Irrigated with Saline Topical Anesthetic Applied: Other: lidocaine 4%, Treatment Notes Wound #1 (Right, Proximal, Anterior Lower Leg) 1. Cleansed with: Clean wound with Normal Saline 2. Anesthetic Topical Lidocaine 4% cream to wound bed prior to debridement 3. Peri-wound Care: Skin Prep 8. Negative Pressure Wound Therapy Wound Vac to wound continuously at 165mm/hg pressure Other (specify in notes)  Notes snap vac applied today EMMETT, ARNTZ ([id number]) Electronic Signature(s) Signed: 09/27/2016 4:31:35 PM By: Curtis Sites Entered By: Curtis Sites on 09/27/2016 12:59:23 ORLI, DEGRAVE ([id number]) -------------------------------------------------------------------------------- Vitals Details [name] Date of Service: 09/27/2016 12:30 PM Medical Record Number: [id number] Patient Account Number: [id number] Date of Birth/Sex: 10-Nov-1945 (71 y.o. Female) Treating RN: Curtis Sites Primary Care Kyna Blahnik: Milon Score Other Clinician: Referring Kannen Moxey: Treating Aldair Rickel/Extender: Rudene Re in Treatment: 2 Vital Signs Time Taken: 13:02 Temperature (F): 98.2 Height (in): 67 Pulse (bpm): 67 Weight (lbs): 197 Respiratory Rate (breaths/min): 18 Body Mass Index (BMI): 30.9 Blood Pressure (mmHg): 151/62 Reference Range: 80 - 120 mg / dl Electronic Signature(s) Signed: 09/27/2016 4:31:35 PM By: Curtis Sites Entered By: Curtis Sites on 09/27/2016 13:02:07

## 2016-09-28 NOTE — Progress Notes (Signed)
[name], [name] ([id number]) Visit Report for 09/27/2016 Chief Complaint Document Details [name], [name] Date of Service: 09/27/2016 12:30 PM Medical Record Number: [id number] Patient Account Number: [id number] Date of Birth/Sex: Mar 14, 1946 (70 y.o. Female) Treating RN: Curtis Sites Primary Care Provider: Milon Score Other Clinician: Referring Provider: Treating Provider/Extender: Rudene Re in Treatment: 2 Information Obtained from: Patient Chief Complaint Patients presents for treatment of an open diabetic ulcer due to a injury to her right lower extremity about 4 weeks Electronic Signature(s) Signed: 09/27/2016 1:27:25 PM By: Evlyn Kanner MD, FACS Entered By: Evlyn Kanner on 09/27/2016 13:27:25 [name] ([id number]) -------------------------------------------------------------------------------- Debridement Details [name] Date of Service: 09/27/2016 12:30 PM Medical Record Number: [id number] Patient Account Number: [id number] Date of Birth/Sex: 11/02/1945 (70 y.o. Female) Treating RN: Curtis Sites Primary Care Provider: Milon Score Other Clinician: Referring Provider: Treating Provider/Extender: Rudene Re in Treatment: 2 Debridement Performed for Wound #1 Right,Proximal,Anterior Lower Leg Assessment: Performed By: Physician Evlyn Kanner, MD Debridement: Debridement Pre-procedure Yes - 13:12 Verification/Time Out Taken: Start Time: 13:12 Pain Control: Lidocaine 4% Topical Solution Level: Skin/Subcutaneous Tissue Total Area Debrided (L x 2.3 (cm) x 1.7 (cm) = 3.91 (cm) W): Tissue and other Viable, Non-Viable, Fibrin/Slough, Subcutaneous material debrided: Instrument: Curette Bleeding: Minimum Hemostasis Achieved: Pressure End Time: 13:14 Procedural Pain: 0 Post Procedural Pain: 0 Response to Treatment: Procedure was tolerated well Post Debridement Measurements of Total Wound Length: (cm)  2.3 Width: (cm) 1.7 Depth: (cm) 0.5 Volume: (cm) 1.535 Character of Wound/Ulcer Post Improved Debridement: Severity of Tissue Post Debridement: Fat layer exposed Post Procedure Diagnosis Same as Pre-procedure Notes after the debridement was done a snap vacuum device was placed over the wound. Electronic Signature(s) Signed: 09/27/2016 1:27:19 PM By: Evlyn Kanner MD, FACS Signed: 09/27/2016 4:31:35 PM By: Odie Sera ([id number]) Previous Signature: 09/27/2016 1:26:46 PM Version By: Evlyn Kanner MD, FACS Entered By: Evlyn Kanner on 09/27/2016 13:27:18 [name], [name] ([id number]) -------------------------------------------------------------------------------- HPI Details [name] Date of Service: 09/27/2016 12:30 PM Medical Record Number: [id number] Patient Account Number: [id number] Date of Birth/Sex: Mar 03, 1946 (70 y.o. Female) Treating RN: Curtis Sites Primary Care Provider: Milon Score Other Clinician: Referring Provider: Treating Provider/Extender: Rudene Re in Treatment: 2 History of Present Illness Location: right lower extremity in the area of the lateral calf below the knee Quality: Patient reports experiencing a sharp pain to affected area(s). Severity: Patient states wound are getting worse. Duration: Patient has had the wound for < 4 weeks prior to presenting for treatment Timing: Pain in wound is constant (hurts all the time) Context: The wound occurred when the patient a blunt injury and fall Modifying Factors: Other treatment(s) tried include:courses of doxycycline and local Bactroban ointment Associated Signs and Symptoms: Patient reports having increase discharge. HPI Description: 71 year old patient has been referred to as with right lower extremity wound with edema and ecchymosis, after having a injury due to a fall on 08/20/2016. she has a history of diabetes mellitus type 2 which is uncomplicated,  chronic back pain, COPD, hypertension, hypothyroidism, supraventricular tachycardia and tobacco abuse. He is status post neck surgery, appendectomy, cholecystectomy, hernia repair. He smokes about half a pack of cigarettes a day for the last 50 years. Recently on doxycycline for 10 days and was given symptomatic treatment for wound Electronic Signature(s) Signed: 09/27/2016 1:27:32 PM By: Evlyn Kanner MD, FACS Entered By: Evlyn Kanner on 09/27/2016 13:27:32 [name] ([id number]) -------------------------------------------------------------------------------- Physical Exam Details  [name], [name] Date of Service: 09/27/2016 12:30 PM Medical Record Number: [id number] Patient Account Number: [id number] Date of Birth/Sex: [birth year] (71 y.o. Female) Treating RN: Curtis Sites Primary Care Provider: Milon Score Other Clinician: Referring Provider: Treating Provider/Extender: Rudene Re in Treatment: 2 Constitutional . Pulse regular. Respirations normal and unlabored. Afebrile. . Eyes Nonicteric. Reactive to light. Ears, Nose, Mouth, and Throat Lips, teeth, and gums WNL.Marland Kitchen Moist mucosa without lesions. Neck supple and nontender. No palpable supraclavicular or cervical adenopathy. Normal sized without goiter. Respiratory WNL. No retractions.. Cardiovascular Pedal Pulses WNL. No clubbing, cyanosis or edema. Chest Breasts symmetical and no nipple discharge.. Breast tissue WNL, no masses, lumps, or tenderness.. Lymphatic No adneopathy. No adenopathy. No adenopathy. Musculoskeletal Adexa without tenderness or enlargement.. Digits and nails w/o clubbing, cyanosis, infection, petechiae, ischemia, or inflammatory conditions.. Integumentary (Hair, Skin) No suspicious lesions. No crepitus or fluctuance. No peri-wound warmth or erythema. No masses.Marland Kitchen Psychiatric Judgement and insight Intact.. No evidence of depression, anxiety, or agitation.. Notes the wound  has some subcutaneous debris which I sharply removed with a #3 curet and minimal bleeding controlled with pressure. A snap vacuum device was then applied over the wound. Electronic Signature(s) Signed: 09/27/2016 1:28:03 PM By: Evlyn Kanner MD, FACS Entered By: Evlyn Kanner on 09/27/2016 13:28:03 [name], [name] ([id number]) -------------------------------------------------------------------------------- Physician Orders Details [name] Date of Service: 09/27/2016 12:30 PM Medical Record Number: [id number] Patient Account Number: [id number] Date of Birth/Sex: 29-Aug-1945 (70 y.o. Female) Treating RN: Curtis Sites Primary Care Provider: Milon Score Other Clinician: Referring Provider: Treating Provider/Extender: Rudene Re in Treatment: 2 Verbal / Phone Orders: No Diagnosis Coding Wound Cleansing Wound #1 Right,Proximal,Anterior Lower Leg o Clean wound with Normal Saline. o May Shower, gently pat wound dry prior to applying new dressing. Anesthetic Wound #1 Right,Proximal,Anterior Lower Leg o Topical Lidocaine 4% cream applied to wound bed prior to debridement Skin Barriers/Peri-Wound Care Wound #1 Right,Proximal,Anterior Lower Leg o Skin Prep Dressing Change Frequency Wound #1 Right,Proximal,Anterior Lower Leg o Change dressing every week Follow-up Appointments Wound #1 Right,Proximal,Anterior Lower Leg o Return Appointment in 1 week. o Nurse Visit as needed Edema Control Wound #1 Right,Proximal,Anterior Lower Leg o Elevate legs to the level of the heart and pump ankles as often as possible Additional Orders / Instructions Wound #1 Right,Proximal,Anterior Lower Leg o Stop Smoking o Increase protein intake. o Other: - Please try to keep blood sugars below 180 Negative Pressure Wound Therapy Wound #1 Right,Proximal,Anterior Lower Leg o Other: - SNAP Vac continuous at 125 mmHg [name], [name] ([id number]) Electronic  Signature(s) Signed: 09/27/2016 1:47:27 PM By: Evlyn Kanner MD, FACS Signed: 09/27/2016 4:31:35 PM By: Curtis Sites Entered By: Curtis Sites on 09/27/2016 13:24:24 [name] ([id number]) -------------------------------------------------------------------------------- Problem List Details [name] Date of Service: 09/27/2016 12:30 PM Medical Record Number: [id number] Patient Account Number: [id number] Date of Birth/Sex: 08/29/1945 (70 y.o. 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 Female) Treating RN: Curtis Sites Primary Care Provider: Milon Score Other Clinician: Referring Provider: Treating Provider/Extender: Rudene Re in Treatment: 2 Subjective Chief Complaint Information obtained from Patient Patients presents for treatment of an open diabetic ulcer due to a injury to her right lower extremity about 4 weeks History of  Present Illness (HPI) The following HPI elements were documented for the patient's wound: Location: right lower extremity in the area of the lateral calf below the knee Quality: Patient reports experiencing a sharp pain to affected area(s). Severity: Patient states wound are getting worse. Duration: Patient has had the wound for < 4 weeks prior to presenting for treatment Timing: Pain in wound is constant (hurts all the time) Context: The wound occurred when the patient a blunt injury and fall Modifying Factors: Other treatment(s) tried include:courses of doxycycline and local Bactroban ointment Associated Signs and Symptoms: Patient reports having increase discharge. 71 year old patient has been referred to as with right lower extremity wound with edema and ecchymosis, after having a injury due to a fall on 08/20/2016. she has a history of diabetes mellitus type 2 which is uncomplicated, chronic back pain, COPD, hypertension, hypothyroidism, supraventricular tachycardia and tobacco abuse. He is status post neck surgery, appendectomy, cholecystectomy, hernia repair. He smokes about half a pack of cigarettes a day for the last 50 years. Recently on doxycycline for 10 days and was given symptomatic treatment for wound Objective Constitutional Pulse regular. Respirations normal and unlabored. Afebrile. Vitals Time Taken: 1:02 PM, Height: 67 in, Weight: 197 lbs, BMI: 30.9, Temperature: 98.2 F, Pulse: 67 bpm, Respiratory Rate: 18 breaths/min, Blood Pressure: 151/62 mmHg. Eyes [place], Ravan [name]. ([id number]) Nonicteric. Reactive to light. Ears, Nose, Mouth, and Throat Lips, teeth, and gums WNL.Marland Kitchen Moist mucosa without lesions. Neck supple and nontender. No palpable supraclavicular or cervical adenopathy. Normal sized without goiter. Respiratory WNL. No retractions.. Cardiovascular Pedal Pulses WNL. No clubbing, cyanosis or edema. Chest Breasts symmetical and no nipple discharge.. Breast  tissue WNL, no masses, lumps, or tenderness.. Lymphatic No adneopathy. No adenopathy. No adenopathy. Musculoskeletal Adexa without tenderness or enlargement.. Digits and nails w/o clubbing, cyanosis, infection, petechiae, ischemia, or inflammatory conditions.Marland Kitchen Psychiatric Judgement and insight Intact.. No evidence of depression, anxiety, or agitation.. General Notes: the wound has some subcutaneous debris which I sharply removed with a #3 curet and minimal bleeding controlled with pressure. A snap vacuum device was then applied over the wound. Integumentary (Hair, Skin) No suspicious lesions. No crepitus or fluctuance. No peri-wound warmth or erythema. No masses.. Wound #1 status is Open. Original cause of wound was Trauma. The wound is located on the Right,Proximal,Anterior Lower Leg. The wound measures 2.3cm length x 1.7cm width x 0.5cm depth; 3.071cm^2 area and 1.535cm^3 volume. The wound is limited to skin breakdown. There is no tunneling noted, however, there is undermining starting at 1:00 and ending at 3:00 with a maximum distance of 0.5cm. There is a large amount of serous drainage noted. The wound margin is flat and intact. There is large (67-100%) red granulation within the wound bed. There is a small (1-33%) amount of necrotic tissue within the wound bed including Eschar. The periwound skin appearance exhibited: Erythema. The periwound skin appearance did not exhibit: Callus, Crepitus, Excoriation, Induration, Rash, Scarring, Dry/Scaly, Maceration, Atrophie Blanche, Cyanosis, Ecchymosis,  Hemosiderin Staining, Mottled, Pallor, Rubor. The surrounding wound skin color is noted with erythema which is circumferential. Periwound temperature was noted as No Abnormality. The periwound has tenderness on palpation. [name], [name]. ([id number]) Assessment Active Problems ICD-10 E11.622 - Type 2 diabetes mellitus with other skin ulcer M70.861 - Other soft tissue disorders related to use,  overuse and pressure, right lower leg L97.212 - Non-pressure chronic ulcer of right calf with fat layer exposed F17.218 - Nicotine dependence, cigarettes, with other nicotine-induced disorders Procedures Wound #1 Wound #1 is a Trauma, Other located on the Right,Proximal,Anterior Lower Leg . There was a Skin/Subcutaneous Tissue Debridement ([id number]) debridement with total area of 3.91 sq cm performed by Evlyn KannerBritto, Elwood Bazinet, MD. with the following instrument(s): Curette to remove Viable and Non-Viable tissue/material including Fibrin/Slough and Subcutaneous after achieving pain control using Lidocaine 4% Topical Solution. A time out was conducted at 13:12, prior to the start of the procedure. A Minimum amount of bleeding was controlled with Pressure. The procedure was tolerated well with a pain level of 0 throughout and a pain level of 0 following the procedure. Post Debridement Measurements: 2.3cm length x 1.7cm width x 0.5cm depth; 1.535cm^3 volume. Character of Wound/Ulcer Post Debridement is improved. Severity of Tissue Post Debridement is: Fat layer exposed. Post procedure Diagnosis Wound #1: Same as Pre-Procedure General Notes: after the debridement was done a snap vacuum device was placed over the wound.. Plan Wound Cleansing: Wound #1 Right,Proximal,Anterior Lower Leg: Clean wound with Normal Saline. May Shower, gently pat wound dry prior to applying new dressing. Anesthetic: Wound #1 Right,Proximal,Anterior Lower Leg: Topical Lidocaine 4% cream applied to wound bed prior to debridement Skin Barriers/Peri-Wound Care: Wound #1 Right,Proximal,Anterior Lower Leg: Skin Prep Dressing Change Frequency: Wound #1 Right,Proximal,Anterior Lower Leg: Wrightsman, Vessie [name]. ([id number]) Change dressing every week Follow-up Appointments: Wound #1 Right,Proximal,Anterior Lower Leg: Return Appointment in 1 week. Nurse Visit as needed Edema Control: Wound #1 Right,Proximal,Anterior Lower  Leg: Elevate legs to the level of the heart and pump ankles as often as possible Additional Orders / Instructions: Wound #1 Right,Proximal,Anterior Lower Leg: Stop Smoking Increase protein intake. Other: - Please try to keep blood sugars below 180 Negative Pressure Wound Therapy: Wound #1 Right,Proximal,Anterior Lower Leg: Other: - SNAP Vac continuous at 125 mmHg I have recommended: 1. care of the snap vacuum device has been discussed with them and we will see her back next week for a change 2. good control of her diabetes mellitus 3. adequate protein, vitamin [name], vitamin A and zinc 4. regular review at the wound center. Electronic Signature(s) Signed: 09/27/2016 1:29:17 PM By: Evlyn KannerBritto, Floyed Masoud MD, FACS Entered By: Evlyn KannerBritto, Javia Dillow on 09/27/2016 13:29:16 [name], [name]. ([id number]) -------------------------------------------------------------------------------- SuperBill Details [name], [name]. Date of Service: 09/27/2016 Medical Record Number: [id number] Patient Account Number: [id number] Date of Birth/Sex: 11/14/1945 (70 y.o. Female) Treating RN: Curtis Sitesorthy, Joanna Primary Care Provider: Milon ScoreJONES, CARON Other Clinician: Referring Provider: Treating Provider/Extender: Evlyn KannerBritto, Adja Ruff Service Line: Outpatient Weeks in Treatment: 2 Diagnosis Coding ICD-10 Codes Code Description E11.622 Type 2 diabetes mellitus with other skin ulcer M70.861 Other soft tissue disorders related to use, overuse and pressure, right lower leg L97.212 Non-pressure chronic ulcer of right calf with fat layer exposed F17.218 Nicotine dependence, cigarettes, with other nicotine-induced disorders Facility Procedures CPT4: Description Modifier Quantity Code [id number] 11042 - DEB SUBQ TISSUE 20 SQ CM/< 1 ICD-10 Description Diagnosis E11.622 Type 2 diabetes mellitus with other skin ulcer M70.861 Other soft tissue disorders related to use, overuse and pressure, right  lower leg L97.212 Non-pressure chronic ulcer  of right calf with fat layer exposed Physician Procedures CPT4: Description Modifier Quantity Code [id number] 11042 - WC PHYS SUBQ TISS 20 SQ CM 1 ICD-10 Description Diagnosis E11.622 Type 2 diabetes mellitus with other skin ulcer M70.861 Other soft tissue disorders related to use, overuse and pressure, right  lower leg L97.212 Non-pressure chronic ulcer of right calf with fat layer exposed Electronic Signature(s) Signed: 09/27/2016 1:29:32 PM By: Evlyn Kanner MD, FACS Entered By: Evlyn Kanner on 09/27/2016 13:29:31

## 2016-10-04 ENCOUNTER — Encounter: Payer: Medicare Other | Admitting: Surgery

## 2016-10-04 DIAGNOSIS — E11622 Type 2 diabetes mellitus with other skin ulcer: Secondary | ICD-10-CM | POA: Diagnosis not present

## 2016-10-05 NOTE — Progress Notes (Signed)
AVERILL, WINTERS ([id number]) Visit Report for 10/04/2016 Arrival Information Details [name], [name] Date of Service: 10/04/2016 1:30 PM Medical Record Number: [id number] Patient Account Number: [id number] Date of Birth/Sex: February 23, 1946 (70 y.o. Female) Treating RN: [name] Primary Care Janet Alles: Milon Score Other Clinician: Referring Tenna Lacko: Treating [name]/Extender: [name] in Treatment: 3 Visit Information History Since Last Visit Added or deleted any medications: No Patient Arrived: Ambulatory Any new allergies or adverse reactions: No Arrival Time: 13:27 Had a fall or experienced change in No Accompanied By: self activities of daily living that may affect Transfer Assistance: None risk of falls: Patient Identification Verified: Yes Signs or symptoms of abuse/neglect since last No Secondary Verification Process Yes visito Completed: Hospitalized since last visit: No Patient Has Alerts: Yes Has Dressing in Place as Prescribed: Yes Patient Alerts: DMII Pain Present Now: No Electronic Signature(s) Signed: 10/04/2016 5:06:34 PM By: Janet Gurney, RN, BSN, Kim RN, BSN Entered By: Janet Gurney, RN, BSN, [name] on 10/04/2016 13:27:36 [name] ([id number]) -------------------------------------------------------------------------------- Encounter Discharge Information Details [name] Date of Service: 10/04/2016 1:30 PM Medical Record Number: [id number] Patient Account Number: [id number] Date of Birth/Sex: 24-Nov-1945 (70 y.o. Female) Treating RN: [name] Primary Care [name]: Milon Score Other Clinician: Referring Kinzleigh Kandler: Treating Hadeel Hillebrand/Extender: [name] in Treatment: 3 Encounter Discharge Information Items Discharge Pain Level: 0 Discharge Condition: Stable Ambulatory Status: Ambulatory Discharge Destination: Home Transportation: Private Auto Accompanied By: self Schedule Follow-up Appointment:  Yes Medication Reconciliation completed and provided to Patient/Care Yes [name]: Provided on Clinical Summary of Care: 10/04/2016 Form Type Recipient Paper Patient ML Electronic Signature(s) Signed: 10/04/2016 5:06:34 PM By: Janet Gurney RN, BSN, Kim RN, BSN Previous Signature: 10/04/2016 1:57:58 PM Version By: Gwenlyn Perking Entered By: Janet Gurney RN, BSN, [name] on 10/04/2016 14:03:36 [name] ([id number]) -------------------------------------------------------------------------------- Lower Extremity Assessment Details [name] Date of Service: 10/04/2016 1:30 PM Medical Record Number: [id number] Patient Account Number: [id number] Date of Birth/Sex: Aug 04, 1945 (70 y.o. Female) Treating RN: [name] Primary Care [name]: Milon Score Other Clinician: Referring Beata Beason: Treating Zaniah Titterington/Extender: [name] in Treatment: 3 Vascular Assessment Pulses: Dorsalis Pedis Palpable: [Right:Yes] Posterior Tibial Palpable: [Right:Yes] Extremity colors, hair growth, and conditions: Extremity Color: [Right:Normal] Hair Growth on Extremity: [Right:Yes] Temperature of Extremity: [Right:Warm] Capillary Refill: [Right:< 3 seconds] Dependent Rubor: [Right:No] Blanched when Elevated: [Right:No] Lipodermatosclerosis: [Right:No] Toe Nail Assessment Left: Right: Thick: No Discolored: No Deformed: No Improper Length and Hygiene: No Electronic Signature(s) Signed: 10/04/2016 5:06:34 PM By: Janet Gurney, RN, BSN, Kim RN, BSN Entered By: Janet Gurney, RN, BSN, [name] on 10/04/2016 13:37:13 [name] ([id number]) -------------------------------------------------------------------------------- Multi Wound Chart Details [name] Date of Service: 10/04/2016 1:30 PM Medical Record Number: [id number] Patient Account Number: [id number] Date of Birth/Sex: January 01, 1946 (70 y.o. Female) Treating RN: [name] Primary Care [name]: Milon Score Other  Clinician: Referring Kilo Eshelman: Treating Nafeesah Lapaglia/Extender: [name] in Treatment: 3 Vital Signs Height(in): 67 Pulse(bpm): 83 Weight(lbs): 197 Blood Pressure 154/64 (mmHg): Body Mass Index(BMI): 31 Temperature(F): 97.2 Respiratory Rate 16 (breaths/min): Photos: [N/A:N/A] Wound Location: Right Lower Leg - N/A N/A Anterior, Proximal Wounding Event: Trauma N/A N/A Primary Etiology: Trauma, Other N/A N/A Comorbid History: Chronic Obstructive N/A N/A Pulmonary Disease (COPD), Hypertension, Type II Diabetes Date Acquired: 08/17/2016 N/A N/A Weeks of Treatment: 3 N/A N/A Wound Status: Open N/A N/A Measurements L x W x D 2.1x1.5x0.4 N/A N/A (cm) Area (cm) : 2.474 N/A N/A Volume (cm) : 0.99 N/A N/A % Reduction in  Area: 32.30% N/A N/A % Reduction in Volume: -171.20% N/A N/A Starting Position 1 9 (o'clock): Ending Position 1 3 (o'clock): Maximum Distance 1 0.5 (cm): Undermining: Yes N/A N/A Classification: N/A N/A [name], [name] ([id number]) Full Thickness Without Exposed Support Structures HBO Classification: Grade 1 N/A N/A Exudate Amount: Large N/A N/A Exudate Type: Serous N/A N/A Exudate Color: amber N/A N/A Wound Margin: Flat and Intact N/A N/A Granulation Amount: Large (67-100%) N/A N/A Granulation Quality: Red N/A N/A Necrotic Amount: Small (1-33%) N/A N/A Exposed Structures: Fat Layer (Subcutaneous N/A N/A Tissue) Exposed: Yes Fascia: No Tendon: No Muscle: No Joint: No Bone: No Epithelialization: None N/A N/A Debridement: Debridement ([id number](11042- N/A N/A 11047) Pre-procedure 13:43 N/A N/A Verification/Time Out Taken: Pain Control: Other N/A N/A Tissue Debrided: Fibrin/Slough, N/A N/A Subcutaneous Level: Skin/Subcutaneous N/A N/A Tissue Debridement Area (sq 3.15 N/A N/A cm): Instrument: Curette N/A N/A Bleeding: Minimum N/A N/A Hemostasis Achieved: Pressure N/A N/A Procedural Pain: 0 N/A N/A Post Procedural Pain: 0 N/A  N/A Debridement Treatment Procedure was tolerated N/A N/A Response: well Post Debridement 2.1x1.5x0.4 N/A N/A Measurements L x W x D (cm) Post Debridement 0.99 N/A N/A Volume: (cm) Periwound Skin Texture: Induration: Yes N/A N/A Excoriation: No Callus: No Crepitus: No Rash: No Scarring: No Margraf, Sharri C. ([id number]) Periwound Skin Maceration: Yes N/A N/A Moisture: Dry/Scaly: No Periwound Skin Color: Ecchymosis: Yes N/A N/A Atrophie Blanche: No Cyanosis: No Erythema: No Hemosiderin Staining: No Mottled: No Pallor: No Rubor: No Temperature: No Abnormality N/A N/A Tenderness on Yes N/A N/A Palpation: Wound Preparation: Ulcer Cleansing: N/A N/A Rinsed/Irrigated with Saline Topical Anesthetic Applied: Other: lidocaine 4% Procedures Performed: Debridement N/A N/A Treatment Notes Electronic Signature(s) Signed: 10/04/2016 2:00:30 PM By: Evlyn KannerBritto, Errol MD, FACS Entered By: Evlyn KannerBritto, Errol on 10/04/2016 14:00:30 [name], [name] ([id number]) -------------------------------------------------------------------------------- Multi-Disciplinary Care Plan Details [name], [name] Date of Service: 10/04/2016 1:30 PM Medical Record Number: [id number] Patient Account Number: [id number] Date of Birth/Sex: [birth year] (70 y.o. Female) Treating RN: Janet CoventryWoody, [name] Primary Care Shanaye Rief: Milon ScoreJONES, CARON Other Clinician: Referring Hajar Penninger: Treating Marely Apgar/Extender: Janet ReBritto, Errol Weeks in Treatment: 3 Active Inactive ` Necrotic Tissue Nursing Diagnoses: Impaired tissue integrity related to necrotic/devitalized tissue Goals: Necrotic/devitalized tissue will be minimized in the wound bed Date Initiated: 09/13/2016 Target Resolution Date: 10/18/2016 Goal Status: Active Interventions: Assess patient pain level pre-, during and post procedure and prior to discharge Notes: ` Orientation to the Wound Care Program Nursing Diagnoses: Knowledge deficit related to the wound  healing center program Goals: Patient/caregiver will verbalize understanding of the Wound Healing Center Program Date Initiated: 09/13/2016 Target Resolution Date: 12/20/2016 Goal Status: Active Interventions: Provide education on orientation to the wound center Notes: ` Wound/Skin Impairment Nursing Diagnoses: Impaired tissue integrity [name], [name] ([id number]) Goals: Patient/caregiver will verbalize understanding of skin care regimen Date Initiated: 09/13/2016 Target Resolution Date: 12/20/2016 Goal Status: Active Ulcer/skin breakdown will have a volume reduction of 30% by week 4 Date Initiated: 09/13/2016 Target Resolution Date: 12/20/2016 Goal Status: Active Ulcer/skin breakdown will have a volume reduction of 50% by week 8 Date Initiated: 09/13/2016 Target Resolution Date: 12/20/2016 Goal Status: Active Ulcer/skin breakdown will have a volume reduction of 80% by week 12 Date Initiated: 09/13/2016 Target Resolution Date: 12/20/2016 Goal Status: Active Ulcer/skin breakdown will heal within 14 weeks Date Initiated: 09/13/2016 Target Resolution Date: 12/20/2016 Goal Status: Active Interventions: Assess patient/caregiver ability to obtain necessary supplies Assess patient/caregiver ability to perform ulcer/skin care regimen upon admission and as needed Assess ulceration(s) every visit Notes:  Electronic Signature(s) Signed: 10/04/2016 5:06:34 PM By: Janet Gurney RN, BSN, Kim RN, BSN Entered By: Janet Gurney, RN, BSN, [name] on 10/04/2016 13:37:18 [name], [name] ([id number]) -------------------------------------------------------------------------------- Pain Assessment Details [name] Date of Service: 10/04/2016 1:30 PM Medical Record Number: [id number] Patient Account Number: [id number] Date of Birth/Sex: [birth year] (70 y.o. Female) Treating RN: [name] Primary Care Latwan Luchsinger: Milon Score Other Clinician: Referring Kalon Erhardt: Treating Kathey Simer/Extender: [name] in  Treatment: 3 Active Problems Location of Pain Severity and Description of Pain Patient Has Paino No Site Locations With Dressing Change: No Pain Management and Medication Current Pain Management: Electronic Signature(s) Signed: 10/04/2016 5:06:34 PM By: Janet Gurney, RN, BSN, Kim RN, BSN Entered By: Janet Gurney, RN, BSN, [name] on 10/04/2016 13:27:41 [name], [name] ([id number]) -------------------------------------------------------------------------------- Patient/Caregiver Education Details [name] Date of Service: 10/04/2016 1:30 PM Medical Record Number: [id number] Patient Account Number: [id number] Date of Birth/Gender: Apr 17, 1946 (70 y.o. Female) Treating RN: [name] Primary Care Physician: Milon Score Other Clinician: Referring Physician: Treating Physician/Extender: [name] in Treatment: 3 Education Assessment Education Provided To: Patient Education Topics Provided Wound Debridement: Handouts: Wound Debridement Methods: Demonstration, Explain/Verbal Responses: State content correctly Electronic Signature(s) Signed: 10/04/2016 5:06:34 PM By: Janet Gurney, RN, BSN, Kim RN, BSN Entered By: Janet Gurney, RN, BSN, [name] on 10/04/2016 14:04:01 [name] ([id number]) -------------------------------------------------------------------------------- Wound Assessment Details [name] Date of Service: 10/04/2016 1:30 PM Medical Record Number: [id number] Patient Account Number: [id number] Date of Birth/Sex: 1945-12-11 (70 y.o. Female) Treating RN: [name] Primary Care Corianne Buccellato: Milon Score Other Clinician: Referring Caedence Snowden: Treating Donye Campanelli/Extender: [name] in Treatment: 3 Wound Status Wound Number: 1 Primary Trauma, Other Etiology: Wound Location: Right Lower Leg - Anterior, Proximal Wound Open Status: Wounding Event: Trauma Comorbid Chronic Obstructive Pulmonary Date Acquired: 08/17/2016 History: Disease (COPD),  Hypertension, Type Weeks Of Treatment: 3 II Diabetes Clustered Wound: No Photos Wound Measurements Length: (cm) 2.1 Width: (cm) 1.5 Depth: (cm) 0.4 Area: (cm) 2.474 Volume: (cm) 0.99 % Reduction in Area: 32.3% % Reduction in Volume: -171.2% Epithelialization: None Undermining: Yes Starting Position (o'clock): 9 Ending Position (o'clock): 3 Maximum Distance: (cm) 0.5 Wound Description Full Thickness Without Foul Odor Aft Classification: Exposed Support Structures Slough/Fibrin Diabetic Severity Grade 1 (Wagner): Wound Margin: Flat and Intact Exudate Amount: Large Exudate Type: Serous Exudate Color: amber er Cleansing: No o No Wound Bed Granulation Amount: Large (67-100%) Exposed Structure Criswell, Stephanie C. ([id number]) Granulation Quality: Red Fascia Exposed: No Necrotic Amount: Small (1-33%) Fat Layer (Subcutaneous Tissue) Exposed: Yes Necrotic Quality: Adherent Slough Tendon Exposed: No Muscle Exposed: No Joint Exposed: No Bone Exposed: No Periwound Skin Texture Texture Color No Abnormalities Noted: No No Abnormalities Noted: No Callus: No Atrophie Blanche: No Crepitus: No Cyanosis: No Excoriation: No Ecchymosis: Yes Induration: Yes Erythema: No Rash: No Hemosiderin Staining: No Scarring: No Mottled: No Pallor: No Moisture Rubor: No No Abnormalities Noted: No Dry / Scaly: No Temperature / Pain Maceration: Yes Temperature: No Abnormality Tenderness on Palpation: Yes Wound Preparation Ulcer Cleansing: Rinsed/Irrigated with Saline Topical Anesthetic Applied: Other: lidocaine 4%, Treatment Notes Wound #1 (Right, Proximal, Anterior Lower Leg) 1. Cleansed with: Clean wound with Normal Saline 2. Anesthetic Topical Lidocaine 4% cream to wound bed prior to debridement Notes snap vac applied today Electronic Signature(s) Signed: 10/04/2016 5:06:34 PM By: Janet Gurney, RN, BSN, Kim RN, BSN Entered By: Janet Gurney, RN, BSN, [name] on 10/04/2016  13:36:20 [name], [name] ([id number]) -------------------------------------------------------------------------------- Vitals Details [name] Date of Service: 10/04/2016  1:30 PM Medical Record Number: [id number] Patient Account Number: [id number] Date of Birth/Sex: 12-18-1945 (71 y.o. Female) Treating RN: [name] Primary Care Eian Vandervelden: Milon Score Other Clinician: Referring Storey Stangeland: Treating Johnathin Vanderschaaf/Extender: [name] in Treatment: 3 Vital Signs Time Taken: 13:29 Temperature (F): 97.2 Height (in): 67 Pulse (bpm): 83 Weight (lbs): 197 Respiratory Rate (breaths/min): 16 Body Mass Index (BMI): 30.9 Blood Pressure (mmHg): 154/64 Reference Range: 80 - 120 mg / dl Electronic Signature(s) Signed: 10/04/2016 5:06:34 PM By: Janet Gurney, RN, BSN, Kim RN, BSN Entered By: Janet Gurney, RN, BSN, [name] on 10/04/2016 13:30:21

## 2016-10-05 NOTE — Progress Notes (Signed)
[name], Aries C. ([id number]) Visit Report for 10/04/2016 Chief Complaint Document Details [name], [name] Date of Service: 10/04/2016 1:30 PM Medical Record Number: [id number] Patient Account Number: [id number] Date of Birth/Sex: 1945-11-27 (71 y.o. Female) Treating RN: Huel CoventryWoody, Kim Primary Care Provider: Milon ScoreJONES, CARON Other Clinician: Referring Provider: Treating Provider/Extender: Rudene ReBritto, Grete Bosko Weeks in Treatment: 3 Information Obtained from: Patient Chief Complaint Patients presents for treatment of an open diabetic ulcer due to a injury to her right lower extremity about 4 weeks Electronic Signature(s) Signed: 10/04/2016 2:00:58 PM By: Evlyn KannerBritto, Woodruff Skirvin MD, FACS Entered By: Evlyn KannerBritto, Dale Ribeiro on 10/04/2016 14:00:57 [name], [name] ([id number]) -------------------------------------------------------------------------------- Debridement Details [name], [name] Date of Service: 10/04/2016 1:30 PM Medical Record Number: [id number] Patient Account Number: [id number] Date of Birth/Sex: 1945-11-27 (71 y.o. Female) Treating RN: Huel CoventryWoody, Kim Primary Care Provider: Milon ScoreJONES, CARON Other Clinician: Referring Provider: Treating Provider/Extender: Rudene ReBritto, Zaina Jenkin Weeks in Treatment: 3 Debridement Performed for Wound #1 Right,Proximal,Anterior Lower Leg Assessment: Performed By: Physician Evlyn KannerBritto, Leeon Makar, MD Debridement: Debridement Pre-procedure Yes - 13:43 Verification/Time Out Taken: Start Time: 13:44 Pain Control: Other : lidocaine 4% Level: Skin/Subcutaneous Tissue Total Area Debrided (L x 2.1 (cm) x 1.5 (cm) = 3.15 (cm) W): Tissue and other Viable, Non-Viable, Fibrin/Slough, Subcutaneous material debrided: Instrument: Curette Bleeding: Minimum Hemostasis Achieved: Pressure End Time: 13:44 Procedural Pain: 0 Post Procedural Pain: 0 Response to Treatment: Procedure was tolerated well Post Debridement Measurements of Total Wound Length: (cm) 2.1 Width: (cm)  1.5 Depth: (cm) 0.4 Volume: (cm) 0.99 Character of Wound/Ulcer Post Requires Further Debridement Debridement: Severity of Tissue Post Debridement: Fat layer exposed Post Procedure Diagnosis Same as Pre-procedure Electronic Signature(s) Signed: 10/04/2016 2:00:46 PM By: Evlyn KannerBritto, Rashad Auld MD, FACS Signed: 10/04/2016 5:06:34 PM By: Elliot GurneyWoody, RN, BSN, Kim RN, BSN Entered By: Evlyn KannerBritto, Blossom Crume on 10/04/2016 14:00:46 [name], Madelaine C. ([id number]) [name], Shanzay C. ([id number]) -------------------------------------------------------------------------------- HPI Details [name], [name] Date of Service: 10/04/2016 1:30 PM Medical Record Number: [id number] Patient Account Number: [id number] Date of Birth/Sex: 1945-11-27 (71 y.o. Female) Treating RN: Huel CoventryWoody, Kim Primary Care Provider: Milon ScoreJONES, CARON Other Clinician: Referring Provider: Treating Provider/Extender: Rudene ReBritto, Evone Arseneau Weeks in Treatment: 3 History of Present Illness Location: right lower extremity in the area of the lateral calf below the knee Quality: Patient reports experiencing a sharp pain to affected area(s). Severity: Patient states wound are getting worse. Duration: Patient has had the wound for < 4 weeks prior to presenting for treatment Timing: Pain in wound is constant (hurts all the time) Context: The wound occurred when the patient a blunt injury and fall Modifying Factors: Other treatment(s) tried include:courses of doxycycline and local Bactroban ointment Associated Signs and Symptoms: Patient reports having increase discharge. HPI Description: 71 year old patient has been referred to as with right lower extremity wound with edema and ecchymosis, after having a injury due to a fall on 08/20/2016. she has a history of diabetes mellitus type 2 which is uncomplicated, chronic back pain, COPD, hypertension, hypothyroidism, supraventricular tachycardia and tobacco abuse. He is status post neck surgery, appendectomy,  cholecystectomy, hernia repair. He smokes about half a pack of cigarettes a day for the last 50 years. Recently on doxycycline for 10 days and was given symptomatic treatment for wound. 10/04/2016 -- the patient tolerated the snap vacuum system well and had no significant problems. She continues to smoke and is trying to give it up. Electronic Signature(s) Signed: 10/04/2016 2:01:29 PM By: Evlyn KannerBritto, Le Faulcon MD, FACS Entered By: Evlyn KannerBritto, Aneesa Romey on 10/04/2016 14:01:29 [name], Janet  Salena [name] ([id number]) -------------------------------------------------------------------------------- Physical Exam Details [name], [name] Date of Service: 10/04/2016 1:30 PM Medical Record Number: [id number] Patient Account Number: [id number] Date of Birth/Sex: 11/12/1945 (71 y.o. Female) Treating RN: Huel Coventry Primary Care Provider: Milon Score Other Clinician: Referring Provider: Treating Provider/Extender: Rudene Re in Treatment: 3 Constitutional . Pulse regular. Respirations normal and unlabored. Afebrile. . Eyes Nonicteric. Reactive to light. Ears, Nose, Mouth, and Throat Lips, teeth, and gums WNL.Marland Kitchen Moist mucosa without lesions. Neck supple and nontender. No palpable supraclavicular or cervical adenopathy. Normal sized without goiter. Respiratory WNL. No retractions.. Breath sounds WNL, No rubs, rales, rhonchi, or wheeze.. Cardiovascular Heart rhythm and rate regular, no murmur or gallop.. Pedal Pulses WNL. No clubbing, cyanosis or edema. Chest Breasts symmetical and no nipple discharge.. Breast tissue WNL, no masses, lumps, or tenderness.. Lymphatic No adneopathy. No adenopathy. No adenopathy. Musculoskeletal Adexa without tenderness or enlargement.. Digits and nails w/o clubbing, cyanosis, infection, petechiae, ischemia, or inflammatory conditions.. Integumentary (Hair, Skin) No suspicious lesions. No crepitus or fluctuance. No peri-wound warmth or erythema. No  masses.Marland Kitchen Psychiatric Judgement and insight Intact.. No evidence of depression, anxiety, or agitation.. Notes there is some healthy granulation tissue but she did require some debridement of the subcutis tissue especially around the undermining. A snap vacuum device was then applied over the wound. Electronic Signature(s) Signed: 10/04/2016 2:01:55 PM By: Evlyn Kanner MD, FACS Entered By: Evlyn Kanner on 10/04/2016 14:01:55 [name], [name] ([id number]) -------------------------------------------------------------------------------- Physician Orders Details [name] Date of Service: 10/04/2016 1:30 PM Medical Record Number: [id number] Patient Account Number: [id number] Date of Birth/Sex: 04/29/1946 (71 y.o. Female) Treating RN: Huel Coventry Primary Care Provider: Milon Score Other Clinician: Referring Provider: Treating Provider/Extender: Rudene Re in Treatment: 3 Verbal / Phone Orders: No Diagnosis Coding Wound Cleansing Wound #1 Right,Proximal,Anterior Lower Leg o Clean wound with Normal Saline. o May Shower, gently pat wound dry prior to applying new dressing. Anesthetic Wound #1 Right,Proximal,Anterior Lower Leg o Topical Lidocaine 4% cream applied to wound bed prior to debridement Skin Barriers/Peri-Wound Care Wound #1 Right,Proximal,Anterior Lower Leg o Skin Prep Dressing Change Frequency Wound #1 Right,Proximal,Anterior Lower Leg o Change dressing every week Follow-up Appointments Wound #1 Right,Proximal,Anterior Lower Leg o Return Appointment in 1 week. o Nurse Visit as needed Edema Control Wound #1 Right,Proximal,Anterior Lower Leg o Elevate legs to the level of the heart and pump ankles as often as possible Additional Orders / Instructions Wound #1 Right,Proximal,Anterior Lower Leg o Stop Smoking o Increase protein intake. o Other: - Please try to keep blood sugars below 180 Negative Pressure Wound  Therapy Wound #1 Right,Proximal,Anterior Lower Leg o Other: - SNAP Vac continuous at 125 mmHg [name], [name] ([id number]) Electronic Signature(s) Signed: 10/04/2016 4:18:08 PM By: Evlyn Kanner MD, FACS Signed: 10/04/2016 5:06:34 PM By: Elliot Gurney RN, BSN, Kim RN, BSN Entered By: Elliot Gurney, RN, BSN, Kim on 10/04/2016 13:46:05 MAAT, KAFER ([id number]) -------------------------------------------------------------------------------- Problem List Details [name] Date of Service: 10/04/2016 1:30 PM Medical Record Number: [id number] Patient Account Number: [id number] Date of Birth/Sex: 1945/09/14 (71 y.o. 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 Female) Treating RN: Huel Coventry Primary Care Provider: Milon Score Other Clinician: Referring Provider: Treating Provider/Extender: Rudene Re in Treatment: 3 Subjective Chief Complaint Information  obtained from Patient Patients presents for treatment of an open diabetic ulcer due to a injury to her right lower extremity about 4 weeks History of Present Illness (HPI) The following HPI elements were documented for the patient's wound: Location: right lower extremity in the area of the lateral calf below the knee Quality: Patient reports experiencing a sharp pain to affected area(s). Severity: Patient states wound are getting worse. Duration: Patient has had the wound for < 4 weeks prior to presenting for treatment Timing: Pain in wound is constant (hurts all the time) Context: The wound occurred when the patient a blunt injury and fall Modifying Factors: Other treatment(s) tried include:courses of doxycycline and local Bactroban ointment Associated Signs and Symptoms: Patient reports having increase discharge. 71 year old patient has been referred to as with right lower extremity wound with edema and ecchymosis, after having a injury due to a fall on 08/20/2016. she has a history of diabetes mellitus type 2 which is uncomplicated, chronic back pain, COPD, hypertension, hypothyroidism, supraventricular tachycardia and tobacco abuse. He is status post neck surgery, appendectomy, cholecystectomy, hernia repair. He smokes about half a pack of cigarettes a day for the last 50 years. Recently on doxycycline for 10 days and was given symptomatic treatment for wound. 10/04/2016 -- the patient tolerated the snap vacuum system well and had no significant problems. She continues to smoke and is trying to give it up. Objective Constitutional Pulse regular. Respirations normal and unlabored. Afebrile. Vitals Time Taken: 1:29 PM, Height: 67 in, Weight: 197 lbs, BMI: 30.9, Temperature: 97.2 F, Pulse: 83 bpm, Respiratory Rate: 16 breaths/min, Blood Pressure: 154/64 mmHg. [name], [name] ([id number]) Eyes Nonicteric. Reactive to light. Ears, Nose, Mouth, and Throat Lips, teeth, and gums WNL.Marland Kitchen  Moist mucosa without lesions. Neck supple and nontender. No palpable supraclavicular or cervical adenopathy. Normal sized without goiter. Respiratory WNL. No retractions.. Breath sounds WNL, No rubs, rales, rhonchi, or wheeze.. Cardiovascular Heart rhythm and rate regular, no murmur or gallop.. Pedal Pulses WNL. No clubbing, cyanosis or edema. Chest Breasts symmetical and no nipple discharge.. Breast tissue WNL, no masses, lumps, or tenderness.. Lymphatic No adneopathy. No adenopathy. No adenopathy. Musculoskeletal Adexa without tenderness or enlargement.. Digits and nails w/o clubbing, cyanosis, infection, petechiae, ischemia, or inflammatory conditions.Marland Kitchen Psychiatric Judgement and insight Intact.. No evidence of depression, anxiety, or agitation.. General Notes: there is some healthy granulation tissue but she did require some debridement of the subcutis tissue especially around the undermining. A snap vacuum device was then applied over the wound. Integumentary (Hair, Skin) No suspicious lesions. No crepitus or fluctuance. No peri-wound warmth or erythema. No masses.. Wound #1 status is Open. Original cause of wound was Trauma. The wound is located on the Right,Proximal,Anterior Lower Leg. The wound measures 2.1cm length x 1.5cm width x 0.4cm depth; 2.474cm^2 area and 0.99cm^3 volume. There is Fat Layer (Subcutaneous Tissue) Exposed exposed. There is undermining starting at 9:00 and ending at 3:00 with a maximum  distance of 0.5cm. There is a large amount of serous drainage noted. The wound margin is flat and intact. There is large (67-100%) red granulation within the wound bed. There is a small (1-33%) amount of necrotic tissue within the wound bed including Adherent Slough. The periwound skin appearance exhibited: Induration, Maceration, Ecchymosis. The periwound skin appearance did not exhibit: Callus, Crepitus, Excoriation, Rash, Scarring, Dry/Scaly, Atrophie Blanche, Cyanosis,  Hemosiderin Staining, Mottled, Pallor, Rubor, Erythema. Periwound temperature was noted as No Abnormality. The periwound has tenderness on palpation. LATIA, MATAYA ([id number]) Assessment Active Problems ICD-10 E11.622 - Type 2 diabetes mellitus with other skin ulcer M70.861 - Other soft tissue disorders related to use, overuse and pressure, right lower leg L97.212 - Non-pressure chronic ulcer of right calf with fat layer exposed F17.218 - Nicotine dependence, cigarettes, with other nicotine-induced disorders Procedures Wound #1 Wound #1 is a Trauma, Other located on the Right,Proximal,Anterior Lower Leg . There was a Skin/Subcutaneous Tissue Debridement ([id number]) debridement with total area of 3.15 sq cm performed by Evlyn Kanner, MD. with the following instrument(s): Curette to remove Viable and Non-Viable tissue/material including Fibrin/Slough and Subcutaneous after achieving pain control using Other (lidocaine 4%). A time out was conducted at 13:43, prior to the start of the procedure. A Minimum amount of bleeding was controlled with Pressure. The procedure was tolerated well with a pain level of 0 throughout and a pain level of 0 following the procedure. Post Debridement Measurements: 2.1cm length x 1.5cm width x 0.4cm depth; 0.99cm^3 volume. Character of Wound/Ulcer Post Debridement requires further debridement. Severity of Tissue Post Debridement is: Fat layer exposed. Post procedure Diagnosis Wound #1: Same as Pre-Procedure Plan Wound Cleansing: Wound #1 Right,Proximal,Anterior Lower Leg: Clean wound with Normal Saline. May Shower, gently pat wound dry prior to applying new dressing. Anesthetic: Wound #1 Right,Proximal,Anterior Lower Leg: Topical Lidocaine 4% cream applied to wound bed prior to debridement Skin Barriers/Peri-Wound Care: Wound #1 Right,Proximal,Anterior Lower Leg: Skin Prep Dressing Change Frequency: Wound #1 Right,Proximal,Anterior Lower  Leg: Pileggi, Barbra C. ([id number]) Change dressing every week Follow-up Appointments: Wound #1 Right,Proximal,Anterior Lower Leg: Return Appointment in 1 week. Nurse Visit as needed Edema Control: Wound #1 Right,Proximal,Anterior Lower Leg: Elevate legs to the level of the heart and pump ankles as often as possible Additional Orders / Instructions: Wound #1 Right,Proximal,Anterior Lower Leg: Stop Smoking Increase protein intake. Other: - Please try to keep blood sugars below 180 Negative Pressure Wound Therapy: Wound #1 Right,Proximal,Anterior Lower Leg: Other: - SNAP Vac continuous at 125 mmHg I have recommended: 1. care of the snap vacuum device has been discussed with them and we will see her back next week for a change 2. good control of her diabetes mellitus 3. adequate protein, vitamin C, vitamin A and zinc 4. regular review at the wound center. Electronic Signature(s) Signed: 10/04/2016 2:02:28 PM By: Evlyn Kanner MD, FACS Entered By: Evlyn Kanner on 10/04/2016 14:02:28 WRENLY, LAURITSEN ([id number]) -------------------------------------------------------------------------------- SuperBill Details [name] Date of Service: 10/04/2016 Medical Record Number: [id number] Patient Account Number: [id number] Date of Birth/Sex: 1945/11/14 (71 y.o. Female) Treating RN: Huel Coventry Primary Care Provider: Milon Score Other Clinician: Referring Provider: Treating Provider/Extender: Evlyn Kanner Service Line: Outpatient Weeks in Treatment: 3 Diagnosis Coding ICD-10 Codes Code Description E11.622 Type 2 diabetes mellitus with other skin ulcer M70.861 Other soft tissue disorders related to use, overuse and pressure, right lower leg L97.212 Non-pressure chronic ulcer of right calf with fat layer exposed F17.218 Nicotine dependence, cigarettes, with other nicotine-induced  disorders Facility Procedures CPT4: Description Modifier Quantity Code [id number] 11042 -  DEB SUBQ TISSUE 20 SQ CM/< 1 ICD-10 Description Diagnosis E11.622 Type 2 diabetes mellitus with other skin ulcer M70.861 Other soft tissue disorders related to use, overuse and pressure, right  lower leg L97.212 Non-pressure chronic ulcer of right calf with fat layer exposed F17.218 Nicotine dependence, cigarettes, with other nicotine-induced disorders Physician Procedures CPT4: Description Modifier Quantity Code 5409811 11042 - WC PHYS SUBQ TISS 20 SQ CM 1 ICD-10 Description Diagnosis E11.622 Type 2 diabetes mellitus with other skin ulcer M70.861 Other soft tissue disorders related to use, overuse and pressure, right  lower leg L97.212 Non-pressure chronic ulcer of right calf with fat layer exposed F17.218 Nicotine dependence, cigarettes, with other nicotine-induced disorders Electronic Signature(s) Signed: 10/04/2016 2:02:44 PM By: Evlyn Kanner MD, FACS Disney, Pincus Sanes ([id number]) Entered By: Evlyn Kanner on 10/04/2016 14:02:44

## 2016-10-11 ENCOUNTER — Encounter: Payer: Medicare Other | Admitting: Surgery

## 2016-10-11 DIAGNOSIS — E11622 Type 2 diabetes mellitus with other skin ulcer: Secondary | ICD-10-CM | POA: Diagnosis not present

## 2016-10-13 NOTE — Progress Notes (Signed)
Janet, [name] ([id number]) Visit Report for 10/11/2016 Arrival Information Details [name], [name] Date of Service: 10/11/2016 2:30 PM Medical Record Number: [id number] Patient Account Number: [id number] Date of Birth/Sex: Sep 21, 1945 (70 y.o. Female) Treating RN: Curtis Sites Primary Care Rosena Bartle: Milon Score Other Clinician: Referring Anjolaoluwa Siguenza: Treating Mehreen Azizi/Extender: Rudene Re in Treatment: 4 Visit Information History Since Last Visit Added or deleted any medications: No Patient Arrived: Ambulatory Any new allergies or adverse reactions: No Arrival Time: 14:44 Had a fall or experienced change in No Accompanied By: dtr in law activities of daily living that may affect Transfer Assistance: None risk of falls: Patient Identification Verified: Yes Signs or symptoms of abuse/neglect since last No Secondary Verification Process Yes visito Completed: Hospitalized since last visit: No Patient Has Alerts: Yes Has Dressing in Place as Prescribed: Yes Patient Alerts: DMII Pain Present Now: No Electronic Signature(s) Signed: 10/11/2016 4:48:01 PM By: Curtis Sites Entered By: Curtis Sites on 10/11/2016 14:44:54 [name] ([id number]) -------------------------------------------------------------------------------- Encounter Discharge Information Details [name] Date of Service: 10/11/2016 2:30 PM Medical Record Number: [id number] Patient Account Number: [id number] Date of Birth/Sex: [birth year] (70 y.o. Female) Treating RN: Curtis Sites Primary Care Lauro Manlove: Milon Score Other Clinician: Referring Acey Woodfield: Treating Molleigh Huot/Extender: Rudene Re in Treatment: 4 Encounter Discharge Information Items Discharge Pain Level: 0 Discharge Condition: Stable Ambulatory Status: Ambulatory Discharge Destination: Home Transportation: Private Auto Accompanied By: dtr in law Schedule Follow-up Appointment:  Yes Medication Reconciliation completed and provided to Patient/Care No Shelton Soler: Provided on Clinical Summary of Care: 10/11/2016 Form Type Recipient Paper Patient ML Electronic Signature(s) Signed: 10/11/2016 3:19:46 PM By: Gwenlyn Perking Entered By: Gwenlyn Perking on 10/11/2016 15:19:46 [name] ([id number]) -------------------------------------------------------------------------------- Lower Extremity Assessment Details [name] Date of Service: 10/11/2016 2:30 PM Medical Record Number: [id number] Patient Account Number: [id number] Date of Birth/Sex: 1945-09-10 (70 y.o. Female) Treating RN: Curtis Sites Primary Care Nasia Cannan: Milon Score Other Clinician: Referring Pinkie Manger: Treating Hailley Byers/Extender: Rudene Re in Treatment: 4 Edema Assessment Assessed: [Left: No] [Right: No] Edema: [Left: N] [Right: o] Vascular Assessment Pulses: Dorsalis Pedis Palpable: [Right:Yes] Posterior Tibial Extremity colors, hair growth, and conditions: Extremity Color: [Right:Normal] Hair Growth on Extremity: [Right:Yes] Temperature of Extremity: [Right:Warm] Capillary Refill: [Right:< 3 seconds] Electronic Signature(s) Signed: 10/11/2016 4:48:01 PM By: Curtis Sites Entered By: Curtis Sites on 10/11/2016 14:57:37 [name] ([id number]) -------------------------------------------------------------------------------- Multi Wound Chart Details [name] Date of Service: 10/11/2016 2:30 PM Medical Record Number: [id number] Patient Account Number: [id number] Date of Birth/Sex: 07-04-1946 (70 y.o. Female) Treating RN: Curtis Sites Primary Care Hopie Pellegrin: Milon Score Other Clinician: Referring Larson Limones: Treating Taneeka Curtner/Extender: Rudene Re in Treatment: 4 Vital Signs Height(in): 67 Pulse(bpm): 86 Weight(lbs): 197 Blood Pressure 145/65 (mmHg): Body Mass Index(BMI): 31 Temperature(F): 98.1 Respiratory  Rate 18 (breaths/min): Photos: [1:No Photos] [N/A:N/A] Wound Location: [1:Right Lower Leg - Anterior, Proximal] [N/A:N/A] Wounding Event: [1:Trauma] [N/A:N/A] Primary Etiology: [1:Trauma, Other] [N/A:N/A] Comorbid History: [1:Chronic Obstructive Pulmonary Disease (COPD), Hypertension, Type II Diabetes] [N/A:N/A] Date Acquired: [1:08/17/2016] [N/A:N/A] Weeks of Treatment: [1:4] [N/A:N/A] Wound Status: [1:Open] [N/A:N/A] Measurements L x W x D 2.1x1.9x0.3 [N/A:N/A] (cm) Area (cm) : [1:3.134] [N/A:N/A] Volume (cm) : [1:0.94] [N/A:N/A] % Reduction in Area: [1:14.20%] [N/A:N/A] % Reduction in Volume: -157.50% [N/A:N/A] Classification: [1:Full Thickness Without Exposed Support Structures] [N/A:N/A] HBO Classification: [1:Grade 1] [N/A:N/A] Exudate Amount: [1:Large] [N/A:N/A] Exudate Type: [1:Serous] [N/A:N/A] Exudate Color: [1:amber] [N/A:N/A] Wound Margin: [1:Flat and Intact] [N/A:N/A] Granulation Amount: [1:Large (67-100%)] [N/A:N/A] Granulation  Quality: [1:Red] [N/A:N/A] Necrotic Amount: [1:Small (1-33%)] [N/A:N/A] Exposed Structures: [N/A:N/A] Fat Layer (Subcutaneous Tissue) Exposed: Yes Fascia: No Tendon: No Muscle: No Joint: No Bone: No Epithelialization: None N/A N/A Debridement: Debridement (53664- N/A N/A 11047) Pre-procedure 15:06 N/A N/A Verification/Time Out Taken: Pain Control: Lidocaine 4% Topical N/A N/A Solution Tissue Debrided: Fibrin/Slough, N/A N/A Subcutaneous Level: Skin/Subcutaneous N/A N/A Tissue Debridement Area (sq 3.99 N/A N/A cm): Instrument: Curette N/A N/A Bleeding: Minimum N/A N/A Hemostasis Achieved: Pressure N/A N/A Procedural Pain: 0 N/A N/A Post Procedural Pain: 0 N/A N/A Debridement Treatment Procedure was tolerated N/A N/A Response: well Post Debridement 2.1x1.9x0.4 N/A N/A Measurements L x W x D (cm) Post Debridement 1.253 N/A N/A Volume: (cm) Periwound Skin Texture: Induration: Yes N/A N/A Excoriation: No Callus:  No Crepitus: No Rash: No Scarring: No Periwound Skin Maceration: Yes N/A N/A Moisture: Dry/Scaly: No Periwound Skin Color: Ecchymosis: Yes N/A N/A Atrophie Blanche: No Cyanosis: No Erythema: No Hemosiderin Staining: No Mottled: No Pallor: No Rubor: No Temperature: No Abnormality N/A N/A Hammitt, Edana C. ([id number]) Tenderness on Yes N/A N/A Palpation: Wound Preparation: Ulcer Cleansing: N/A N/A Rinsed/Irrigated with Saline Topical Anesthetic Applied: Other: lidocaine 4% Procedures Performed: Debridement N/A N/A Treatment Notes Electronic Signature(s) Signed: 10/11/2016 3:21:40 PM By: Evlyn Kanner MD, FACS Entered By: Evlyn Kanner on 10/11/2016 15:21:40 JAZMINN, POMALES ([id number]) -------------------------------------------------------------------------------- Multi-Disciplinary Care Plan Details [name] Date of Service: 10/11/2016 2:30 PM Medical Record Number: [id number] Patient Account Number: [id number] Date of Birth/Sex: 1946/01/05 (70 y.o. Female) Treating RN: Curtis Sites Primary Care Keene Gilkey: Milon Score Other Clinician: Referring Daquavion Catala: Treating Neko Mcgeehan/Extender: Rudene Re in Treatment: 4 Active Inactive ` Necrotic Tissue Nursing Diagnoses: Impaired tissue integrity related to necrotic/devitalized tissue Goals: Necrotic/devitalized tissue will be minimized in the wound bed Date Initiated: 09/13/2016 Target Resolution Date: 10/18/2016 Goal Status: Active Interventions: Assess patient pain level pre-, during and post procedure and prior to discharge Notes: ` Orientation to the Wound Care Program Nursing Diagnoses: Knowledge deficit related to the wound healing center program Goals: Patient/caregiver will verbalize understanding of the Wound Healing Center Program Date Initiated: 09/13/2016 Target Resolution Date: 12/20/2016 Goal Status: Active Interventions: Provide education on orientation to the wound  center Notes: ` Wound/Skin Impairment Nursing Diagnoses: Impaired tissue integrity IMOGEAN, CIAMPA ([id number]) Goals: Patient/caregiver will verbalize understanding of skin care regimen Date Initiated: 09/13/2016 Target Resolution Date: 12/20/2016 Goal Status: Active Ulcer/skin breakdown will have a volume reduction of 30% by week 4 Date Initiated: 09/13/2016 Target Resolution Date: 12/20/2016 Goal Status: Active Ulcer/skin breakdown will have a volume reduction of 50% by week 8 Date Initiated: 09/13/2016 Target Resolution Date: 12/20/2016 Goal Status: Active Ulcer/skin breakdown will have a volume reduction of 80% by week 12 Date Initiated: 09/13/2016 Target Resolution Date: 12/20/2016 Goal Status: Active Ulcer/skin breakdown will heal within 14 weeks Date Initiated: 09/13/2016 Target Resolution Date: 12/20/2016 Goal Status: Active Interventions: Assess patient/caregiver ability to obtain necessary supplies Assess patient/caregiver ability to perform ulcer/skin care regimen upon admission and as needed Assess ulceration(s) every visit Notes: Electronic Signature(s) Signed: 10/11/2016 4:48:01 PM By: Curtis Sites Entered By: Curtis Sites on 10/11/2016 15:05:30 [name] ([id number]) -------------------------------------------------------------------------------- Pain Assessment Details [name] Date of Service: 10/11/2016 2:30 PM Medical Record Number: [id number] Patient Account Number: [id number] Date of Birth/Sex: 21-Jun-1946 (70 y.o. Female) Treating RN: Curtis Sites Primary Care Taron Conrey: Milon Score Other Clinician: Referring Demetries Coia: Treating Leylani Duley/Extender: Rudene Re in Treatment: 4 Active Problems Location of Pain Severity and  Description of Pain Patient Has Paino Yes Site Locations Pain Location: Pain in Ulcers With Dressing Change: Yes Duration of the Pain. Constant / Intermittento Intermittent Character of Pain Describe the  Pain: Other: like a hot poker Pain Management and Medication Current Pain Management: Notes Topical or injectable lidocaine is offered to patient for acute pain when surgical debridement is performed. If needed, Patient is instructed to use over the counter pain medication for the following 24-48 hours after debridement. Wound care MDs do not prescribed pain medications. Patient has chronic pain or uncontrolled pain. Patient has been instructed to make an appointment with their Primary Care Physician for pain management. Electronic Signature(s) Signed: 10/11/2016 4:48:01 PM By: Curtis Sites Entered By: Curtis Sites on 10/11/2016 14:45:17 [name] ([id number]) -------------------------------------------------------------------------------- Patient/Caregiver Education Details [name] Date of Service: 10/11/2016 2:30 PM Medical Record Number: [id number] Patient Account Number: [id number] Date of Birth/Gender: [birth year] (70 y.o. Female) Treating RN: Curtis Sites Primary Care Physician: Milon Score Other Clinician: Referring Physician: Treating Physician/Extender: Rudene Re in Treatment: 4 Education Assessment Education Provided To: Patient and Caregiver Education Topics Provided Wound/Skin Impairment: Handouts: Other: care of snap vac Methods: Demonstration, Explain/Verbal Responses: State content correctly Electronic Signature(s) Signed: 10/11/2016 4:48:01 PM By: Curtis Sites Entered By: Curtis Sites on 10/11/2016 15:07:01 [name] ([id number]) -------------------------------------------------------------------------------- Wound Assessment Details [name] Date of Service: 10/11/2016 2:30 PM Medical Record Number: [id number] Patient Account Number: [id number] Date of Birth/Sex: 02/18/1946 (70 y.o. Female) Treating RN: Curtis Sites Primary Care Natilie Krabbenhoft: Milon Score Other Clinician: Referring  Shriley Joffe: Treating Shyla Gayheart/Extender: Rudene Re in Treatment: 4 Wound Status Wound Number: 1 Primary Trauma, Other Etiology: Wound Location: Right Lower Leg - Anterior, Proximal Wound Open Status: Wounding Event: Trauma Comorbid Chronic Obstructive Pulmonary Date Acquired: 08/17/2016 History: Disease (COPD), Hypertension, Type Weeks Of Treatment: 4 II Diabetes Clustered Wound: No Wound Measurements Length: (cm) 2.1 % Reduction Width: (cm) 1.9 % Reduction Depth: (cm) 0.3 Epitheliali Area: (cm) 3.134 Tunneling: Volume: (cm) 0.94 Underminin in Area: 14.2% in Volume: -157.5% zation: None No g: No Wound Description Full Thickness Without Classification: Exposed Support Structures Diabetic Severity Grade 1 (Wagner): Wound Margin: Flat and Intact Exudate Amount: Large Exudate Type: Serous Exudate Color: amber Foul Odor After Cleansing: No Slough/Fibrino No Wound Bed Granulation Amount: Large (67-100%) Exposed Structure Granulation Quality: Red Fascia Exposed: No Necrotic Amount: Small (1-33%) Fat Layer (Subcutaneous Tissue) Exposed: Yes Necrotic Quality: Adherent Slough Tendon Exposed: No Muscle Exposed: No Joint Exposed: No Bone Exposed: No Periwound Skin Texture Texture Color No Abnormalities Noted: No No Abnormalities Noted: No Callus: No Atrophie Blanche: No Churchman, Jolyssa C. ([id number]) Crepitus: No Cyanosis: No Excoriation: No Ecchymosis: Yes Induration: Yes Erythema: No Rash: No Hemosiderin Staining: No Scarring: No Mottled: No Pallor: No Moisture Rubor: No No Abnormalities Noted: No Dry / Scaly: No Temperature / Pain Maceration: Yes Temperature: No Abnormality Tenderness on Palpation: Yes Wound Preparation Ulcer Cleansing: Rinsed/Irrigated with Saline Topical Anesthetic Applied: Other: lidocaine 4%, Treatment Notes Wound #1 (Right, Proximal, Anterior Lower Leg) 1. Cleansed with: Clean wound with Normal Saline 2.  Anesthetic Topical Lidocaine 4% cream to wound bed prior to debridement 3. Peri-wound Care: Skin Prep 8. Negative Pressure Wound Therapy Wound Vac to wound continuously at 117mm/hg pressure Notes snap vac applied today Electronic Signature(s) Signed: 10/11/2016 4:48:01 PM By: Curtis Sites Entered By: Curtis Sites on 10/11/2016 14:57:20 Wedge, Pincus Sanes ([id number]) -------------------------------------------------------------------------------- Vitals Details [name] Date of Service:  10/11/2016 2:30 PM Medical Record Number: [id number] Patient Account Number: [id number] Date of Birth/Sex: [birth year] (71 y.o. Female) Treating RN: Curtis Sites Primary Care Andersyn Fragoso: Milon Score Other Clinician: Referring Jlynn Ly: Treating Glena Pharris/Extender: Rudene Re in Treatment: 4 Vital Signs Time Taken: 14:45 Temperature (F): 98.1 Height (in): 67 Pulse (bpm): 86 Weight (lbs): 197 Respiratory Rate (breaths/min): 18 Body Mass Index (BMI): 30.9 Blood Pressure (mmHg): 145/65 Reference Range: 80 - 120 mg / dl Electronic Signature(s) Signed: 10/11/2016 4:48:01 PM By: Curtis Sites Entered By: Curtis Sites on 10/11/2016 14:47:31

## 2016-10-13 NOTE — Progress Notes (Signed)
CAYLIE, SANDQUIST ([id number]) Visit Report for 10/11/2016 Chief Complaint Document Details [name], [name] Date of Service: 10/11/2016 2:30 PM Medical Record Number: [id number] Patient Account Number: [id number] Date of Birth/Sex: June 13, 1946 (71 y.o. Female) Treating RN: Curtis Sites Primary Care Provider: Milon Score Other Clinician: Referring Provider: Treating Provider/Extender: Rudene Re in Treatment: 4 Information Obtained from: Patient Chief Complaint Patients presents for treatment of an open diabetic ulcer due to a injury to her right lower extremity about 4 weeks Electronic Signature(s) Signed: 10/11/2016 3:22:05 PM By: Evlyn Kanner MD, FACS Entered By: Evlyn Kanner on 10/11/2016 15:22:04 [name] ([id number]) -------------------------------------------------------------------------------- Debridement Details [name] Date of Service: 10/11/2016 2:30 PM Medical Record Number: [id number] Patient Account Number: [id number] Date of Birth/Sex: [birth year] (71 y.o. Female) Treating RN: Curtis Sites Primary Care Provider: Milon Score Other Clinician: Referring Provider: Treating Provider/Extender: Rudene Re in Treatment: 4 Debridement Performed for Wound #1 Right,Proximal,Anterior Lower Leg Assessment: Performed By: Physician Evlyn Kanner, MD Debridement: Debridement Pre-procedure Yes - 15:06 Verification/Time Out Taken: Start Time: 15:06 Pain Control: Lidocaine 4% Topical Solution Level: Skin/Subcutaneous Tissue Total Area Debrided (L x 2.1 (cm) x 1.9 (cm) = 3.99 (cm) W): Tissue and other Viable, Non-Viable, Fibrin/Slough, Subcutaneous material debrided: Instrument: Curette Bleeding: Minimum Hemostasis Achieved: Pressure End Time: 15:08 Procedural Pain: 0 Post Procedural Pain: 0 Response to Treatment: Procedure was tolerated well Post Debridement Measurements of Total Wound Length: (cm)  2.1 Width: (cm) 1.9 Depth: (cm) 0.4 Volume: (cm) 1.253 Character of Wound/Ulcer Post Improved Debridement: Severity of Tissue Post Debridement: Fat layer exposed Post Procedure Diagnosis Same as Pre-procedure Electronic Signature(s) Signed: 10/11/2016 3:21:58 PM By: Evlyn Kanner MD, FACS Signed: 10/11/2016 4:48:01 PM By: Curtis Sites Previous Signature: 10/11/2016 3:21:48 PM Version By: Evlyn Kanner MD, FACS Entered By: Evlyn Kanner on 10/11/2016 15:21:58 [name] ([id number], [name] ([id number]) -------------------------------------------------------------------------------- HPI Details [name] Date of Service: 10/11/2016 2:30 PM Medical Record Number: [id number] Patient Account Number: [id number] Date of Birth/Sex: Oct 15, 1945 (71 y.o. Female) Treating RN: Curtis Sites Primary Care Provider: Milon Score Other Clinician: Referring Provider: Treating Provider/Extender: Rudene Re in Treatment: 4 History of Present Illness Location: right lower extremity in the area of the lateral calf below the knee Quality: Patient reports experiencing a sharp pain to affected area(s). Severity: Patient states wound are getting worse. Duration: Patient has had the wound for < 4 weeks prior to presenting for treatment Timing: Pain in wound is constant (hurts all the time) Context: The wound occurred when the patient a blunt injury and fall Modifying Factors: Other treatment(s) tried include:courses of doxycycline and local Bactroban ointment Associated Signs and Symptoms: Patient reports having increase discharge. HPI Description: 71 year old patient has been referred to as with right lower extremity wound with edema and ecchymosis, after having a injury due to a fall on 08/20/2016. she has a history of diabetes mellitus type 2 which is uncomplicated, chronic back pain, COPD, hypertension, hypothyroidism, supraventricular tachycardia and  tobacco abuse. He is status post neck surgery, appendectomy, cholecystectomy, hernia repair. He smokes about half a pack of cigarettes a day for the last 50 years. Recently on doxycycline for 10 days and was given symptomatic treatment for wound. 10/04/2016 -- the patient tolerated the snap vacuum system well and had no significant problems. She continues to smoke and is trying to give it up. 10/11/2016 -- she continues to tolerate the snap like him system fairly well and other  than that she has no fresh issues Electronic Signature(s) Signed: 10/11/2016 3:22:33 PM By: Evlyn Kanner MD, FACS Entered By: Evlyn Kanner on 10/11/2016 15:22:33 DORI, DEVINO ([id number]) -------------------------------------------------------------------------------- Physical Exam Details [name] Date of Service: 10/11/2016 2:30 PM Medical Record Number: [id number] Patient Account Number: [id number] Date of Birth/Sex: [birth year] (71 y.o. Female) Treating RN: Curtis Sites Primary Care Provider: Milon Score Other Clinician: Referring Provider: Treating Provider/Extender: Rudene Re in Treatment: 4 Constitutional . Pulse regular. Respirations normal and unlabored. Afebrile. . Eyes Nonicteric. Reactive to light. Ears, Nose, Mouth, and Throat Lips, teeth, and gums WNL.Marland Kitchen Moist mucosa without lesions. Neck supple and nontender. No palpable supraclavicular or cervical adenopathy. Normal sized without goiter. Respiratory WNL. No retractions.. Breath sounds WNL, No rubs, rales, rhonchi, or wheeze.. Cardiovascular Heart rhythm and rate regular, no murmur or gallop.. Pedal Pulses WNL. No clubbing, cyanosis or edema. Chest Breasts symmetical and no nipple discharge.. Breast tissue WNL, no masses, lumps, or tenderness.. Lymphatic No adneopathy. No adenopathy. No adenopathy. Musculoskeletal Adexa without tenderness or enlargement.. Digits and nails w/o clubbing, cyanosis,  infection, petechiae, ischemia, or inflammatory conditions.. Integumentary (Hair, Skin) No suspicious lesions. No crepitus or fluctuance. No peri-wound warmth or erythema. No masses.Marland Kitchen Psychiatric Judgement and insight Intact.. No evidence of depression, anxiety, or agitation.. Notes she has some undermining between the 7 and 11:00 position and this has been sharply debrided with a #3 curet and minimal bleeding controlled with pressure Electronic Signature(s) Signed: 10/11/2016 3:23:06 PM By: Evlyn Kanner MD, FACS Entered By: Evlyn Kanner on 10/11/2016 15:23:06 [name] ([id number]) -------------------------------------------------------------------------------- Physician Orders Details [name] Date of Service: 10/11/2016 2:30 PM Medical Record Number: [id number] Patient Account Number: [id number] Date of Birth/Sex: [birth year] (70 y.o. Female) Treating RN: Curtis Sites Primary Care Provider: Milon Score Other Clinician: Referring Provider: Treating Provider/Extender: Rudene Re in Treatment: 4 Verbal / Phone Orders: No Diagnosis Coding Wound Cleansing Wound #1 Right,Proximal,Anterior Lower Leg o Clean wound with Normal Saline. o May Shower, gently pat wound dry prior to applying new dressing. Anesthetic Wound #1 Right,Proximal,Anterior Lower Leg o Topical Lidocaine 4% cream applied to wound bed prior to debridement Skin Barriers/Peri-Wound Care Wound #1 Right,Proximal,Anterior Lower Leg o Skin Prep Dressing Change Frequency Wound #1 Right,Proximal,Anterior Lower Leg o Change dressing every week Follow-up Appointments Wound #1 Right,Proximal,Anterior Lower Leg o Return Appointment in 1 week. o Nurse Visit as needed Edema Control Wound #1 Right,Proximal,Anterior Lower Leg o Elevate legs to the level of the heart and pump ankles as often as possible Additional Orders / Instructions Wound #1 Right,Proximal,Anterior  Lower Leg o Stop Smoking o Increase protein intake. o Other: - Please try to keep blood sugars below 180 Negative Pressure Wound Therapy Wound #1 Right,Proximal,Anterior Lower Leg o Other: - SNAP Vac continuous at 125 mmHg ARYIAH, MONTEROSSO ([id number]) Electronic Signature(s) Signed: 10/11/2016 4:07:16 PM By: Evlyn Kanner MD, FACS Signed: 10/11/2016 4:48:01 PM By: Curtis Sites Entered By: Curtis Sites on 10/11/2016 15:08:20 LONDAN, COPLEN ([id number]) -------------------------------------------------------------------------------- Problem List Details [name] Date of Service: 10/11/2016 2:30 PM Medical Record Number: [id number] Patient Account Number: [id number] Date of Birth/Sex: 1945/12/24 (70 y.o. 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 Female) Treating RN: Curtis Sites Primary Care Provider: Milon Score Other  Clinician: Referring Provider: Treating Provider/Extender: Rudene Re in Treatment: 4 Subjective Chief Complaint Information obtained from Patient Patients presents for treatment of an open diabetic ulcer due to a injury to her right lower extremity about 4 weeks History of Present Illness (HPI) The following HPI elements were documented for the patient's wound: Location: right lower extremity in the area of the lateral calf below the knee Quality: Patient reports experiencing a sharp pain to affected area(s). Severity: Patient states wound are getting worse. Duration: Patient has had the wound for < 4 weeks prior to presenting for treatment Timing: Pain in wound is constant (hurts all the time) Context: The wound occurred when the patient a blunt injury and fall Modifying Factors: Other treatment(s) tried include:courses of doxycycline and local Bactroban ointment Associated Signs and Symptoms: Patient reports having increase discharge. 71 year old patient has been referred to as with right lower extremity wound with edema and ecchymosis, after having a injury due to a fall on 08/20/2016. she has a history of diabetes mellitus type 2 which is uncomplicated, chronic back pain, COPD, hypertension, hypothyroidism, supraventricular tachycardia and tobacco abuse. He is status post neck surgery, appendectomy, cholecystectomy, hernia repair. He smokes about half a pack of cigarettes a day for the last 50 years. Recently on doxycycline for 10 days and was given symptomatic treatment for wound. 10/04/2016 -- the patient tolerated the snap vacuum system well and had no significant problems. She continues to smoke and is trying to give it up. 10/11/2016 -- she continues to tolerate the snap like him system fairly well and other than that she has no fresh issues Objective Constitutional Pulse regular. Respirations normal and unlabored. Afebrile. BETTI, GOODENOW C. ([id number]) Vitals Time  Taken: 2:45 PM, Height: 67 in, Weight: 197 lbs, BMI: 30.9, Temperature: 98.1 F, Pulse: 86 bpm, Respiratory Rate: 18 breaths/min, Blood Pressure: 145/65 mmHg. Eyes Nonicteric. Reactive to light. Ears, Nose, Mouth, and Throat Lips, teeth, and gums WNL.Marland Kitchen Moist mucosa without lesions. Neck supple and nontender. No palpable supraclavicular or cervical adenopathy. Normal sized without goiter. Respiratory WNL. No retractions.. Breath sounds WNL, No rubs, rales, rhonchi, or wheeze.. Cardiovascular Heart rhythm and rate regular, no murmur or gallop.. Pedal Pulses WNL. No clubbing, cyanosis or edema. Chest Breasts symmetical and no nipple discharge.. Breast tissue WNL, no masses, lumps, or tenderness.. Lymphatic No adneopathy. No adenopathy. No adenopathy. Musculoskeletal Adexa without tenderness or enlargement.. Digits and nails w/o clubbing, cyanosis, infection, petechiae, ischemia, or inflammatory conditions.Marland Kitchen Psychiatric Judgement and insight Intact.. No evidence of depression, anxiety, or agitation.. General Notes: she has some undermining between the 7 and 11:00 position and this has been sharply debrided with a #3 curet and minimal bleeding controlled with pressure Integumentary (Hair, Skin) No suspicious lesions. No crepitus or fluctuance. No peri-wound warmth or erythema. No masses.. Wound #1 status is Open. Original cause of wound was Trauma. The wound is located on the Right,Proximal,Anterior Lower Leg. The wound  measures 2.1cm length x 1.9cm width x 0.3cm depth; 3.134cm^2 area and 0.94cm^3 volume. There is Fat Layer (Subcutaneous Tissue) Exposed exposed. There is no tunneling or undermining noted. There is a large amount of serous drainage noted. The wound margin is flat and intact. There is large (67-100%) red granulation within the wound bed. There is a small (1-33%) amount of necrotic tissue within the wound bed including Adherent Slough. The periwound skin appearance exhibited:  Induration, Maceration, Ecchymosis. The periwound skin appearance did not exhibit: Callus, Crepitus, Excoriation, Rash, Scarring, Dry/Scaly, Atrophie Blanche, Cyanosis, Hemosiderin Staining, Mottled, Pallor, Rubor, Erythema. Periwound temperature was noted as No Abnormality. The periwound has tenderness on palpation. TAWNIA, SCHIRM ([id number]) Assessment Active Problems ICD-10 E11.622 - Type 2 diabetes mellitus with other skin ulcer M70.861 - Other soft tissue disorders related to use, overuse and pressure, right lower leg L97.212 - Non-pressure chronic ulcer of right calf with fat layer exposed F17.218 - Nicotine dependence, cigarettes, with other nicotine-induced disorders Procedures Wound #1 Wound #1 is a Trauma, Other located on the Right,Proximal,Anterior Lower Leg . There was a Skin/Subcutaneous Tissue Debridement ([id number]) debridement with total area of 3.99 sq cm performed by Evlyn Kanner, MD. with the following instrument(s): Curette to remove Viable and Non-Viable tissue/material including Fibrin/Slough and Subcutaneous after achieving pain control using Lidocaine 4% Topical Solution. A time out was conducted at 15:06, prior to the start of the procedure. A Minimum amount of bleeding was controlled with Pressure. The procedure was tolerated well with a pain level of 0 throughout and a pain level of 0 following the procedure. Post Debridement Measurements: 2.1cm length x 1.9cm width x 0.4cm depth; 1.253cm^3 volume. Character of Wound/Ulcer Post Debridement is improved. Severity of Tissue Post Debridement is: Fat layer exposed. Post procedure Diagnosis Wound #1: Same as Pre-Procedure Plan Wound Cleansing: Wound #1 Right,Proximal,Anterior Lower Leg: Clean wound with Normal Saline. May Shower, gently pat wound dry prior to applying new dressing. Anesthetic: Wound #1 Right,Proximal,Anterior Lower Leg: Topical Lidocaine 4% cream applied to wound bed prior to  debridement Skin Barriers/Peri-Wound Care: Wound #1 Right,Proximal,Anterior Lower Leg: Skin Prep Bermea, Kirrah C. ([id number]) Dressing Change Frequency: Wound #1 Right,Proximal,Anterior Lower Leg: Change dressing every week Follow-up Appointments: Wound #1 Right,Proximal,Anterior Lower Leg: Return Appointment in 1 week. Nurse Visit as needed Edema Control: Wound #1 Right,Proximal,Anterior Lower Leg: Elevate legs to the level of the heart and pump ankles as often as possible Additional Orders / Instructions: Wound #1 Right,Proximal,Anterior Lower Leg: Stop Smoking Increase protein intake. Other: - Please try to keep blood sugars below 180 Negative Pressure Wound Therapy: Wound #1 Right,Proximal,Anterior Lower Leg: Other: - SNAP Vac continuous at 125 mmHg I have recommended: 1. care of the snap vacuum device has been discussed with them and we will see her back next week for a change 2. good control of her diabetes mellitus 3. adequate protein, vitamin C, vitamin A and zinc 4. regular review at the wound center. Electronic Signature(s) Signed: 10/11/2016 3:23:31 PM By: Evlyn Kanner MD, FACS Entered By: Evlyn Kanner on 10/11/2016 15:23:31 VANNAH, NADAL ([id number]) -------------------------------------------------------------------------------- SuperBill Details [name] Date of Service: 10/11/2016 Medical Record Number: [id number] Patient Account Number: [id number] Date of Birth/Sex: May 08, 1946 (70 y.o. Female) Treating RN: Curtis Sites Primary Care Provider: Milon Score Other Clinician: Referring Provider: Treating Provider/Extender: Rudene Re in Treatment: 4 Diagnosis Coding ICD-10 Codes Code Description E11.622 Type 2 diabetes mellitus with other skin ulcer M70.861 Other soft tissue disorders related to use, overuse  and pressure, right lower leg L97.212 Non-pressure chronic ulcer of right calf with fat layer exposed F17.218  Nicotine dependence, cigarettes, with other nicotine-induced disorders Facility Procedures CPT4: Description Modifier Quantity Code 16109604 11042 - DEB SUBQ TISSUE 20 SQ CM/< 1 ICD-10 Description Diagnosis E11.622 Type 2 diabetes mellitus with other skin ulcer M70.861 Other soft tissue disorders related to use, overuse and pressure, right  lower leg L97.212 Non-pressure chronic ulcer of right calf with fat layer exposed Physician Procedures CPT4: Description Modifier Quantity Code 5409811 11042 - WC PHYS SUBQ TISS 20 SQ CM 1 ICD-10 Description Diagnosis E11.622 Type 2 diabetes mellitus with other skin ulcer M70.861 Other soft tissue disorders related to use, overuse and pressure, right  lower leg L97.212 Non-pressure chronic ulcer of right calf with fat layer exposed Electronic Signature(s) Signed: 10/11/2016 3:23:43 PM By: Evlyn Kanner MD, FACS Entered By: Evlyn Kanner on 10/11/2016 15:23:43

## 2016-10-18 ENCOUNTER — Encounter: Payer: Medicare Other | Attending: Surgery | Admitting: Surgery

## 2016-10-18 DIAGNOSIS — I10 Essential (primary) hypertension: Secondary | ICD-10-CM | POA: Insufficient documentation

## 2016-10-18 DIAGNOSIS — Z7982 Long term (current) use of aspirin: Secondary | ICD-10-CM | POA: Diagnosis not present

## 2016-10-18 DIAGNOSIS — M545 Low back pain: Secondary | ICD-10-CM | POA: Diagnosis not present

## 2016-10-18 DIAGNOSIS — F17218 Nicotine dependence, cigarettes, with other nicotine-induced disorders: Secondary | ICD-10-CM | POA: Insufficient documentation

## 2016-10-18 DIAGNOSIS — Z881 Allergy status to other antibiotic agents status: Secondary | ICD-10-CM | POA: Diagnosis not present

## 2016-10-18 DIAGNOSIS — J449 Chronic obstructive pulmonary disease, unspecified: Secondary | ICD-10-CM | POA: Diagnosis not present

## 2016-10-18 DIAGNOSIS — L97212 Non-pressure chronic ulcer of right calf with fat layer exposed: Secondary | ICD-10-CM | POA: Insufficient documentation

## 2016-10-18 DIAGNOSIS — Z886 Allergy status to analgesic agent status: Secondary | ICD-10-CM | POA: Insufficient documentation

## 2016-10-18 DIAGNOSIS — G8929 Other chronic pain: Secondary | ICD-10-CM | POA: Insufficient documentation

## 2016-10-18 DIAGNOSIS — E039 Hypothyroidism, unspecified: Secondary | ICD-10-CM | POA: Insufficient documentation

## 2016-10-18 DIAGNOSIS — E11622 Type 2 diabetes mellitus with other skin ulcer: Secondary | ICD-10-CM | POA: Insufficient documentation

## 2016-10-18 DIAGNOSIS — Z79899 Other long term (current) drug therapy: Secondary | ICD-10-CM | POA: Diagnosis not present

## 2016-10-18 DIAGNOSIS — I471 Supraventricular tachycardia: Secondary | ICD-10-CM | POA: Insufficient documentation

## 2016-10-18 DIAGNOSIS — Z7984 Long term (current) use of oral hypoglycemic drugs: Secondary | ICD-10-CM | POA: Diagnosis not present

## 2016-10-18 DIAGNOSIS — M70861 Other soft tissue disorders related to use, overuse and pressure, right lower leg: Secondary | ICD-10-CM | POA: Insufficient documentation

## 2016-10-19 NOTE — Progress Notes (Signed)
[name], [name] ([id number]) Visit Report for 10/18/2016 Chief Complaint Document Details [name] Date of Service: 10/18/2016 1:30 PM Medical Record Number: [id number] Patient Account Number: [id number] Date of Birth/Sex: Dec 16, 1945 (70 y.o. Female) Treating RN: Huel Coventry Primary Care Provider: Milon Score Other Clinician: Referring Provider: Treating Provider/Extender: Rudene Re in Treatment: 5 Information Obtained from: Patient Chief Complaint Patients presents for treatment of an open diabetic ulcer due to a injury to her right lower extremity about 4 weeks Electronic Signature(s) Signed: 10/18/2016 2:03:03 PM By: Evlyn Kanner MD, FACS Entered By: Evlyn Kanner on 10/18/2016 14:03:03 [name] ([id number]) -------------------------------------------------------------------------------- Debridement Details [name] Date of Service: 10/18/2016 1:30 PM Medical Record Number: [id number] Patient Account Number: [id number] Date of Birth/Sex: 07-Jul-1946 (70 y.o. Female) Treating RN: Huel Coventry Primary Care Provider: Milon Score Other Clinician: Referring Provider: Treating Provider/Extender: Rudene Re in Treatment: 5 Debridement Performed for Wound #1 Right,Proximal,Anterior Lower Leg Assessment: Performed By: Physician Evlyn Kanner, MD Debridement: Debridement Pre-procedure Yes - 13:42 Verification/Time Out Taken: Start Time: 13:43 Pain Control: Other : Lidocaine 4% Level: Skin/Subcutaneous Tissue Total Area Debrided (L x 2.1 (cm) x 2 (cm) = 4.2 (cm) W): Tissue and other Viable, Non-Viable, Fibrin/Slough, Subcutaneous material debrided: Instrument: Curette Bleeding: Moderate Hemostasis Achieved: Pressure End Time: 14:46 Procedural Pain: 2 Post Procedural Pain: 0 Response to Treatment: Procedure was tolerated well Post Debridement Measurements of Total Wound Length: (cm) 2.1 Width: (cm) 2 Depth:  (cm) 0.2 Volume: (cm) 0.66 Character of Wound/Ulcer Post Requires Further Debridement Debridement: Muscle involvement without Severity of Tissue Post Debridement: necrosis Post Procedure Diagnosis Same as Pre-procedure Notes sharp debridement was done and subcutaneous tissue was removed along the 7 to 11:00 position and this leaves a fairly significant flap of undermining. #3 curet was used and bleeding controlled with pressure Electronic Signature(s) [name], [name] ([id number]) Signed: 10/18/2016 2:02:54 PM By: Evlyn Kanner MD, FACS Signed: 10/18/2016 4:17:07 PM By: Elliot Gurney RN, BSN, Kim RN, BSN Entered By: Evlyn Kanner on 10/18/2016 14:02:54 [name], [name] ([id number]) -------------------------------------------------------------------------------- HPI Details [name] Date of Service: 10/18/2016 1:30 PM Medical Record Number: [id number] Patient Account Number: [id number] Date of Birth/Sex: Aug 31, 1945 (71 y.o. Female) Treating RN: Huel Coventry Primary Care Provider: Milon Score Other Clinician: Referring Provider: Treating Provider/Extender: Rudene Re in Treatment: 5 History of Present Illness Location: right lower extremity in the area of the lateral calf below the knee Quality: Patient reports experiencing a sharp pain to affected area(s). Severity: Patient states wound are getting worse. Duration: Patient has had the wound for < 4 weeks prior to presenting for treatment Timing: Pain in wound is constant (hurts all the time) Context: The wound occurred when the patient a blunt injury and fall Modifying Factors: Other treatment(s) tried include:courses of doxycycline and local Bactroban ointment Associated Signs and Symptoms: Patient reports having increase discharge. HPI Description: 71 year old patient has been referred to as with right lower extremity wound with edema and ecchymosis, after having a injury due to a fall on 08/20/2016. she has a  history of diabetes mellitus type 2 which is uncomplicated, chronic back pain, COPD, hypertension, hypothyroidism, supraventricular tachycardia and tobacco abuse. He is status post neck surgery, appendectomy, cholecystectomy, hernia repair. He smokes about half a pack of cigarettes a day for the last 50 years. Recently on doxycycline for 10 days and was given symptomatic treatment for wound. 10/04/2016 -- the patient tolerated the snap vacuum system well and  had no significant problems. She continues to smoke and is trying to give it up. 10/11/2016 -- she continues to tolerate the snap like him system fairly well and other than that she has no fresh issues Electronic Signature(s) Signed: 10/18/2016 2:03:14 PM By: Evlyn Kanner MD, FACS Entered By: Evlyn Kanner on 10/18/2016 14:03:14 [name] ([id number]) -------------------------------------------------------------------------------- Physical Exam Details [name] Date of Service: 10/18/2016 1:30 PM Medical Record Number: [id number] Patient Account Number: [id number] Date of Birth/Sex: [birth year] (70 y.o. Female) Treating RN: Huel Coventry Primary Care Provider: Milon Score Other Clinician: Referring Provider: Treating Provider/Extender: Rudene Re in Treatment: 5 Constitutional . Pulse regular. Respirations normal and unlabored. Afebrile. . Eyes Nonicteric. Reactive to light. Ears, Nose, Mouth, and Throat Lips, teeth, and gums WNL.Marland Kitchen Moist mucosa without lesions. Neck supple and nontender. No palpable supraclavicular or cervical adenopathy. Normal sized without goiter. Respiratory WNL. No retractions.. Breath sounds WNL, No rubs, rales, rhonchi, or wheeze.. Cardiovascular Heart rhythm and rate regular, no murmur or gallop.. Pedal Pulses WNL. No clubbing, cyanosis or edema. Chest Breasts symmetical and no nipple discharge.. Breast tissue WNL, no masses, lumps, or tenderness.. Lymphatic No  adneopathy. No adenopathy. No adenopathy. Musculoskeletal Adexa without tenderness or enlargement.. Digits and nails w/o clubbing, cyanosis, infection, petechiae, ischemia, or inflammatory conditions.. Integumentary (Hair, Skin) No suspicious lesions. No crepitus or fluctuance. No peri-wound warmth or erythema. No masses.Marland Kitchen Psychiatric Judgement and insight Intact.. No evidence of depression, anxiety, or agitation.. Notes she again required subcutaneous debridement to the 7 and 11:00 position and once this was done the undermining is approximately a centimeter. Electronic Signature(s) Signed: 10/18/2016 2:03:42 PM By: Evlyn Kanner MD, FACS Entered By: Evlyn Kanner on 10/18/2016 14:03:41 [name], [name] ([id number]) -------------------------------------------------------------------------------- Physician Orders Details [name] Date of Service: 10/18/2016 1:30 PM Medical Record Number: [id number] Patient Account Number: [id number] Date of Birth/Sex: Aug 03, 1945 (70 y.o. Female) Treating RN: Huel Coventry Primary Care Provider: Milon Score Other Clinician: Referring Provider: Treating Provider/Extender: Rudene Re in Treatment: 5 Verbal / Phone Orders: No Diagnosis Coding Wound Cleansing Wound #1 Right,Proximal,Anterior Lower Leg o Clean wound with Normal Saline. o May Shower, gently pat wound dry prior to applying new dressing. Anesthetic Wound #1 Right,Proximal,Anterior Lower Leg o Topical Lidocaine 4% cream applied to wound bed prior to debridement Skin Barriers/Peri-Wound Care Wound #1 Right,Proximal,Anterior Lower Leg o Skin Prep Dressing Change Frequency Wound #1 Right,Proximal,Anterior Lower Leg o Change dressing every week Follow-up Appointments Wound #1 Right,Proximal,Anterior Lower Leg o Return Appointment in 1 week. o Nurse Visit as needed Edema Control Wound #1 Right,Proximal,Anterior Lower Leg o Elevate legs to the  level of the heart and pump ankles as often as possible Additional Orders / Instructions Wound #1 Right,Proximal,Anterior Lower Leg o Stop Smoking o Increase protein intake. o Other: - Please try to keep blood sugars below 180 Negative Pressure Wound Therapy Wound #1 Right,Proximal,Anterior Lower Leg o Other: - SNAP Vac continuous at 125 mmHg [name], [name] ([id number]) Electronic Signature(s) Signed: 10/18/2016 4:17:07 PM By: Elliot Gurney RN, BSN, Kim RN, BSN Signed: 10/18/2016 4:19:22 PM By: Evlyn Kanner MD, FACS Entered By: Elliot Gurney RN, BSN, Kim on 10/18/2016 13:59:55 [name], [name] ([id number]) -------------------------------------------------------------------------------- Problem List Details [name] Date of Service: 10/18/2016 1:30 PM Medical Record Number: [id number] Patient Account Number: [id number] Date of Birth/Sex: Jun 27, 1946 (71 y.o. 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 Female) Treating RN: Huel Coventry Primary Care Provider: Milon Score Other Clinician: Referring Provider: Treating Provider/Extender: Rudene Re in Treatment: 5 Subjective Chief Complaint Information obtained from Patient Patients presents for treatment of an open diabetic ulcer due to a injury to her right lower extremity about 4 weeks History of Present Illness (HPI) The following HPI elements were documented for the patient's wound: Location: right lower extremity in the area of the lateral calf below the knee Quality: Patient reports experiencing a sharp pain to affected area(s). Severity: Patient states wound are getting worse. Duration: Patient has had the wound for < 4 weeks prior to presenting for treatment Timing: Pain in wound is constant (hurts all the time) Context: The wound occurred when the patient a blunt injury and fall Modifying Factors: Other treatment(s) tried include:courses of doxycycline and local Bactroban ointment Associated Signs and Symptoms: Patient reports having increase discharge. 71 year old patient has been referred to as with right lower extremity wound with edema and ecchymosis, after having a injury due to a fall on 08/20/2016. she has a history of diabetes mellitus type 2 which is uncomplicated, chronic back pain, COPD, hypertension, hypothyroidism, supraventricular tachycardia and tobacco abuse. He is status post neck surgery, appendectomy, cholecystectomy, hernia repair. He smokes about half a pack of cigarettes a day for the last 50 years. Recently on doxycycline for 10 days and was given symptomatic treatment for wound. 10/04/2016 -- the patient tolerated the snap vacuum system well and had no significant problems. She continues to smoke and is trying to give it up. 10/11/2016 -- she continues to tolerate the snap like him system fairly well and other than that she has no fresh  issues Objective Constitutional Pulse regular. Respirations normal and unlabored. Afebrile. [name], [name] ([id number]) Vitals Time Taken: 1:30 PM, Height: 67 in, Weight: 197 lbs, BMI: 30.9, Temperature: 97.7 F, Pulse: 91 bpm, Respiratory Rate: 16 breaths/min, Blood Pressure: 145/88 mmHg. Eyes Nonicteric. Reactive to light. Ears, Nose, Mouth, and Throat Lips, teeth, and gums WNL.Marland Kitchen Moist mucosa without lesions. Neck supple and nontender. No palpable supraclavicular or cervical adenopathy. Normal sized without goiter. Respiratory WNL. No retractions.. Breath sounds WNL, No rubs, rales, rhonchi, or wheeze.. Cardiovascular Heart rhythm and rate regular, no murmur or gallop.. Pedal Pulses WNL. No clubbing, cyanosis or edema. Chest Breasts symmetical and no nipple discharge.. Breast tissue WNL, no masses, lumps, or tenderness.. Lymphatic No adneopathy. No adenopathy. No adenopathy. Musculoskeletal Adexa without tenderness or enlargement.. Digits and nails w/o clubbing, cyanosis, infection, petechiae, ischemia, or inflammatory conditions.Marland Kitchen Psychiatric Judgement and insight Intact.. No evidence of depression, anxiety, or agitation.. General Notes: she again required subcutaneous debridement to the 7 and 11:00 position and once this was done the undermining is approximately a centimeter. Integumentary (Hair, Skin) No suspicious lesions. No crepitus or fluctuance. No  peri-wound warmth or erythema. No masses.. Wound #1 status is Open. Original cause of wound was Trauma. The wound is located on the Right,Proximal,Anterior Lower Leg. The wound measures 2.1cm length x 2cm width x 0.2cm depth; 3.299cm^2 area and 0.66cm^3 volume. There is Fat Layer (Subcutaneous Tissue) Exposed exposed. There is no tunneling noted, however, there is undermining starting at 6:00 and ending at 11:00 with a maximum distance of 0.5cm. There is a large amount of serous drainage noted. The wound margin is flat and  intact. There is large (67-100%) red granulation within the wound bed. There is a small (1-33%) amount of necrotic tissue within the wound bed including Adherent Slough. The periwound skin appearance exhibited: Scarring, Maceration, Ecchymosis. The periwound skin appearance did not exhibit: Callus, Crepitus, Excoriation, Induration, Rash, Dry/Scaly, Atrophie Blanche, Cyanosis, Hemosiderin Staining, Mottled, Pallor, Rubor, Erythema. Periwound temperature was noted as No Abnormality. The periwound has tenderness on palpation. 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 There was a Skin/Subcutaneous Tissue Debridement ([id number]) debridement with total area of 4.2 sq cm performed by Evlyn Kanner, MD. with the following instrument(s): Curette to remove Viable and Non-Viable tissue/material including Fibrin/Slough and Subcutaneous after achieving pain control using Other (Lidocaine 4%). A time out was conducted at 13:42, prior to the start of the procedure. A Moderate amount of bleeding was controlled with Pressure. The procedure was tolerated well with a pain level of 2 throughout and a pain level of 0 following the procedure. Post Debridement Measurements: 2.1cm length x 2cm width x 0.2cm depth; 0.66cm^3 volume. Character of Wound/Ulcer Post Debridement requires further debridement. Severity of Tissue Post Debridement is: Muscle involvement without necrosis. Post procedure Diagnosis Wound #1: Same as Pre-Procedure General Notes: sharp debridement was done and subcutaneous  tissue was removed along the 7 to 11:00 position and this leaves a fairly significant flap of undermining. #3 curet was used and bleeding controlled with pressure. Plan Wound Cleansing: Wound #1 Right,Proximal,Anterior Lower Leg: Clean wound with Normal Saline. May Shower, gently pat wound dry prior to applying new dressing. Anesthetic: Wound #1 Right,Proximal,Anterior Lower Leg: Topical Lidocaine 4% cream applied to wound bed prior to debridement Skin Barriers/Peri-Wound Care: [name], [name] ([id number]) Wound #1 Right,Proximal,Anterior Lower Leg: Skin Prep Dressing Change Frequency: Wound #1 Right,Proximal,Anterior Lower Leg: Change dressing every week Follow-up Appointments: Wound #1 Right,Proximal,Anterior Lower Leg: Return Appointment in 1 week. Nurse Visit as needed Edema Control: Wound #1 Right,Proximal,Anterior Lower Leg: Elevate legs to the level of the heart and pump ankles as often as possible Additional Orders / Instructions: Wound #1 Right,Proximal,Anterior Lower Leg: Stop Smoking Increase protein intake. Other: - Please try to keep blood sugars below 180 Negative Pressure Wound Therapy: Wound #1 Right,Proximal,Anterior Lower Leg: Other: - SNAP Vac continuous at 125 mmHg I have recommended: 1. care of the snap vacuum device has been discussed with them and we will see her back next week for a change 2. good control of her diabetes mellitus 3. adequate protein, vitamin C, vitamin A and zinc 4. regular review at the wound center. Electronic Signature(s) Signed: 10/18/2016 2:04:11 PM By: Evlyn Kanner MD, FACS Entered By: Evlyn Kanner  on 10/18/2016 14:04:11 KELEIGH, KAZEE ([id number]) -------------------------------------------------------------------------------- SuperBill Details [name] Date of Service: 10/18/2016 Medical Record Number: [id number] Patient Account Number: [id number] Date of Birth/Sex: November 03, 1945 (70 y.o.  Female) Treating RN: Huel Coventry Primary Care Provider: Milon Score Other Clinician: Referring Provider: Treating Provider/Extender: Rudene Re in Treatment: 5 Diagnosis Coding ICD-10 Codes Code Description E11.622 Type 2 diabetes mellitus with other skin ulcer M70.861 Other soft tissue disorders related to use, overuse and pressure, right lower leg L97.212 Non-pressure chronic ulcer of right calf with fat layer exposed F17.218 Nicotine dependence, cigarettes, with other nicotine-induced disorders Facility Procedures CPT4: Description Modifier Quantity Code [id number] 11042 - DEB SUBQ TISSUE 20 SQ CM/< 1 ICD-10 Description Diagnosis E11.622 Type 2 diabetes mellitus with other skin ulcer M70.861 Other soft tissue disorders related to use, overuse and pressure, right  lower leg L97.212 Non-pressure chronic ulcer of right calf with fat layer exposed Physician Procedures CPT4: Description Modifier Quantity Code [id number] 11042 - WC PHYS SUBQ TISS 20 SQ CM 1 ICD-10 Description Diagnosis E11.622 Type 2 diabetes mellitus with other skin ulcer M70.861 Other soft tissue disorders related to use, overuse and pressure, right  lower leg L97.212 Non-pressure chronic ulcer of right calf with fat layer exposed Electronic Signature(s) Signed: 10/18/2016 2:04:21 PM By: Evlyn Kanner MD, FACS Entered By: Evlyn Kanner on 10/18/2016 14:04:21

## 2016-10-19 NOTE — Progress Notes (Signed)
SHASTA, CHINN ([id number]) Visit Report for 10/18/2016 Arrival Information Details [name], [name] Date of Service: 10/18/2016 1:30 PM Medical Record Number: [id number] Patient Account Number: [id number] Date of Birth/Sex: July 22, 1945 (70 y.o. Female) Treating RN: Huel Coventry Primary Care Tylee Yum: Milon Score Other Clinician: Referring Adreena Willits: Treating Darren Caldron/Extender: Rudene Re in Treatment: 5 Visit Information History Since Last Visit Added or deleted any medications: No Patient Arrived: Ambulatory Any new allergies or adverse reactions: No Arrival Time: 13:29 Had a fall or experienced change in No Accompanied By: Elfredia Nevins activities of daily living that may affect Transfer Assistance: Manual risk of falls: Patient Identification Verified: Yes Signs or symptoms of abuse/neglect since last No Secondary Verification Process Yes visito Completed: Hospitalized since last visit: No Patient Has Alerts: Yes Has Dressing in Place as Prescribed: Yes Patient Alerts: DMII Pain Present Now: No Electronic Signature(s) Signed: 10/18/2016 4:17:07 PM By: Elliot Gurney, RN, BSN, Kim RN, BSN Entered By: Elliot Gurney, RN, BSN, Kim on 10/18/2016 13:30:15 [name] ([id number]) -------------------------------------------------------------------------------- Encounter Discharge Information Details [name] Date of Service: 10/18/2016 1:30 PM Medical Record Number: [id number] Patient Account Number: [id number] Date of Birth/Sex: [birth year] (70 y.o. Female) Treating RN: Huel Coventry Primary Care Magon Croson: Milon Score Other Clinician: Referring Bannie Lobban: Treating Ruthy Forry/Extender: Rudene Re in Treatment: 5 Encounter Discharge Information Items Discharge Pain Level: 0 Discharge Condition: Stable Ambulatory Status: Ambulatory Discharge Destination: Home Transportation: Private Auto Accompanied By: self Schedule Follow-up Appointment:  Yes Medication Reconciliation completed and provided to Patient/Care Yes Brett Soza: Provided on Clinical Summary of Care: 10/18/2016 Form Type Recipient Paper Patient ML Electronic Signature(s) Signed: 10/18/2016 4:17:07 PM By: Elliot Gurney RN, BSN, Kim RN, BSN Previous Signature: 10/18/2016 2:01:58 PM Version By: Gwenlyn Perking Entered By: Elliot Gurney RN, BSN, Kim on 10/18/2016 14:19:39 [name] ([id number]) -------------------------------------------------------------------------------- Lower Extremity Assessment Details [name] Date of Service: 10/18/2016 1:30 PM Medical Record Number: [id number] Patient Account Number: [id number] Date of Birth/Sex: 16-Oct-1945 (70 y.o. Female) Treating RN: Huel Coventry Primary Care Ronney Honeywell: Milon Score Other Clinician: Referring Parrish Bonn: Treating Twylla Arceneaux/Extender: Rudene Re in Treatment: 5 Vascular Assessment Pulses: Dorsalis Pedis Palpable: [Right:Yes] Posterior Tibial Palpable: [Right:Yes] Extremity colors, hair growth, and conditions: Extremity Color: [Right:Normal] Hair Growth on Extremity: [Right:Yes] Temperature of Extremity: [Right:Warm] Capillary Refill: [Right:< 3 seconds] Dependent Rubor: [Right:No] Blanched when Elevated: [Right:No] Toe Nail Assessment Left: Right: Thick: No Discolored: No Deformed: No Improper Length and Hygiene: No Electronic Signature(s) Signed: 10/18/2016 4:17:07 PM By: Elliot Gurney, RN, BSN, Kim RN, BSN Entered By: Elliot Gurney, RN, BSN, Kim on 10/18/2016 13:38:42 [name], [name] ([id number]) -------------------------------------------------------------------------------- Multi Wound Chart Details [name] Date of Service: 10/18/2016 1:30 PM Medical Record Number: [id number] Patient Account Number: [id number] Date of Birth/Sex: Nov 26, 1945 (70 y.o. Female) Treating RN: Huel Coventry Primary Care Sasha Rogel: Milon Score Other Clinician: Referring Kyrielle Urbanski: Treating  Yoanna Jurczyk/Extender: Rudene Re in Treatment: 5 Vital Signs Height(in): 67 Pulse(bpm): 91 Weight(lbs): 197 Blood Pressure 145/88 (mmHg): Body Mass Index(BMI): 31 Temperature(F): 97.7 Respiratory Rate 16 (breaths/min): Photos: [N/A:N/A] Wound Location: Right Lower Leg - N/A N/A Anterior, Proximal Wounding Event: Trauma N/A N/A Primary Etiology: Trauma, Other N/A N/A Comorbid History: Chronic Obstructive N/A N/A Pulmonary Disease (COPD), Hypertension, Type II Diabetes Date Acquired: 08/17/2016 N/A N/A Weeks of Treatment: 5 N/A N/A Wound Status: Open N/A N/A Measurements L x W x D 2.1x2x0.2 N/A N/A (cm) Area (cm) : 3.299 N/A N/A Volume (cm) : 0.66 N/A N/A % Reduction in Area:  9.70% N/A N/A % Reduction in Volume: -80.80% N/A N/A Starting Position 1 6 (o'clock): Ending Position 1 11 (o'clock): Maximum Distance 1 0.5 (cm): Undermining: Yes N/A N/A Classification: N/A N/A [name], [name] ([id number]) Full Thickness Without Exposed Support Structures HBO Classification: Grade 1 N/A N/A Exudate Amount: Large N/A N/A Exudate Type: Serous N/A N/A Exudate Color: amber N/A N/A Wound Margin: Flat and Intact N/A N/A Granulation Amount: Large (67-100%) N/A N/A Granulation Quality: Red N/A N/A Necrotic Amount: Small (1-33%) N/A N/A Exposed Structures: Fat Layer (Subcutaneous N/A N/A Tissue) Exposed: Yes Fascia: No Tendon: No Muscle: No Joint: No Bone: No Epithelialization: Small (1-33%) N/A N/A Debridement: Debridement (40981- N/A N/A 11047) Pre-procedure 13:42 N/A N/A Verification/Time Out Taken: Pain Control: Other N/A N/A Tissue Debrided: Fibrin/Slough, N/A N/A Subcutaneous Level: Skin/Subcutaneous N/A N/A Tissue Debridement Area (sq 4.2 N/A N/A cm): Instrument: Curette N/A N/A Bleeding: Moderate N/A N/A Hemostasis Achieved: Pressure N/A N/A Procedural Pain: 2 N/A N/A Post Procedural Pain: 0 N/A N/A Debridement Treatment Procedure was tolerated  N/A N/A Response: well Post Debridement 2.1x2x0.2 N/A N/A Measurements L x W x D (cm) Post Debridement 0.66 N/A N/A Volume: (cm) Periwound Skin Texture: Scarring: Yes N/A N/A Excoriation: No Induration: No Callus: No Crepitus: No Rash: No [name], [name] ([id number]) Periwound Skin Maceration: Yes N/A N/A Moisture: Dry/Scaly: No Periwound Skin Color: Ecchymosis: Yes N/A N/A Atrophie Blanche: No Cyanosis: No Erythema: No Hemosiderin Staining: No Mottled: No Pallor: No Rubor: No Temperature: No Abnormality N/A N/A Tenderness on Yes N/A N/A Palpation: Wound Preparation: Ulcer Cleansing: N/A N/A Rinsed/Irrigated with Saline Topical Anesthetic Applied: Other: lidocaine 4% Procedures Performed: Debridement N/A N/A Treatment Notes Electronic Signature(s) Signed: 10/18/2016 2:02:13 PM By: Evlyn Kanner MD, FACS Entered By: Evlyn Kanner on 10/18/2016 14:02:13 [name], [name] ([id number]) -------------------------------------------------------------------------------- Multi-Disciplinary Care Plan Details [name] Date of Service: 10/18/2016 1:30 PM Medical Record Number: [id number] Patient Account Number: [id number] Date of Birth/Sex: Jun 24, 1946 (70 y.o. Female) Treating RN: Huel Coventry Primary Care Ginnette Gates: Milon Score Other Clinician: Referring Esta Carmon: Treating Goddess Gebbia/Extender: Rudene Re in Treatment: 5 Active Inactive ` Necrotic Tissue Nursing Diagnoses: Impaired tissue integrity related to necrotic/devitalized tissue Goals: Necrotic/devitalized tissue will be minimized in the wound bed Date Initiated: 09/13/2016 Target Resolution Date: 10/18/2016 Goal Status: Active Interventions: Assess patient pain level pre-, during and post procedure and prior to discharge Notes: ` Orientation to the Wound Care Program Nursing Diagnoses: Knowledge deficit related to the wound healing center program Goals: Patient/caregiver will  verbalize understanding of the Wound Healing Center Program Date Initiated: 09/13/2016 Target Resolution Date: 12/20/2016 Goal Status: Active Interventions: Provide education on orientation to the wound center Notes: ` Wound/Skin Impairment Nursing Diagnoses: Impaired tissue integrity [name], [name] ([id number]) Goals: Patient/caregiver will verbalize understanding of skin care regimen Date Initiated: 09/13/2016 Target Resolution Date: 12/20/2016 Goal Status: Active Ulcer/skin breakdown will have a volume reduction of 30% by week 4 Date Initiated: 09/13/2016 Target Resolution Date: 12/20/2016 Goal Status: Active Ulcer/skin breakdown will have a volume reduction of 50% by week 8 Date Initiated: 09/13/2016 Target Resolution Date: 12/20/2016 Goal Status: Active Ulcer/skin breakdown will have a volume reduction of 80% by week 12 Date Initiated: 09/13/2016 Target Resolution Date: 12/20/2016 Goal Status: Active Ulcer/skin breakdown will heal within 14 weeks Date Initiated: 09/13/2016 Target Resolution Date: 12/20/2016 Goal Status: Active Interventions: Assess patient/caregiver ability to obtain necessary supplies Assess patient/caregiver ability to perform ulcer/skin care regimen upon admission and as needed Assess ulceration(s) every visit Notes:  Electronic Signature(s) Signed: 10/18/2016 4:17:07 PM By: Elliot Gurney, RN, BSN, Kim RN, BSN Entered By: Elliot Gurney, RN, BSN, Kim on 10/18/2016 13:44:55 [name], [name] ([id number]) -------------------------------------------------------------------------------- Negative Pressure Wound Therapy Application (NPWT) Details [name] Date of Service: 10/18/2016 1:30 PM Medical Record Number: [id number] Patient Account Number: [id number] Date of Birth/Sex: 1945-07-18 (70 y.o. Female) Treating RN: Huel Coventry Primary Care Kammie Scioli: Milon Score Other Clinician: Referring Zeddie Njie: Treating Fletcher Rathbun/Extender: Rudene Re in Treatment: 5 NPWT  Application Performed for: Wound #1 Right,Proximal,Anterior Lower Leg Performed By: Huel Coventry, RN Type: Other Coverage Size (sq cm): 4.2 Pressure Type: Constant Pressure Setting: 125 mmHG Drain Type: None Primary Contact: Other Other: hydrocolloid Quantity of Sponges/Gauze Inserted: 1 Date Initiated: 10/18/2016 Response to Treatment: Blue foam tucked into undermining from 7-11pm. Patient tolerated well Post Procedure Diagnosis Same as Pre-procedure Electronic Signature(s) Signed: 10/18/2016 4:17:07 PM By: Elliot Gurney, RN, BSN, Kim RN, BSN Entered By: Elliot Gurney, RN, BSN, Kim on 10/18/2016 14:19:00 [name] ([id number]) -------------------------------------------------------------------------------- Pain Assessment Details [name] Date of Service: 10/18/2016 1:30 PM Medical Record Number: [id number] Patient Account Number: [id number] Date of Birth/Sex: Jan 28, 1946 (70 y.o. Female) Treating RN: Huel Coventry Primary Care Raeli Wiens: Milon Score Other Clinician: Referring Sabrina Keough: Treating Laporshia Hogen/Extender: Rudene Re in Treatment: 5 Active Problems Location of Pain Severity and Description of Pain Patient Has Paino No Site Locations With Dressing Change: No Pain Management and Medication Current Pain Management: Notes Topical or injectable lidocaine is offered to patient for acute pain when surgical debridement is performed. If needed, Patient is instructed to use over the counter pain medication for the following 24-48 hours after debridement. Wound care MDs do not prescribed pain medications. Patient has chronic pain or uncontrolled pain. Patient has been instructed to make an appointment with their Primary Care Physician for pain management. Electronic Signature(s) Signed: 10/18/2016 4:17:07 PM By: Elliot Gurney, RN, BSN, Kim RN, BSN Entered By: Elliot Gurney, RN, BSN, Kim on 10/18/2016 13:30:30 [name], [name]  ([id number]) -------------------------------------------------------------------------------- Patient/Caregiver Education Details [name] Date of Service: 10/18/2016 1:30 PM Medical Record Number: [id number] Patient Account Number: [id number] Date of Birth/Gender: Aug 08, 1945 (70 y.o. Female) Treating RN: Huel Coventry Primary Care Physician: Milon Score Other Clinician: Referring Physician: Treating Physician/Extender: Rudene Re in Treatment: 5 Education Assessment Education Provided To: Patient Education Topics Provided Wound/Skin Impairment: Handouts: Caring for Your Ulcer, Other: Snap Vac care Methods: Demonstration, Explain/Verbal Responses: State content correctly Electronic Signature(s) Signed: 10/18/2016 4:17:07 PM By: Elliot Gurney, RN, BSN, Kim RN, BSN Entered By: Elliot Gurney, RN, BSN, Kim on 10/18/2016 14:20:04 [name] ([id number]) -------------------------------------------------------------------------------- Wound Assessment Details [name] Date of Service: 10/18/2016 1:30 PM Medical Record Number: [id number] Patient Account Number: [id number] Date of Birth/Sex: 01-11-1946 (70 y.o. Female) Treating RN: Huel Coventry Primary Care Valjean Ruppel: Milon Score Other Clinician: Referring Jamile Sivils: Treating Tallin Hart/Extender: Rudene Re in Treatment: 5 Wound Status Wound Number: 1 Primary Trauma, Other Etiology: Wound Location: Right Lower Leg - Anterior, Proximal Wound Open Status: Wounding Event: Trauma Comorbid Chronic Obstructive Pulmonary Date Acquired: 08/17/2016 History: Disease (COPD), Hypertension, Type Weeks Of Treatment: 5 II Diabetes Clustered Wound: No Photos Wound Measurements Length: (cm) 2.1 Width: (cm) 2 Depth: (cm) 0.2 Area: (cm) 3.299 Volume: (cm) 0.66 % Reduction in Area: 9.7% % Reduction in Volume: -80.8% Epithelialization: Small (1-33%) Tunneling: No Undermining: Yes Starting Position  (o'clock): 6 Ending Position (o'clock): 11 Maximum Distance: (cm) 0.5 Wound Description Full Thickness Without Classification: Exposed Support Structures Diabetic  Severity Grade 1 (Wagner): Wound Margin: Flat and Intact Exudate Amount: Large Exudate Type: Serous Exudate Color: amber Foul Odor After Cleansing: No Slough/Fibrino No Wound Bed [name], [name] ([id number]) Granulation Amount: Large (67-100%) Exposed Structure Granulation Quality: Red Fascia Exposed: No Necrotic Amount: Small (1-33%) Fat Layer (Subcutaneous Tissue) Exposed: Yes Necrotic Quality: Adherent Slough Tendon Exposed: No Muscle Exposed: No Joint Exposed: No Bone Exposed: No Periwound Skin Texture Texture Color No Abnormalities Noted: No No Abnormalities Noted: No Callus: No Atrophie Blanche: No Crepitus: No Cyanosis: No Excoriation: No Ecchymosis: Yes Induration: No Erythema: No Rash: No Hemosiderin Staining: No Scarring: Yes Mottled: No Pallor: No Moisture Rubor: No No Abnormalities Noted: No Dry / Scaly: No Temperature / Pain Maceration: Yes Temperature: No Abnormality Tenderness on Palpation: Yes Wound Preparation Ulcer Cleansing: Rinsed/Irrigated with Saline Topical Anesthetic Applied: Other: lidocaine 4%, Treatment Notes Wound #1 (Right, Proximal, Anterior Lower Leg) Notes snap vac applied today Electronic Signature(s) Signed: 10/18/2016 4:17:07 PM By: Elliot Gurney, RN, BSN, Kim RN, BSN Entered By: Elliot Gurney, RN, BSN, Kim on 10/18/2016 13:47:08 [name], [name] ([id number]) -------------------------------------------------------------------------------- Vitals Details [name] Date of Service: 10/18/2016 1:30 PM Medical Record Number: [id number] Patient Account Number: [id number] Date of Birth/Sex: 1945/10/14 (70 y.o. Female) Treating RN: Huel Coventry Primary Care Maday Guarino: Milon Score Other Clinician: Referring Dekari Bures: Treating Shatira Dobosz/Extender: Rudene Re in Treatment: 5 Vital Signs Time Taken: 13:30 Temperature (F): 97.7 Height (in): 67 Pulse (bpm): 91 Weight (lbs): 197 Respiratory Rate (breaths/min): 16 Body Mass Index (BMI): 30.9 Blood Pressure (mmHg): 145/88 Reference Range: 80 - 120 mg / dl Electronic Signature(s) Signed: 10/18/2016 4:17:07 PM By: Elliot Gurney, RN, BSN, Kim RN, BSN Entered By: Elliot Gurney, RN, BSN, Kim on 10/18/2016 13:30:48

## 2016-10-25 ENCOUNTER — Encounter: Payer: Medicare Other | Admitting: Surgery

## 2016-10-25 DIAGNOSIS — E11622 Type 2 diabetes mellitus with other skin ulcer: Secondary | ICD-10-CM | POA: Diagnosis not present

## 2016-10-27 NOTE — Progress Notes (Signed)
CARLISE, STOFER ([id number]) Visit Report for 10/25/2016 Arrival Information Details [name], [name] Date of Service: 10/25/2016 1:30 PM Medical Record Number: [id number] Patient Account Number: [id number] Date of Birth/Sex: [birth year] (70 y.o. Female) Treating RN: Huel Coventry Primary Care Willmar Stockinger: Milon Score Other Clinician: Referring Traveon Louro: Treating Kaleb Sek/Extender: Rudene Re in Treatment: 6 Visit Information History Since Last Visit Added or deleted any medications: No Patient Arrived: Ambulatory Any new allergies or adverse reactions: No Arrival Time: 13:29 Had a fall or experienced change in No Accompanied By: self activities of daily living that may affect Transfer Assistance: None risk of falls: Patient Identification Verified: Yes Signs or symptoms of abuse/neglect since last No Secondary Verification Process Yes visito Completed: Hospitalized since last visit: No Patient Has Alerts: Yes Has Dressing in Place as Prescribed: Yes Patient Alerts: DMII Pain Present Now: No Electronic Signature(s) Signed: 10/25/2016 4:36:54 PM By: Elliot Gurney, RN, BSN, Kim RN, BSN Entered By: Elliot Gurney, RN, BSN, Kim on 10/25/2016 13:29:41 LILLYAN, HITSON ([id number]) -------------------------------------------------------------------------------- Encounter Discharge Information Details [name] Date of Service: 10/25/2016 1:30 PM Medical Record Number: [id number] Patient Account Number: [id number] Date of Birth/Sex: 07-Oct-1945 (70 y.o. Female) Treating RN: Huel Coventry Primary Care Markeya Mincy: Milon Score Other Clinician: Referring Nana Hoselton: Treating Kerolos Nehme/Extender: Rudene Re in Treatment: 6 Encounter Discharge Information Items Discharge Pain Level: 0 Discharge Condition: Stable Ambulatory Status: Ambulatory Discharge Destination: Home Transportation: Private Auto Accompanied By: self Schedule Follow-up Appointment:  Yes Medication Reconciliation completed and provided to Patient/Care Yes Anushree Dorsi: Provided on Clinical Summary of Care: 10/25/2016 Form Type Recipient Paper Patient ML Electronic Signature(s) Signed: 10/25/2016 1:59:36 PM By: Gwenlyn Perking Entered By: Gwenlyn Perking on 10/25/2016 13:59:35 [name] ([id number]) -------------------------------------------------------------------------------- Lower Extremity Assessment Details [name] Date of Service: 10/25/2016 1:30 PM Medical Record Number: [id number] Patient Account Number: [id number] Date of Birth/Sex: [birth year] (70 y.o. Female) Treating RN: Huel Coventry Primary Care Bartlett Enke: Milon Score Other Clinician: Referring Candelaria Pies: Treating Jariana Shumard/Extender: Rudene Re in Treatment: 6 Edema Assessment Assessed: [Left: No] [Right: No] Edema: [Left: N] [Right: o] Vascular Assessment Pulses: Dorsalis Pedis Palpable: [Right:Yes] Posterior Tibial Palpable: [Right:Yes] Extremity colors, hair growth, and conditions: Extremity Color: [Right:Normal] Hair Growth on Extremity: [Right:Yes] Temperature of Extremity: [Right:Warm] Capillary Refill: [Right:< 3 seconds] Toe Nail Assessment Left: Right: Thick: No Discolored: No Deformed: No Improper Length and Hygiene: No Electronic Signature(s) Signed: 10/25/2016 4:36:54 PM By: Elliot Gurney, RN, BSN, Kim RN, BSN Entered By: Elliot Gurney, RN, BSN, Kim on 10/25/2016 13:34:54 [name], [name] ([id number]) -------------------------------------------------------------------------------- Multi Wound Chart Details [name] Date of Service: 10/25/2016 1:30 PM Medical Record Number: [id number] Patient Account Number: [id number] Date of Birth/Sex: 04/05/1946 (70 y.o. Female) Treating RN: Huel Coventry Primary Care Amir Glaus: Milon Score Other Clinician: Referring Olander Friedl: Treating Lasheika Ortloff/Extender: Rudene Re in Treatment: 6 Vital  Signs Height(in): 67 Pulse(bpm): 68 Weight(lbs): 197 Blood Pressure 121/58 (mmHg): Body Mass Index(BMI): 31 Temperature(F): 97.9 Respiratory Rate 16 (breaths/min): Photos: [N/A:N/A] Wound Location: Right Lower Leg - N/A N/A Anterior, Proximal Wounding Event: Trauma N/A N/A Primary Etiology: Trauma, Other N/A N/A Comorbid History: Chronic Obstructive N/A N/A Pulmonary Disease (COPD), Hypertension, Type II Diabetes Date Acquired: 08/17/2016 N/A N/A Weeks of Treatment: 6 N/A N/A Wound Status: Open N/A N/A Measurements L x W x D 1.9x1.8x0.2 N/A N/A (cm) Area (cm) : 2.686 N/A N/A Volume (cm) : 0.537 N/A N/A % Reduction in Area: 26.50% N/A N/A % Reduction in Volume: -47.10% N/A N/A Starting  Position 1 7 (o'clock): Ending Position 1 12 (o'clock): Maximum Distance 1 1.4 (cm): Undermining: Yes N/A N/A Classification: N/A N/A [name], [name] ([id number]) Full Thickness Without Exposed Support Structures HBO Classification: Grade 1 N/A N/A Exudate Amount: Large N/A N/A Exudate Type: Serous N/A N/A Exudate Color: amber N/A N/A Wound Margin: Flat and Intact N/A N/A Granulation Amount: Large (67-100%) N/A N/A Granulation Quality: Red N/A N/A Necrotic Amount: Small (1-33%) N/A N/A Exposed Structures: Fat Layer (Subcutaneous N/A N/A Tissue) Exposed: Yes Fascia: No Tendon: No Muscle: No Joint: No Bone: No Epithelialization: Small (1-33%) N/A N/A Debridement: Debridement (30865- N/A N/A 11047) Pre-procedure 13:44 N/A N/A Verification/Time Out Taken: Pain Control: Other N/A N/A Tissue Debrided: Fibrin/Slough, N/A N/A Subcutaneous Level: Skin/Subcutaneous N/A N/A Tissue Debridement Area (sq 3.42 N/A N/A cm): Instrument: Curette N/A N/A Bleeding: Minimum N/A N/A Hemostasis Achieved: Pressure N/A N/A Procedural Pain: 0 N/A N/A Post Procedural Pain: 0 N/A N/A Debridement Treatment Procedure was tolerated N/A N/A Response: well Post Debridement 1.9x1.8x0.3 N/A  N/A Measurements L x W x D (cm) Post Debridement 0.806 N/A N/A Volume: (cm) Periwound Skin Texture: Scarring: Yes N/A N/A Excoriation: No Induration: No Callus: No Crepitus: No Rash: No [name], [name] ([id number]) Periwound Skin Maceration: Yes N/A N/A Moisture: Dry/Scaly: No Periwound Skin Color: Ecchymosis: Yes N/A N/A Atrophie Blanche: No Cyanosis: No Erythema: No Hemosiderin Staining: No Mottled: No Pallor: No Rubor: No Temperature: No Abnormality N/A N/A Tenderness on Yes N/A N/A Palpation: Wound Preparation: Ulcer Cleansing: N/A N/A Rinsed/Irrigated with Saline Topical Anesthetic Applied: Other: lidocaine 4% Procedures Performed: Debridement N/A N/A Treatment Notes Wound #1 (Right, Proximal, Anterior Lower Leg) 1. Cleansed with: Clean wound with Normal Saline 2. Anesthetic Topical Lidocaine 4% cream to wound bed prior to debridement Notes snap vac applied today Electronic Signature(s) Signed: 10/25/2016 1:58:19 PM By: Evlyn Kanner MD, FACS Entered By: Evlyn Kanner on 10/25/2016 13:58:18 [name], [name] ([id number]) -------------------------------------------------------------------------------- Multi-Disciplinary Care Plan Details [name] Date of Service: 10/25/2016 1:30 PM Medical Record Number: [id number] Patient Account Number: [id number] Date of Birth/Sex: [birth year] (70 y.o. Female) Treating RN: Huel Coventry Primary Care Sumayah Bearse: Milon Score Other Clinician: Referring Katriana Dortch: Treating Sebrena Engh/Extender: Rudene Re in Treatment: 6 Active Inactive ` Necrotic Tissue Nursing Diagnoses: Impaired tissue integrity related to necrotic/devitalized tissue Goals: Necrotic/devitalized tissue will be minimized in the wound bed Date Initiated: 09/13/2016 Target Resolution Date: 10/18/2016 Goal Status: Active Interventions: Assess patient pain level pre-, during and post procedure and prior to  discharge Notes: ` Orientation to the Wound Care Program Nursing Diagnoses: Knowledge deficit related to the wound healing center program Goals: Patient/caregiver will verbalize understanding of the Wound Healing Center Program Date Initiated: 09/13/2016 Target Resolution Date: 12/20/2016 Goal Status: Active Interventions: Provide education on orientation to the wound center Notes: ` Wound/Skin Impairment Nursing Diagnoses: Impaired tissue integrity [name], [name] ([id number]) Goals: Patient/caregiver will verbalize understanding of skin care regimen Date Initiated: 09/13/2016 Target Resolution Date: 12/20/2016 Goal Status: Active Ulcer/skin breakdown will have a volume reduction of 30% by week 4 Date Initiated: 09/13/2016 Target Resolution Date: 12/20/2016 Goal Status: Active Ulcer/skin breakdown will have a volume reduction of 50% by week 8 Date Initiated: 09/13/2016 Target Resolution Date: 12/20/2016 Goal Status: Active Ulcer/skin breakdown will have a volume reduction of 80% by week 12 Date Initiated: 09/13/2016 Target Resolution Date: 12/20/2016 Goal Status: Active Ulcer/skin breakdown will heal within 14 weeks Date Initiated: 09/13/2016 Target Resolution Date: 12/20/2016 Goal Status: Active Interventions: Assess patient/caregiver ability to  obtain necessary supplies Assess patient/caregiver ability to perform ulcer/skin care regimen upon admission and as needed Assess ulceration(s) every visit Notes: Electronic Signature(s) Signed: 10/25/2016 4:36:54 PM By: Elliot Gurney, RN, BSN, Kim RN, BSN Entered By: Elliot Gurney, RN, BSN, Kim on 10/25/2016 13:43:11 [name], [name] ([id number]) -------------------------------------------------------------------------------- Pain Assessment Details [name] Date of Service: 10/25/2016 1:30 PM Medical Record Number: [id number] Patient Account Number: [id number] Date of Birth/Sex: 05/31/1946 (70 y.o. Female) Treating RN: Huel Coventry Primary  Care Silvie Obremski: Milon Score Other Clinician: Referring Fronnie Urton: Treating Brigid Vandekamp/Extender: Rudene Re in Treatment: 6 Active Problems Location of Pain Severity and Description of Pain Patient Has Paino No Site Locations With Dressing Change: No Pain Management and Medication Current Pain Management: Electronic Signature(s) Signed: 10/25/2016 4:36:54 PM By: Elliot Gurney, RN, BSN, Kim RN, BSN Entered By: Elliot Gurney, RN, BSN, Kim on 10/25/2016 13:29:46 [name], [name] ([id number]) -------------------------------------------------------------------------------- Patient/Caregiver Education Details [name] Date of Service: 10/25/2016 1:30 PM Medical Record Number: [id number] Patient Account Number: [id number] Date of Birth/Gender: 1946/04/23 (70 y.o. Female) Treating RN: Huel Coventry Primary Care Physician: Milon Score Other Clinician: Referring Physician: Treating Physician/Extender: Rudene Re in Treatment: 6 Education Assessment Education Provided To: Patient Education Topics Provided Wound/Skin Impairment: Handouts: Caring for Your Ulcer, Other: continue with snap vac system Methods: Demonstration, Explain/Verbal Responses: State content correctly Electronic Signature(s) Signed: 10/25/2016 4:36:54 PM By: Elliot Gurney, RN, BSN, Kim RN, BSN Entered By: Elliot Gurney, RN, BSN, Kim on 10/25/2016 13:47:16 [name], [name] ([id number]) -------------------------------------------------------------------------------- Wound Assessment Details [name] Date of Service: 10/25/2016 1:30 PM Medical Record Number: [id number] Patient Account Number: [id number] Date of Birth/Sex: [birth year] (70 y.o. Female) Treating RN: Huel Coventry Primary Care Ivoree Felmlee: Milon Score Other Clinician: Referring Lilybeth Vien: Treating Keilany Burnette/Extender: Rudene Re in Treatment: 6 Wound Status Wound Number: 1 Primary Trauma, Other Etiology: Wound Location: Right  Lower Leg - Anterior, Proximal Wound Open Status: Wounding Event: Trauma Comorbid Chronic Obstructive Pulmonary Date Acquired: 08/17/2016 History: Disease (COPD), Hypertension, Type Weeks Of Treatment: 6 II Diabetes Clustered Wound: No Photos Wound Measurements Length: (cm) 1.9 Width: (cm) 1.8 Depth: (cm) 0.2 Area: (cm) 2.686 Volume: (cm) 0.537 % Reduction in Area: 26.5% % Reduction in Volume: -47.1% Epithelialization: Small (1-33%) Tunneling: No Undermining: Yes Starting Position (o'clock): 7 Ending Position (o'clock): 12 Maximum Distance: (cm) 1.4 Wound Description Full Thickness Without Classification: Exposed Support Structures Diabetic Severity Grade 1 (Wagner): Wound Margin: Flat and Intact Exudate Amount: Large Exudate Type: Serous Exudate Color: amber Foul Odor After Cleansing: No Slough/Fibrino No Wound Bed [name], [name] ([id number]) Granulation Amount: Large (67-100%) Exposed Structure Granulation Quality: Red Fascia Exposed: No Necrotic Amount: Small (1-33%) Fat Layer (Subcutaneous Tissue) Exposed: Yes Necrotic Quality: Adherent Slough Tendon Exposed: No Muscle Exposed: No Joint Exposed: No Bone Exposed: No Periwound Skin Texture Texture Color No Abnormalities Noted: No No Abnormalities Noted: No Callus: No Atrophie Blanche: No Crepitus: No Cyanosis: No Excoriation: No Ecchymosis: Yes Induration: No Erythema: No Rash: No Hemosiderin Staining: No Scarring: Yes Mottled: No Pallor: No Moisture Rubor: No No Abnormalities Noted: No Dry / Scaly: No Temperature / Pain Maceration: Yes Temperature: No Abnormality Tenderness on Palpation: Yes Wound Preparation Ulcer Cleansing: Rinsed/Irrigated with Saline Topical Anesthetic Applied: Other: lidocaine 4%, Treatment Notes Wound #1 (Right, Proximal, Anterior Lower Leg) 1. Cleansed with: Clean wound with Normal Saline 2. Anesthetic Topical Lidocaine 4% cream to wound bed prior to  debridement Notes snap vac applied today Electronic Signature(s) Signed: 10/25/2016 4:36:54 PM By: Elliot Gurney,  RN, BSN, Kim RN, BSN Entered By: Elliot Gurney, RN, BSN, Kim on 10/25/2016 13:34:16 [name], [name] ([id number]) -------------------------------------------------------------------------------- Vitals Details [name] Date of Service: 10/25/2016 1:30 PM Medical Record Number: [id number] Patient Account Number: [id number] Date of Birth/Sex: July 19, 1945 (70 y.o. Female) Treating RN: Huel Coventry Primary Care Leasa Kincannon: Milon Score Other Clinician: Referring Nishaan Stanke: Treating Sanel Stemmer/Extender: Rudene Re in Treatment: 6 Vital Signs Time Taken: 13:29 Temperature (F): 97.9 Height (in): 67 Pulse (bpm): 68 Weight (lbs): 197 Respiratory Rate (breaths/min): 16 Body Mass Index (BMI): 30.9 Blood Pressure (mmHg): 121/58 Reference Range: 80 - 120 mg / dl Electronic Signature(s) Signed: 10/25/2016 4:36:54 PM By: Elliot Gurney, RN, BSN, Kim RN, BSN Entered By: Elliot Gurney, RN, BSN, Kim on 10/25/2016 13:30:03

## 2016-10-31 NOTE — Progress Notes (Signed)
[name] ([id number]) Visit Report for 10/25/2016 Chief Complaint Document Details [name] Date of Service: 10/25/2016 1:30 PM Medical Record Number: [id number] Patient Account Number: [id number] Date of Birth/Sex: 31-Mar-1946 (70 y.o. Female) Treating RN: Curtis Sites Primary Care Provider: Milon Score Other Clinician: Referring Provider: Treating Provider/Extender: Rudene Re in Treatment: 6 Information Obtained from: Patient Chief Complaint Patients presents for treatment of an open diabetic ulcer due to a injury to her right lower extremity about 4 weeks Electronic Signature(s) Signed: 10/25/2016 1:58:33 PM By: Evlyn Kanner MD, FACS Entered By: Evlyn Kanner on 10/25/2016 13:58:32 [name] ([id number]) -------------------------------------------------------------------------------- Debridement Details [name] Date of Service: 10/25/2016 1:30 PM Medical Record Number: [id number] Patient Account Number: [id number] Date of Birth/Sex: November 07, 1945 (70 y.o. Female) Treating RN: Curtis Sites Primary Care Provider: Milon Score Other Clinician: Referring Provider: Treating Provider/Extender: Rudene Re in Treatment: 6 Debridement Performed for Wound #1 Right,Proximal,Anterior Lower Leg Assessment: Performed By: Physician Evlyn Kanner, MD Debridement: Debridement Pre-procedure Yes - 13:44 Verification/Time Out Taken: Start Time: 13:44 Pain Control: Other : lidocaine 4% Level: Skin/Subcutaneous Tissue Total Area Debrided (L x 1.9 (cm) x 1.8 (cm) = 3.42 (cm) W): Tissue and other Viable, Fibrin/Slough, Subcutaneous material debrided: Instrument: Curette Bleeding: Minimum Hemostasis Achieved: Pressure End Time: 13:44 Procedural Pain: 0 Post Procedural Pain: 0 Response to Treatment: Procedure was tolerated well Post Debridement Measurements of Total Wound Length: (cm) 1.9 Width: (cm)  1.8 Depth: (cm) 0.3 Volume: (cm) 0.806 Character of Wound/Ulcer Post Improved Debridement: Severity of Tissue Post Debridement: Fat layer exposed Post Procedure Diagnosis Same as Pre-procedure Electronic Signature(s) Signed: 10/25/2016 1:58:27 PM By: Evlyn Kanner MD, FACS Signed: 10/30/2016 4:56:33 PM By: Curtis Sites Entered By: Evlyn Kanner on 10/25/2016 13:58:26 [name], [name] ([id number]) [name], [name] ([id number]) -------------------------------------------------------------------------------- HPI Details [name] Date of Service: 10/25/2016 1:30 PM Medical Record Number: [id number] Patient Account Number: [id number] Date of Birth/Sex: Sep 15, 1945 (70 y.o. Female) Treating RN: Curtis Sites Primary Care Provider: Milon Score Other Clinician: Referring Provider: Treating Provider/Extender: Rudene Re in Treatment: 6 History of Present Illness Location: right lower extremity in the area of the lateral calf below the knee Quality: Patient reports experiencing a sharp pain to affected area(s). Severity: Patient states wound are getting worse. Duration: Patient has had the wound for < 4 weeks prior to presenting for treatment Timing: Pain in wound is constant (hurts all the time) Context: The wound occurred when the patient a blunt injury and fall Modifying Factors: Other treatment(s) tried include:courses of doxycycline and local Bactroban ointment Associated Signs and Symptoms: Patient reports having increase discharge. HPI Description: 71 year old patient has been referred to as with right lower extremity wound with edema and ecchymosis, after having a injury due to a fall on 08/20/2016. she has a history of diabetes mellitus type 2 which is uncomplicated, chronic back pain, COPD, hypertension, hypothyroidism, supraventricular tachycardia and tobacco abuse. He is status post neck surgery, appendectomy, cholecystectomy, hernia repair. He  smokes about half a pack of cigarettes a day for the last 50 years. Recently on doxycycline for 10 days and was given symptomatic treatment for wound. 10/04/2016 -- the patient tolerated the snap vacuum system well and had no significant problems. She continues to smoke and is trying to give it up. 10/11/2016 -- she continues to tolerate the snap like him system fairly well and other than that she has no fresh issues Electronic Signature(s) Signed: 10/25/2016 1:58:37  PM By: Evlyn Kanner MD, FACS Entered By: Evlyn Kanner on 10/25/2016 13:58:37 [name] ([id number]) -------------------------------------------------------------------------------- Physical Exam Details [name] Date of Service: 10/25/2016 1:30 PM Medical Record Number: [id number] Patient Account Number: [id number] Date of Birth/Sex: 09-07-1945 (70 y.o. Female) Treating RN: Curtis Sites Primary Care Provider: Milon Score Other Clinician: Referring Provider: Treating Provider/Extender: Rudene Re in Treatment: 6 Constitutional . Pulse regular. Respirations normal and unlabored. Afebrile. . Eyes Nonicteric. Reactive to light. Ears, Nose, Mouth, and Throat Lips, teeth, and gums WNL.Marland Kitchen Moist mucosa without lesions. Neck supple and nontender. No palpable supraclavicular or cervical adenopathy. Normal sized without goiter. Respiratory WNL. No retractions.. Breath sounds WNL, No rubs, rales, rhonchi, or wheeze.. Cardiovascular Heart rhythm and rate regular, no murmur or gallop.. Pedal Pulses WNL. No clubbing, cyanosis or edema. Chest Breasts symmetical and no nipple discharge.. Breast tissue WNL, no masses, lumps, or tenderness.. Lymphatic No adneopathy. No adenopathy. No adenopathy. Musculoskeletal Adexa without tenderness or enlargement.. Digits and nails w/o clubbing, cyanosis, infection, petechiae, ischemia, or inflammatory conditions.. Integumentary (Hair, Skin) No suspicious  lesions. No crepitus or fluctuance. No peri-wound warmth or erythema. No masses.Marland Kitchen Psychiatric Judgement and insight Intact.. No evidence of depression, anxiety, or agitation.. Notes there continues to be a fairly deep undermining between the 7 and 11:00 position and she still required sharp debridement of some of the subcutaneous debris. A #3 curet was used and bleeding controlled with pressure Electronic Signature(s) Signed: 10/25/2016 1:59:12 PM By: Evlyn Kanner MD, FACS Entered By: Evlyn Kanner on 10/25/2016 13:59:12 [name], [name] ([id number]) -------------------------------------------------------------------------------- Physician Orders Details [name] Date of Service: 10/25/2016 1:30 PM Medical Record Number: [id number] Patient Account Number: [id number] Date of Birth/Sex: [birth year] (70 y.o. Female) Treating RN: Huel Coventry Primary Care Provider: Milon Score Other Clinician: Referring Provider: Treating Provider/Extender: Rudene Re in Treatment: 6 Verbal / Phone Orders: No Diagnosis Coding Wound Cleansing Wound #1 Right,Proximal,Anterior Lower Leg o Clean wound with Normal Saline. o May Shower, gently pat wound dry prior to applying new dressing. Anesthetic Wound #1 Right,Proximal,Anterior Lower Leg o Topical Lidocaine 4% cream applied to wound bed prior to debridement Skin Barriers/Peri-Wound Care Wound #1 Right,Proximal,Anterior Lower Leg o Skin Prep Dressing Change Frequency Wound #1 Right,Proximal,Anterior Lower Leg o Change dressing every week Follow-up Appointments Wound #1 Right,Proximal,Anterior Lower Leg o Return Appointment in 1 week. o Nurse Visit as needed Edema Control Wound #1 Right,Proximal,Anterior Lower Leg o Elevate legs to the level of the heart and pump ankles as often as possible Additional Orders / Instructions Wound #1 Right,Proximal,Anterior Lower Leg o Stop Smoking o Increase protein  intake. o Other: - Please try to keep blood sugars below 180 Negative Pressure Wound Therapy Wound #1 Right,Proximal,Anterior Lower Leg o Other: - SNAP Vac continuous at 125 mmHg [name], [name] ([id number]) Electronic Signature(s) Signed: 10/25/2016 4:08:29 PM By: Evlyn Kanner MD, FACS Entered By: Evlyn Kanner on 10/25/2016 13:59:21 [name], [name] ([id number]) -------------------------------------------------------------------------------- Problem List Details [name] Date of Service: 10/25/2016 1:30 PM Medical Record Number: [id number] Patient Account Number: [id number] Date of Birth/Sex: February 25, 1946 (70 y.o. 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 Female) Treating RN: Curtis Sites Primary Care Provider: Milon Score Other Clinician: Referring Provider: Treating Provider/Extender: Rudene Re in Treatment: 6 Subjective Chief  Complaint Information obtained from Patient Patients presents for treatment of an open diabetic ulcer due to a injury to her right lower extremity about 4 weeks History of Present Illness (HPI) The following HPI elements were documented for the patient's wound: Location: right lower extremity in the area of the lateral calf below the knee Quality: Patient reports experiencing a sharp pain to affected area(s). Severity: Patient states wound are getting worse. Duration: Patient has had the wound for < 4 weeks prior to presenting for treatment Timing: Pain in wound is constant (hurts all the time) Context: The wound occurred when the patient a blunt injury and fall Modifying Factors: Other treatment(s) tried include:courses of doxycycline and local Bactroban ointment Associated Signs and Symptoms: Patient reports having increase discharge. 71 year old patient has been referred to as with right lower extremity wound with edema and ecchymosis, after having a injury due to a fall on 08/20/2016. she has a history of diabetes mellitus type 2 which is uncomplicated, chronic back pain, COPD, hypertension, hypothyroidism, supraventricular tachycardia and tobacco abuse. He is status post neck surgery, appendectomy, cholecystectomy, hernia repair. He smokes about half a pack of cigarettes a day for the last 50 years. Recently on doxycycline for 10 days and was given symptomatic treatment for wound. 10/04/2016 -- the patient tolerated the snap vacuum system well and had no significant problems. She continues to smoke and is trying to give it up. 10/11/2016 -- she continues to tolerate the snap like him system fairly well and other than that she has no fresh issues Objective Constitutional Pulse regular. Respirations normal and unlabored. Afebrile. [name], [name] ([id number]) Vitals Time Taken: 1:29 PM, Height: 67 in, Weight: 197 lbs, BMI: 30.9, Temperature: 97.9 F, Pulse: 68 bpm, Respiratory Rate:  16 breaths/min, Blood Pressure: 121/58 mmHg. Eyes Nonicteric. Reactive to light. Ears, Nose, Mouth, and Throat Lips, teeth, and gums WNL.Marland Kitchen Moist mucosa without lesions. Neck supple and nontender. No palpable supraclavicular or cervical adenopathy. Normal sized without goiter. Respiratory WNL. No retractions.. Breath sounds WNL, No rubs, rales, rhonchi, or wheeze.. Cardiovascular Heart rhythm and rate regular, no murmur or gallop.. Pedal Pulses WNL. No clubbing, cyanosis or edema. Chest Breasts symmetical and no nipple discharge.. Breast tissue WNL, no masses, lumps, or tenderness.. Lymphatic No adneopathy. No adenopathy. No adenopathy. Musculoskeletal Adexa without tenderness or enlargement.. Digits and nails w/o clubbing, cyanosis, infection, petechiae, ischemia, or inflammatory conditions.Marland Kitchen Psychiatric Judgement and insight Intact.. No evidence of depression, anxiety, or agitation.. General Notes: there continues to be a fairly deep undermining between the 7 and 11:00 position and she still required sharp debridement of some of the subcutaneous debris. A #3 curet was used and bleeding controlled with pressure Integumentary (Hair, Skin) No suspicious lesions. No crepitus or fluctuance. No peri-wound warmth or erythema. No masses.. Wound #1 status is Open. Original cause of wound was Trauma. The wound is located on the Right,Proximal,Anterior Lower Leg. The  wound measures 1.9cm length x 1.8cm width x 0.2cm depth; 2.686cm^2 area and 0.537cm^3 volume. There is Fat Layer (Subcutaneous Tissue) Exposed exposed. There is no tunneling noted, however, there is undermining starting at 7:00 and ending at 12:00 with a maximum distance of 1.4cm. There is a large amount of serous drainage noted. The wound margin is flat and intact. There is large (67-100%) red granulation within the wound bed. There is a small (1-33%) amount of necrotic tissue within the wound bed including Adherent Slough. The  periwound skin appearance exhibited: Scarring, Maceration, Ecchymosis. The periwound skin appearance did not exhibit: Callus, Crepitus, Excoriation, Induration, Rash, Dry/Scaly, Atrophie Blanche, Cyanosis, Hemosiderin Staining, Mottled, Pallor, Rubor, Erythema. Periwound temperature was noted as No Abnormality. The periwound [name], [name] ([id number]) has tenderness on palpation. Assessment Active Problems ICD-10 E11.622 - Type 2 diabetes mellitus with other skin ulcer M70.861 - Other soft tissue disorders related to use, overuse and pressure, right lower leg L97.212 - Non-pressure chronic ulcer of right calf with fat layer exposed F17.218 - Nicotine dependence, cigarettes, with other nicotine-induced disorders Procedures Wound #1 Wound #1 is a Trauma, Other located on the Right,Proximal,Anterior Lower Leg . There was a Skin/Subcutaneous Tissue Debridement ([id number]) debridement with total area of 3.42 sq cm performed by Evlyn Kanner, MD. with the following instrument(s): Curette to remove Viable tissue/material including Fibrin/Slough and Subcutaneous after achieving pain control using Other (lidocaine 4%). A time out was conducted at 13:44, prior to the start of the procedure. A Minimum amount of bleeding was controlled with Pressure. The procedure was tolerated well with a pain level of 0 throughout and a pain level of 0 following the procedure. Post Debridement Measurements: 1.9cm length x 1.8cm width x 0.3cm depth; 0.806cm^3 volume. Character of Wound/Ulcer Post Debridement is improved. Severity of Tissue Post Debridement is: Fat layer exposed. Post procedure Diagnosis Wound #1: Same as Pre-Procedure Plan Wound Cleansing: Wound #1 Right,Proximal,Anterior Lower Leg: Clean wound with Normal Saline. May Shower, gently pat wound dry prior to applying new dressing. Anesthetic: Wound #1 Right,Proximal,Anterior Lower Leg: Topical Lidocaine 4% cream applied to wound bed prior to  debridement [name], [name] ([id number]) Skin Barriers/Peri-Wound Care: Wound #1 Right,Proximal,Anterior Lower Leg: Skin Prep Dressing Change Frequency: Wound #1 Right,Proximal,Anterior Lower Leg: Change dressing every week Follow-up Appointments: Wound #1 Right,Proximal,Anterior Lower Leg: Return Appointment in 1 week. Nurse Visit as needed Edema Control: Wound #1 Right,Proximal,Anterior Lower Leg: Elevate legs to the level of the heart and pump ankles as often as possible Additional Orders / Instructions: Wound #1 Right,Proximal,Anterior Lower Leg: Stop Smoking Increase protein intake. Other: - Please try to keep blood sugars below 180 Negative Pressure Wound Therapy: Wound #1 Right,Proximal,Anterior Lower Leg: Other: - SNAP Vac continuous at 125 mmHg I have recommended: 1. care of the snap vacuum device has been discussed with them and we will see her back next week for a change 2. good control of her diabetes mellitus 3. adequate protein, vitamin C, vitamin A and zinc 4. regular review at the wound center. 5. has not yet given up smoking and is working on it Psychologist, prison and probation services) Signed: 10/25/2016 2:00:09 PM By: Evlyn Kanner MD, FACS Entered By: Evlyn Kanner on 10/25/2016 14:00:09 [name], [name] ([id number]) -------------------------------------------------------------------------------- SuperBill Details [name] Date of Service: 10/25/2016 Medical Record Number: [id number] Patient Account Number: [id number] Date of Birth/Sex: 01-04-1946 (70 y.o. Female) Treating RN: Curtis Sites Primary Care Provider: Milon Score Other Clinician: Referring Provider: Treating Provider/Extender: Rudene Re in Treatment:  6 Diagnosis Coding ICD-10 Codes Code Description E11.622 Type 2 diabetes mellitus with other skin ulcer M70.861 Other soft tissue disorders related to use, overuse and pressure, right lower leg L97.212 Non-pressure chronic  ulcer of right calf with fat layer exposed F17.218 Nicotine dependence, cigarettes, with other nicotine-induced disorders Facility Procedures CPT4: Description Modifier Quantity Code 16109604 11042 - DEB SUBQ TISSUE 20 SQ CM/< 1 ICD-10 Description Diagnosis E11.622 Type 2 diabetes mellitus with other skin ulcer M70.861 Other soft tissue disorders related to use, overuse and pressure, right  lower leg L97.212 Non-pressure chronic ulcer of right calf with fat layer exposed F17.218 Nicotine dependence, cigarettes, with other nicotine-induced disorders Physician Procedures CPT4: Description Modifier Quantity Code 5409811 11042 - WC PHYS SUBQ TISS 20 SQ CM 1 ICD-10 Description Diagnosis E11.622 Type 2 diabetes mellitus with other skin ulcer M70.861 Other soft tissue disorders related to use, overuse and pressure, right  lower leg L97.212 Non-pressure chronic ulcer of right calf with fat layer exposed F17.218 Nicotine dependence, cigarettes, with other nicotine-induced disorders Electronic Signature(s) Signed: 10/25/2016 2:00:18 PM By: Evlyn Kanner MD, FACS [name], [name] ([id number]) Entered By: Evlyn Kanner on 10/25/2016 14:00:18

## 2016-11-01 ENCOUNTER — Encounter: Payer: Medicare Other | Admitting: Nurse Practitioner

## 2016-11-01 DIAGNOSIS — E11622 Type 2 diabetes mellitus with other skin ulcer: Secondary | ICD-10-CM | POA: Diagnosis not present

## 2016-11-05 NOTE — Progress Notes (Signed)
[name], [name] ([id number]) Visit Report for 11/01/2016 Arrival Information Details [name], [name] Date of Service: 11/01/2016 1:30 PM Medical Record Number: [id number] Patient Account Number: [id number] Date of Birth/Sex: 1946-02-19 (70 y.o. Female) Treating RN: Curtis Sites Primary Care Marnita Poirier: Milon Score Other Clinician: Referring Darrold Bezek: Treating Chyanne Kohut/Extender: Kathreen Cosier in Treatment: 7 Visit Information History Since Last Visit Added or deleted any medications: No Patient Arrived: Ambulatory Any new allergies or adverse reactions: No Arrival Time: 13:31 Had a fall or experienced change in No Accompanied By: [facility] [employer] activities of daily living that may affect Transfer Assistance: None risk of falls: Patient Identification Verified: Yes Signs or symptoms of abuse/neglect since last No Secondary Verification Process Yes visito Completed: Hospitalized since last visit: No Patient Has Alerts: Yes Has Dressing in Place as Prescribed: Yes Patient Alerts: DMII Pain Present Now: No Electronic Signature(s) Signed: 11/04/2016 4:37:28 PM By: Curtis Sites Entered By: Curtis Sites on 11/01/2016 13:31:51 [name] ([id number]) -------------------------------------------------------------------------------- Clinic Level of Care Assessment Details [name] Date of Service: 11/01/2016 1:30 PM Medical Record Number: [id number] Patient Account Number: [id number] Date of Birth/Sex: 10/13/1945 (70 y.o. Female) Treating RN: Curtis Sites Primary Care Xoey Warmoth: Milon Score Other Clinician: Referring [place] Flett: Treating Nile Dorning/Extender: Kathreen Cosier in Treatment: 7 Clinic Level of Care Assessment Items TOOL 4 Quantity Score  - Use when only an EandM is performed on FOLLOW-UP visit 0 ASSESSMENTS - Nursing Assessment / Reassessment X - Reassessment of Co-morbidities (includes updates in patient status) 1 10 X -  Reassessment of Adherence to Treatment Plan 1 5 ASSESSMENTS - Wound and Skin Assessment / Reassessment X - Simple Wound Assessment / Reassessment - one wound 1 5  - Complex Wound Assessment / Reassessment - multiple wounds 0  - Dermatologic / Skin Assessment (not related to wound area) 0 ASSESSMENTS - Focused Assessment  - Circumferential Edema Measurements - multi extremities 0  - Nutritional Assessment / Counseling / Intervention 0 X - Lower Extremity Assessment (monofilament, tuning fork, pulses) 1 5  - Peripheral Arterial Disease Assessment (using hand held doppler) 0 ASSESSMENTS - Ostomy and/or Continence Assessment and Care  - Incontinence Assessment and Management 0  - Ostomy Care Assessment and Management (repouching, etc.) 0 PROCESS - Coordination of Care X - Simple Patient / Family Education for ongoing care 1 15  - Complex (extensive) Patient / Family Education for ongoing care 0  - Staff obtains Chiropractor, Records, Test Results / Process Orders 0  - Staff telephones HHA, Nursing Homes / Clarify orders / etc 0  - Routine Transfer to another Facility (non-emergent condition) 0 [name], [name] ([id number])  - Routine Hospital Admission (non-emergent condition) 0  - New Admissions / Manufacturing engineer / Ordering NPWT, Apligraf, etc. 0  - Emergency Hospital Admission (emergent condition) 0 X - Simple Discharge Coordination 1 10  - Complex (extensive) Discharge Coordination 0 PROCESS - Special Needs  - Pediatric / Minor Patient Management 0  - Isolation Patient Management 0  - Hearing / Language / Visual special needs 0  - Assessment of Community assistance (transportation, D/C planning, etc.) 0  - Additional assistance / Altered mentation 0  - Support Surface(s) Assessment (bed, cushion, seat, etc.) 0 INTERVENTIONS - Wound Cleansing / Measurement X - Simple Wound Cleansing - one wound 1 5  - Complex Wound Cleansing - multiple  wounds 0 X - Wound Imaging (photographs - any number of wounds) 1 5  - Wound Tracing (instead of photographs)  0 X - Simple Wound Measurement - one wound 1 5 []  - Complex Wound Measurement - multiple wounds 0 INTERVENTIONS - Wound Dressings []  - Small Wound Dressing one or multiple wounds 0 X - Medium Wound Dressing one or multiple wounds 1 15 []  - Large Wound Dressing one or multiple wounds 0 []  - Application of Medications - topical 0 []  - Application of Medications - injection 0 INTERVENTIONS - Miscellaneous []  - External ear exam 0 [name], [name] ([id number]) []  - Specimen Collection (cultures, biopsies, blood, body fluids, etc.) 0 []  - Specimen(s) / Culture(s) sent or taken to Lab for analysis 0 []  - Patient Transfer (multiple staff / Michiel Sites Lift / Similar devices) 0 []  - Simple Staple / Suture removal (25 or less) 0 []  - Complex Staple / Suture removal (26 or more) 0 []  - Hypo / Hyperglycemic Management (close monitor of Blood Glucose) 0 []  - Ankle / Brachial Index (ABI) - do not check if billed separately 0 X - Vital Signs 1 5 Has the patient been seen at the hospital within the last three years: Yes Total Score: 85 Level Of Care: New/Established - Level 3 Electronic Signature(s) Signed: 11/04/2016 4:37:28 PM By: Curtis Sites Entered By: Curtis Sites on 11/01/2016 14:22:06 [name] ([id number]) -------------------------------------------------------------------------------- Encounter Discharge Information Details [name] Date of Service: 11/01/2016 1:30 PM Medical Record Number: [id number] Patient Account Number: [id number] Date of Birth/Sex: 1945/12/18 (70 y.o. Female) Treating RN: Curtis Sites Primary Care Analyssa Downs: Milon Score Other Clinician: Referring Carmeron Heady: Treating Recie Cirrincione/Extender: Kathreen Cosier in Treatment: 7 Encounter Discharge Information Items Discharge Pain Level: 0 Discharge Condition: Stable Ambulatory  Status: Ambulatory Discharge Destination: Home Transportation: Private Auto Accompanied By: [facility] reps Schedule Follow-up Appointment: Yes Medication Reconciliation completed and provided to Patient/Care No Lakishia Bourassa: Provided on Clinical Summary of Care: 11/01/2016 Form Type Recipient Paper Patient ML Electronic Signature(s) Signed: 11/01/2016 2:20:01 PM By: Gwenlyn Perking Entered By: Gwenlyn Perking on 11/01/2016 14:20:00 [name] ([id number]) -------------------------------------------------------------------------------- Lower Extremity Assessment Details [name] Date of Service: 11/01/2016 1:30 PM Medical Record Number: [id number] Patient Account Number: [id number] Date of Birth/Sex: 07-04-1946 (70 y.o. Female) Treating RN: Curtis Sites Primary Care Elza Sortor: Milon Score Other Clinician: Referring Elmon Shader: Treating Dotsie Gillette/Extender: Kathreen Cosier in Treatment: 7 Vascular Assessment Pulses: Dorsalis Pedis Palpable: [Right:Yes] Posterior Tibial Extremity colors, hair growth, and conditions: Extremity Color: [Right:Normal] Hair Growth on Extremity: [Right:Yes] Temperature of Extremity: [Right:Warm] Capillary Refill: [Right:< 3 seconds] Electronic Signature(s) Signed: 11/04/2016 4:37:28 PM By: Curtis Sites Entered By: Curtis Sites on 11/01/2016 13:58:28 [name] ([id number]) -------------------------------------------------------------------------------- Multi Wound Chart Details [name] Date of Service: 11/01/2016 1:30 PM Medical Record Number: [id number] Patient Account Number: [id number] Date of Birth/Sex: [birth year] (70 y.o. Female) Treating RN: Curtis Sites Primary Care Shawne Bulow: Milon Score Other Clinician: Referring Shelsie Tijerino: Treating Merari Pion/Extender: Kathreen Cosier in Treatment: 7 Vital Signs Height(in): 67 Pulse(bpm): 67 Weight(lbs): 197 Blood Pressure 146/57 (mmHg): Body Mass  Index(BMI): 31 Temperature(F): 97.6 Respiratory Rate 18 (breaths/min): Photos: [1:No Photos] [N/A:N/A] Wound Location: [1:Right Lower Leg - Anterior, Proximal] [N/A:N/A] Wounding Event: [1:Trauma] [N/A:N/A] Primary Etiology: [1:Trauma, Other] [N/A:N/A] Comorbid History: [1:Chronic Obstructive Pulmonary Disease (COPD), Hypertension, Type II Diabetes] [N/A:N/A] Date Acquired: [1:08/17/2016] [N/A:N/A] Weeks of Treatment: [1:7] [N/A:N/A] Wound Status: [1:Open] [N/A:N/A] Measurements L x W x D 2.2x1.7x0.2 [N/A:N/A] (cm) Area (cm) : [1:2.937] [N/A:N/A] Volume (cm) : [1:0.587] [N/A:N/A] % Reduction in Area: [1:19.60%] [N/A:N/A] % Reduction in Volume: -60.80% [N/A:N/A]  Starting Position 1 7 (o'clock): Ending Position 1 [1:12] (o'clock): Maximum Distance 1 0.4 (cm): Undermining: [1:Yes] [N/A:N/A] Classification: [1:Full Thickness Without Exposed Support Structures] [N/A:N/A] HBO Classification: [1:Grade 1] [N/A:N/A] Exudate Amount: [1:Large] [N/A:N/A] Exudate Type: Serous N/A N/A Exudate Color: amber N/A N/A Wound Margin: Flat and Intact N/A N/A Granulation Amount: Large (67-100%) N/A N/A Granulation Quality: Red N/A N/A Necrotic Amount: Small (1-33%) N/A N/A Exposed Structures: Fat Layer (Subcutaneous N/A N/A Tissue) Exposed: Yes Fascia: No Tendon: No Muscle: No Joint: No Bone: No Epithelialization: Small (1-33%) N/A N/A Periwound Skin Texture: Scarring: Yes N/A N/A Excoriation: No Induration: No Callus: No Crepitus: No Rash: No Periwound Skin Maceration: Yes N/A N/A Moisture: Dry/Scaly: No Periwound Skin Color: Ecchymosis: Yes N/A N/A Atrophie Blanche: No Cyanosis: No Erythema: No Hemosiderin Staining: No Mottled: No Pallor: No Rubor: No Temperature: No Abnormality N/A N/A Tenderness on Yes N/A N/A Palpation: Wound Preparation: Ulcer Cleansing: N/A N/A Rinsed/Irrigated with Saline Topical Anesthetic Applied: Other: lidocaine 4% Treatment  Notes Electronic Signature(s) Signed: 11/01/2016 2:14:43 PM By: Bonnell Public Entered By: Bonnell Public on 11/01/2016 14:14:42 [name], [name] ([id number]) -------------------------------------------------------------------------------- Multi-Disciplinary Care Plan Details [name] Date of Service: 11/01/2016 1:30 PM Medical Record Number: [id number] Patient Account Number: [id number] Date of Birth/Sex: 06/17/1946 (70 y.o. Female) Treating RN: Curtis Sites Primary Care Xiong Haidar: Milon Score Other Clinician: Referring Leanah Kolander: Treating Nioma Mccubbins/Extender: Kathreen Cosier in Treatment: 7 Active Inactive ` Necrotic Tissue Nursing Diagnoses: Impaired tissue integrity related to necrotic/devitalized tissue Goals: Necrotic/devitalized tissue will be minimized in the wound bed Date Initiated: 09/13/2016 Target Resolution Date: 10/18/2016 Goal Status: Active Interventions: Assess patient pain level pre-, during and post procedure and prior to discharge Notes: ` Orientation to the Wound Care Program Nursing Diagnoses: Knowledge deficit related to the wound healing center program Goals: Patient/caregiver will verbalize understanding of the Wound Healing Center Program Date Initiated: 09/13/2016 Target Resolution Date: 12/20/2016 Goal Status: Active Interventions: Provide education on orientation to the wound center Notes: ` Wound/Skin Impairment Nursing Diagnoses: Impaired tissue integrity [name], [name] ([id number]) Goals: Patient/caregiver will verbalize understanding of skin care regimen Date Initiated: 09/13/2016 Target Resolution Date: 12/20/2016 Goal Status: Active Ulcer/skin breakdown will have a volume reduction of 30% by week 4 Date Initiated: 09/13/2016 Target Resolution Date: 12/20/2016 Goal Status: Active Ulcer/skin breakdown will have a volume reduction of 50% by week 8 Date Initiated: 09/13/2016 Target Resolution Date: 12/20/2016 Goal Status:  Active Ulcer/skin breakdown will have a volume reduction of 80% by week 12 Date Initiated: 09/13/2016 Target Resolution Date: 12/20/2016 Goal Status: Active Ulcer/skin breakdown will heal within 14 weeks Date Initiated: 09/13/2016 Target Resolution Date: 12/20/2016 Goal Status: Active Interventions: Assess patient/caregiver ability to obtain necessary supplies Assess patient/caregiver ability to perform ulcer/skin care regimen upon admission and as needed Assess ulceration(s) every visit Notes: Electronic Signature(s) Signed: 11/04/2016 4:37:28 PM By: Curtis Sites Entered By: Curtis Sites on 11/01/2016 13:58:34 [name] ([id number]) -------------------------------------------------------------------------------- Pain Assessment Details [name] Date of Service: 11/01/2016 1:30 PM Medical Record Number: [id number] Patient Account Number: [id number] Date of Birth/Sex: [birth year] (70 y.o. Female) Treating RN: Curtis Sites Primary Care Ashaad [name]: Milon Score Other Clinician: Referring Langston Summerfield: Treating Jillyn Stacey/Extender: Kathreen Cosier in Treatment: 7 Active Problems Location of Pain Severity and Description of Pain Patient Has Paino No Site Locations Pain Management and Medication Current Pain Management: Notes Topical or injectable lidocaine is offered to patient for acute pain when surgical debridement is performed. If needed, Patient is instructed to  use over the counter pain medication for the following 24-48 hours after debridement. Wound care MDs do not prescribed pain medications. Patient has chronic pain or uncontrolled pain. Patient has been instructed to make an appointment with their Primary Care Physician for pain management. Electronic Signature(s) Signed: 11/04/2016 4:37:28 PM By: Curtis Sites Entered By: Curtis Sites on 11/01/2016 13:32:52 [name]  ([id number]) -------------------------------------------------------------------------------- Patient/Caregiver Education Details [name] Date of Service: 11/01/2016 1:30 PM Medical Record Number: [id number] Patient Account Number: [id number] Date of Birth/Gender: [birth year] (70 y.o. Female) Treating RN: Curtis Sites Primary Care Physician: Milon Score Other Clinician: Referring Physician: Treating Physician/Extender: Kathreen Cosier in Treatment: 7 Education Assessment Education Provided To: Patient Education Topics Provided Wound/Skin Impairment: Handouts: Other: snap vac use Methods: Demonstration, Explain/Verbal Responses: State content correctly Electronic Signature(s) Signed: 11/04/2016 4:37:28 PM By: Curtis Sites Entered By: Curtis Sites on 11/01/2016 14:00:34 [name] ([id number]) -------------------------------------------------------------------------------- Wound Assessment Details [name] Date of Service: 11/01/2016 1:30 PM Medical Record Number: [id number] Patient Account Number: [id number] Date of Birth/Sex: 1946/03/13 (70 y.o. Female) Treating RN: Curtis Sites Primary Care Kristena Wilhelmi: Milon Score Other Clinician: Referring Shaquon Gropp: Treating Jabree Pernice/Extender: Kathreen Cosier in Treatment: 7 Wound Status Wound Number: 1 Primary Trauma, Other Etiology: Wound Location: Right Lower Leg - Anterior, Proximal Wound Open Status: Wounding Event: Trauma Comorbid Chronic Obstructive Pulmonary Date Acquired: 08/17/2016 History: Disease (COPD), Hypertension, Type Weeks Of Treatment: 7 II Diabetes Clustered Wound: No Photos Photo Uploaded By: Curtis Sites on 11/01/2016 16:05:49 Wound Measurements Length: (cm) 2.2 Width: (cm) 1.7 Depth: (cm) 0.2 Area: (cm) 2.937 Volume: (cm) 0.587 % Reduction in Area: 19.6% % Reduction in Volume: -60.8% Epithelialization: Small (1-33%) Tunneling:  No Undermining: Yes Starting Position (o'clock): 7 Ending Position (o'clock): 12 Maximum Distance: (cm) 0.4 Wound Description Full Thickness Without Classification: Exposed Support Structures Diabetic Severity Grade 1 (Wagner): Wound Margin: Flat and Intact Exudate Amount: Large [name], [name] ([id number]) Foul Odor After Cleansing: No Slough/Fibrino No Exudate Type: Serous Exudate Color: amber Wound Bed Granulation Amount: Large (67-100%) Exposed Structure Granulation Quality: Red Fascia Exposed: No Necrotic Amount: Small (1-33%) Fat Layer (Subcutaneous Tissue) Exposed: Yes Necrotic Quality: Adherent Slough Tendon Exposed: No Muscle Exposed: No Joint Exposed: No Bone Exposed: No Periwound Skin Texture Texture Color No Abnormalities Noted: No No Abnormalities Noted: No Callus: No Atrophie Blanche: No Crepitus: No Cyanosis: No Excoriation: No Ecchymosis: Yes Induration: No Erythema: No Rash: No Hemosiderin Staining: No Scarring: Yes Mottled: No Pallor: No Moisture Rubor: No No Abnormalities Noted: No Dry / Scaly: No Temperature / Pain Maceration: Yes Temperature: No Abnormality Tenderness on Palpation: Yes Wound Preparation Ulcer Cleansing: Rinsed/Irrigated with Saline Topical Anesthetic Applied: Other: lidocaine 4%, Treatment Notes Wound #1 (Right, Proximal, Anterior Lower Leg) 1. Cleansed with: Clean wound with Normal Saline 2. Anesthetic Topical Lidocaine 4% cream to wound bed prior to debridement 3. Peri-wound Care: Skin Prep 4. Dressing Applied: Promogran 8. Negative Pressure Wound Therapy Other (specify in notes) Notes snap vac applied today [name], [name] ([id number]) Electronic Signature(s) Signed: 11/04/2016 4:37:28 PM By: Curtis Sites Entered By: Curtis Sites on 11/01/2016 13:43:19 [name], [name] ([id number]) -------------------------------------------------------------------------------- Vitals Details Patient Name:  [name] Date of Service: 11/01/2016 1:30 PM Medical Record Number: [id number] Patient Account Number: [id number] Date of Birth/Sex: September 03, 1945 (71 y.o. Female) Treating RN: Curtis Sites Primary Care Deania Siguenza: Milon Score Other Clinician: Referring Layna Roeper: Treating Rosanna Bickle/Extender: Kathreen Cosier in Treatment: 7 Vital Signs Time Taken: 13:34 Temperature (F):  97.6 Height (in): 67 Pulse (bpm): 67 Weight (lbs): 197 Respiratory Rate (breaths/min): 18 Body Mass Index (BMI): 30.9 Blood Pressure (mmHg): 146/57 Reference Range: 80 - 120 mg / dl Electronic Signature(s) Signed: 11/04/2016 4:37:28 PM By: Curtis Sites Entered By: Curtis Sites on 11/01/2016 13:34:24

## 2016-11-05 NOTE — Progress Notes (Signed)
ASHWIKA, FREELS ([id number]) Visit Report for 11/01/2016 Chief Complaint Document Details [name], [name] Date of Service: 11/01/2016 1:30 PM Medical Record Number: [id number] Patient Account Number: [id number] Date of Birth/Sex: Aug 25, 1945 (70 y.o. Female) Treating RN: Curtis Sites Primary Care Provider: Milon Score Other Clinician: Referring Provider: Treating Provider/Extender: Kathreen Cosier in Treatment: 7 Information Obtained from: Patient Chief Complaint patient is here for follow-up evaluation of a right lower extremity ulcer Electronic Signature(s) Signed: 11/01/2016 2:15:05 PM By: Bonnell Public Entered By: Bonnell Public on 11/01/2016 14:15:05 [name] ([id number]) -------------------------------------------------------------------------------- HPI Details [name] Date of Service: 11/01/2016 1:30 PM Medical Record Number: [id number] Patient Account Number: [id number] Date of Birth/Sex: February 28, 1946 (70 y.o. Female) Treating RN: Curtis Sites Primary Care Provider: Milon Score Other Clinician: Referring Provider: Treating Provider/Extender: Kathreen Cosier in Treatment: 7 History of Present Illness Location: right lower extremity in the area of the lateral calf below the knee Quality: Patient reports experiencing a sharp pain to affected area(s). Severity: Patient states wound are getting worse. Duration: Patient has had the wound for < 4 weeks prior to presenting for treatment Timing: Pain in wound is constant (hurts all the time) Context: The wound occurred when the patient a blunt injury and fall Modifying Factors: Other treatment(s) tried include:courses of doxycycline and local Bactroban ointment Associated Signs and Symptoms: Patient reports having increase discharge. HPI Description: 71 year old patient has been referred to as with right lower extremity wound with edema and ecchymosis, after having a injury due  to a fall on 08/20/2016. she has a history of diabetes mellitus type 2 which is uncomplicated, chronic back pain, COPD, hypertension, hypothyroidism, supraventricular tachycardia and tobacco abuse. He is status post neck surgery, appendectomy, cholecystectomy, hernia repair. He smokes about half a pack of cigarettes a day for the last 50 years. Recently on doxycycline for 10 days and was given symptomatic treatment for wound. 10/04/2016 -- the patient tolerated the snap vacuum system well and had no significant problems. She continues to smoke and is trying to give it up. 10/11/2016 -- she continues to tolerate the snap like him system fairly well and other than that she has no fresh issues 11/01/16- she is here for follow-up evaluation of her right lower extremity ulcer. She is tolerating the SNAP negative pressure system. She is voicing no complaints of pain. She states her glucose levels are consistently less than 150 with her evening glucose levels being higher than morning glucose levels. She continues to smoke, approximately 0.5-ppd which is a reduction of 1-ppd approximately 6 months ago. Electronic Signature(s) Signed: 11/01/2016 2:16:46 PM By: Bonnell Public Entered By: Bonnell Public on 11/01/2016 14:16:46 [name] ([id number]) -------------------------------------------------------------------------------- Physical Exam Details [name] Date of Service: 11/01/2016 1:30 PM Medical Record Number: [id number] Patient Account Number: [id number] Date of Birth/Sex: 08-29-1945 (70 y.o. Female) Treating RN: Curtis Sites Primary Care Provider: Milon Score Other Clinician: Referring Provider: Treating Provider/Extender: Kathreen Cosier in Treatment: 7 Constitutional BP within normal limits. afebrile. well nourished; well developed; appears stated age;Marland Kitchen Respiratory non-labored respiratory effort. Cardiovascular RLE- palpable DP and PT; palpable  popliteal. Musculoskeletal ambulated without assistance; steady gait. Integumentary (Hair, Skin) RLE ulcer- red granulation tissue throughout, small amount of undermining/separation from 7-12 o'clock, no periwound erythema/fluctuance/induration, no malodor. Psychiatric appears to make sound judgement and have accurate insight regarding healthcare. oriented to time, place, person and situation. calm, pleasant, conversive. Electronic Signature(s) Signed: 11/01/2016 2:18:03 PM By: Bonnell Public  Entered By: Bonnell Public on 11/01/2016 14:18:02 [name] ([id number]) -------------------------------------------------------------------------------- Physician Orders Details [name] Date of Service: 11/01/2016 1:30 PM Medical Record Number: [id number] Patient Account Number: [id number] Date of Birth/Sex: 10-Dec-1945 (70 y.o. Female) Treating RN: Curtis Sites Primary Care Provider: Milon Score Other Clinician: Referring Provider: Treating Provider/Extender: Kathreen Cosier in Treatment: 7 Verbal / Phone Orders: No Diagnosis Coding Wound Cleansing Wound #1 Right,Proximal,Anterior Lower Leg o Clean wound with Normal Saline. o May Shower, gently pat wound dry prior to applying new dressing. Anesthetic Wound #1 Right,Proximal,Anterior Lower Leg o Topical Lidocaine 4% cream applied to wound bed prior to debridement Skin Barriers/Peri-Wound Care Wound #1 Right,Proximal,Anterior Lower Leg o Skin Prep Primary Wound Dressing Wound #1 Right,Proximal,Anterior Lower Leg o Promogran - in undermining area Dressing Change Frequency Wound #1 Right,Proximal,Anterior Lower Leg o Change dressing every week Follow-up Appointments Wound #1 Right,Proximal,Anterior Lower Leg o Return Appointment in 1 week. o Nurse Visit as needed Edema Control Wound #1 Right,Proximal,Anterior Lower Leg o Elevate legs to the level of the heart and pump ankles as often  as possible Additional Orders / Instructions Wound #1 Right,Proximal,Anterior Lower Leg o Stop Smoking o Increase protein intake. o Other: - Please try to keep blood sugars below 180 Wale, Arlette C. ([id number]) Negative Pressure Wound Therapy Wound #1 Right,Proximal,Anterior Lower Leg o Other: - SNAP Vac continuous at 125 mmHg Electronic Signature(s) Signed: 11/01/2016 4:20:28 PM By: Bonnell Public Signed: 11/04/2016 4:37:28 PM By: Curtis Sites Previous Signature: 11/01/2016 2:28:52 PM Version By: Bonnell Public Entered By: Curtis Sites on 11/01/2016 14:31:55 [name] ([id number]) -------------------------------------------------------------------------------- Problem List Details [name] Date of Service: 11/01/2016 1:30 PM Medical Record Number: [id number] Patient Account Number: [id number] Date of Birth/Sex: January 05, 1946 (70 y.o. 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 Female) Treating RN: Curtis Sites Primary Care Provider: Milon Score Other Clinician: Referring Provider: Treating Provider/Extender: Kathreen Cosier in Treatment: 7 Subjective Chief Complaint Information obtained from Patient patient is here for follow-up evaluation of a right lower extremity ulcer History of Present Illness (HPI) The following HPI elements were documented for the patient's wound: Location: right lower extremity in the area of the lateral calf below the knee Quality: Patient reports experiencing a sharp pain to affected area(s). Severity: Patient states wound are getting worse. Duration: Patient has had the wound for < 4 weeks prior to presenting for treatment Timing: Pain in wound is constant (hurts all the time) Context: The wound occurred when the patient a blunt injury and fall Modifying Factors: Other treatment(s) tried include:courses of doxycycline and local Bactroban ointment Associated Signs and Symptoms: Patient reports having increase discharge. 71 year old patient has been referred to as with right lower extremity  wound with edema and ecchymosis, after having a injury due to a fall on 08/20/2016. she has a history of diabetes mellitus type 2 which is uncomplicated, chronic back pain, COPD, hypertension, hypothyroidism, supraventricular tachycardia and tobacco abuse. He is status post neck surgery, appendectomy, cholecystectomy, hernia repair. He smokes about half a pack of cigarettes a day for the last 50 years. Recently on doxycycline for 10 days and was given symptomatic treatment for wound. 10/04/2016 -- the patient tolerated the snap vacuum system well and had no significant problems. She continues to smoke and is trying to give it up. 10/11/2016 -- she continues to tolerate the snap like him system fairly well and other than that she has no fresh issues 11/01/16- she is here for follow-up evaluation of her right  lower extremity ulcer. She is tolerating the SNAP negative pressure system. She is voicing no complaints of pain. She states her glucose levels are consistently less than 150 with her evening glucose levels being higher than morning glucose levels. She continues to smoke, approximately 0.5-ppd which is a reduction of 1-ppd approximately 6 months ago. Objective Bias, Alexza C. ([id number]) Constitutional BP within normal limits. afebrile. well nourished; well developed; appears stated age;Marland Kitchen Vitals Time Taken: 1:34 PM, Height: 67 in, Weight: 197 lbs, BMI: 30.9, Temperature: 97.6 F, Pulse: 67 bpm, Respiratory Rate: 18 breaths/min, Blood Pressure: 146/57 mmHg. Respiratory non-labored respiratory effort. Cardiovascular RLE- palpable DP and PT; palpable popliteal. Musculoskeletal ambulated without assistance; steady gait. Psychiatric appears to make sound judgement and have accurate insight regarding healthcare. oriented to time, place, person and situation. calm, pleasant, conversive. Integumentary (Hair, Skin) RLE ulcer- red granulation tissue throughout, small amount of undermining/separation from 7-12 o'clock, no periwound erythema/fluctuance/induration, no malodor. Wound #1 status is Open. Original cause of wound was Trauma. The wound is located on the Right,Proximal,Anterior Lower Leg. The wound measures 2.2cm length x 1.7cm width x 0.2cm depth; 2.937cm^2 area and 0.587cm^3 volume. There is Fat Layer (Subcutaneous Tissue) Exposed exposed. There is no tunneling noted, however, there is undermining starting at 7:00 and ending at 12:00 with a maximum distance of 0.4cm. There is a large amount of serous drainage noted. The wound margin is flat and intact. There is large (67-100%) red granulation within the wound bed. There is a small (1-33%) amount of necrotic tissue within the wound bed including Adherent Slough. The periwound skin appearance exhibited: Scarring, Maceration, Ecchymosis.  The periwound skin appearance did not exhibit: Callus, Crepitus, Excoriation, Induration, Rash, Dry/Scaly, Atrophie Blanche, Cyanosis, Hemosiderin Staining, Mottled, Pallor, Rubor, Erythema. Periwound temperature was noted as No Abnormality. The periwound has tenderness on palpation. Assessment Active Problems ICD-10 E11.622 - Type 2 diabetes mellitus with other skin ulcer M70.861 - Other soft tissue disorders related to use, overuse and pressure, right lower leg L97.212 - Non-pressure chronic ulcer of right calf with fat layer exposed Schoneman, Kailana C. ([id number]) F17.218 - Nicotine dependence, cigarettes, with other nicotine-induced disorders -we discussed smoking cessation. She states her overall goal is for complete cessation. She is down to half a pack versus one pack a day. She was encouraged to decrease her cigarettes by 2 cigarettes each week and if she still needs help to reach out to her PCP for prescriptive assistance Plan Wound Cleansing: Wound #1 Right,Proximal,Anterior Lower Leg: Clean wound with Normal Saline. May Shower, gently pat wound dry prior to applying new dressing. Anesthetic: Wound #1 Right,Proximal,Anterior Lower Leg: Topical Lidocaine 4% cream applied to wound bed prior to debridement Skin Barriers/Peri-Wound Care: Wound #1  Right,Proximal,Anterior Lower Leg: Skin Prep Primary Wound Dressing: Wound #1 Right,Proximal,Anterior Lower Leg: Promogran - in undermining area Dressing Change Frequency: Wound #1 Right,Proximal,Anterior Lower Leg: Change dressing every week Follow-up Appointments: Wound #1 Right,Proximal,Anterior Lower Leg: Return Appointment in 1 week. Nurse Visit as needed Edema Control: Wound #1 Right,Proximal,Anterior Lower Leg: Elevate legs to the level of the heart and pump ankles as often as possible Additional Orders / Instructions: Wound #1 Right,Proximal,Anterior Lower Leg: Stop Smoking Increase protein intake. Other: - Please try to  keep blood sugars below 180 Negative Pressure Wound Therapy: Wound #1 Right,Proximal,Anterior Lower Leg: Other: - SNAP Vac continuous at 125 mmHg Scholle, Mazey C. ([id number]) 1. Silver collagen to undermining, continue with snap vac 2. Decrease smoking with goal for complete cessation 3. Continue with tight glycemic control 4. Follow-up next week Electronic Signature(s) Signed: 11/01/2016 4:21:07 PM By: Bonnell Public Previous Signature: 11/01/2016 2:19:30 PM Version By: Bonnell Public Entered By: Bonnell Public on 11/01/2016 16:21:07 ZAIDE, MCCLENAHAN ([id number]) -------------------------------------------------------------------------------- SuperBill Details [name] Date of Service: 11/01/2016 Medical Record Number: [id number] Patient Account Number: [id number] Date of Birth/Sex: 1946/03/31 (70 y.o. Female) Treating RN: Curtis Sites Primary Care Provider: Milon Score Other Clinician: Referring Provider: Treating Provider/Extender: Kathreen Cosier in Treatment: 7 Diagnosis Coding ICD-10 Codes Code Description E11.622 Type 2 diabetes mellitus with other skin ulcer M70.861 Other soft tissue disorders related to use, overuse and pressure, right lower leg L97.212 Non-pressure chronic ulcer of right calf with fat layer exposed F17.218 Nicotine dependence, cigarettes, with other nicotine-induced disorders Facility Procedures CPT4 Code: 08657846 Description: 99213 - WOUND CARE VISIT-LEV 3 EST PT Modifier: Quantity: 1 Physician Procedures CPT4 Code Description: [id number] 99213 - WC PHYS LEVEL 3 - EST PT ICD-10 Description Diagnosis E11.622 Type 2 diabetes mellitus with other skin ulcer L97.212 Non-pressure chronic ulcer of right calf with fat l F17.218 Nicotine dependence, cigarettes, with  other nicotin Modifier: ayer exposed e-induced disorder Quantity: 1 s Electronic Signature(s) Signed: 11/01/2016 2:22:16 PM By: Curtis Sites Signed: 11/01/2016 2:28:52 PM  By: Bonnell Public Previous Signature: 11/01/2016 2:19:48 PM Version By: Bonnell Public Entered By: Curtis Sites on 11/01/2016 14:22:16

## 2016-11-08 ENCOUNTER — Encounter: Payer: Medicare Other | Admitting: Physician Assistant

## 2016-11-08 DIAGNOSIS — E11622 Type 2 diabetes mellitus with other skin ulcer: Secondary | ICD-10-CM | POA: Diagnosis not present

## 2016-11-10 NOTE — Progress Notes (Signed)
[name], [name] ([id number]) Visit Report for 11/08/2016 Chief Complaint Document Details Patient Name: Janet, [name] Date of Service: 11/08/2016 3:30 PM Medical Record Number: [id number] Patient Account Number: [id number] Date of Birth/Sex: [birth year] (70 y.o. Female) Treating RN: Ashok Cordia, Debi Primary Care Provider: Milon Score Other Clinician: Referring Provider: Treating Provider/Extender: Linwood Dibbles, Abbygale Lapid Weeks in Treatment: 8 Information Obtained from: Patient Chief Complaint patient is here for follow-up evaluation of a right lower extremity ulcer Electronic Signature(s) Signed: 11/08/2016 5:05:38 PM By: Lenda Kelp PA-C Entered By: Lenda Kelp on 11/08/2016 16:03:52 [name], [name] ([id number]) -------------------------------------------------------------------------------- HPI Details [name] Date of Service: 11/08/2016 3:30 PM Medical Record Number: [id number] Patient Account Number: [id number] Date of Birth/Sex: 1946-03-15 (70 y.o. Female) Treating RN: Ashok Cordia, Debi Primary Care Provider: Milon Score Other Clinician: Referring Provider: Treating Provider/Extender: Linwood Dibbles, Gearald Stonebraker Weeks in Treatment: 8 History of Present Illness Location: right lower extremity in the area of the lateral calf below the knee Quality: Patient reports experiencing a sharp pain to affected area(s) but this is not occurring at this point Severity: Patient states wound are getting better but too slowly for her liking Duration: Patient has had the wound for < 4 weeks prior to presenting for treatment Timing: Pain in wound is intermittent and less frequent these days Context: The wound occurred when the patient a blunt injury and fall Modifying Factors: Other treatment(s) tried include:courses of doxycycline and local Bactroban ointment Associated Signs and Symptoms: Patient reports having increase discharge. HPI Description: 71 year old patient has been  referred to as with right lower extremity wound with edema and ecchymosis, after having a injury due to a fall on 08/20/2016. she has a history of diabetes mellitus type 2 which is uncomplicated, chronic back pain, COPD, hypertension, hypothyroidism, supraventricular tachycardia and tobacco abuse. He is status post neck surgery, appendectomy, cholecystectomy, hernia repair. He smokes about half a pack of cigarettes a day for the last 50 years. Recently on doxycycline for 10 days and was given symptomatic treatment for wound. 10/04/2016 -- the patient tolerated the snap vacuum system well and had no significant problems. She continues to smoke and is trying to give it up. 10/11/2016 -- she continues to tolerate the snap like him system fairly well and other than that she has no fresh issues 11/01/16- she is here for follow-up evaluation of her right lower extremity ulcer. She is tolerating the SNAP negative pressure system. She is voicing no complaints of pain. She states her glucose levels are consistently less than 150 with her evening glucose levels being higher than morning glucose levels. She continues to smoke, approximately 0.5-ppd which is a reduction of 1-ppd approximately 6 months ago. 11/08/16 patient on evaluation today fortunately does not appear to be having as much discomfort as she has in the past. She is tolerating the dressing changes well although she does note some irritation where the wound VAC has been applied. She specifically wonders if we could see about taking a break possibly progressing to discontinuation of the wound VAC. There is no evidence of infection. Electronic Signature(s) Signed: 11/08/2016 5:05:38 PM By: Lenda Kelp PA-C Entered By: Lenda Kelp on 11/08/2016 16:06:36 [name], [name] ([id number]) -------------------------------------------------------------------------------- Physical Exam Details [name] Date of Service:  11/08/2016 3:30 PM Medical Record Number: [id number] Patient Account Number: [id number] Date of Birth/Sex: Aug 01, 1945 (70 y.o. Female) Treating RN: Ashok Cordia, Debi Primary Care Provider: Milon Score Other Clinician: Referring Provider: Treating Provider/Extender:  STONE III, Moyinoluwa Dawe Weeks in Treatment: 8 Constitutional Well-nourished and well-hydrated in no acute distress. Respiratory normal breathing without difficulty. clear to auscultation bilaterally. Cardiovascular regular rate and rhythm with normal S1, S2. Psychiatric this patient is able to make decisions and demonstrates good insight into disease process. Alert and Oriented x 3. pleasant and cooperative. Electronic Signature(s) Signed: 11/08/2016 5:05:38 PM By: Lenda Kelp PA-C Entered By: Lenda Kelp on 11/08/2016 16:07:04 [name], [name] ([id number]) -------------------------------------------------------------------------------- Physician Orders Details [name] Date of Service: 11/08/2016 3:30 PM Medical Record Number: [id number] Patient Account Number: [id number] Date of Birth/Sex: 12/07/1945 (70 y.o. Female) Treating RN: Ashok Cordia, Debi Primary Care Provider: Milon Score Other Clinician: Referring Provider: Treating Provider/Extender: Linwood Dibbles, Marisha Renier Weeks in Treatment: 8 Verbal / Phone Orders: Yes Clinician: Ashok Cordia, Debi Read Back and Verified: Yes Diagnosis Coding Wound Cleansing Wound #1 Right,Proximal,Anterior Lower Leg o Clean wound with Normal Saline. o May Shower, gently pat wound dry prior to applying new dressing. Anesthetic Wound #1 Right,Proximal,Anterior Lower Leg o Topical Lidocaine 4% cream applied to wound bed prior to debridement Skin Barriers/Peri-Wound Care Wound #1 Right,Proximal,Anterior Lower Leg o Skin Prep Primary Wound Dressing Wound #1 Right,Proximal,Anterior Lower Leg o Promogran - in undermining area and on wound Dressing Change  Frequency Wound #1 Right,Proximal,Anterior Lower Leg o Change dressing every other day. Follow-up Appointments Wound #1 Right,Proximal,Anterior Lower Leg o Return Appointment in 1 week. o Nurse Visit as needed Edema Control Wound #1 Right,Proximal,Anterior Lower Leg o Elevate legs to the level of the heart and pump ankles as often as possible Additional Orders / Instructions Wound #1 Right,Proximal,Anterior Lower Leg o Stop Smoking o Increase protein intake. o Other: - Please try to keep blood sugars below 180 Mcandrew, Kolbie C. ([id number]) Negative Pressure Wound Therapy Wound #1 Right,Proximal,Anterior Lower Leg o Place NPWT on HOLD. o Other: - SNAP Vac continuous at 125 mmHg Electronic Signature(s) Signed: 11/08/2016 4:51:20 PM By: Alejandro Mulling Signed: 11/08/2016 5:05:38 PM By: Lenda Kelp PA-C Entered By: Alejandro Mulling on 11/08/2016 15:58:22 ALIVIYA, SCHOELLER ([id number]) -------------------------------------------------------------------------------- Problem List Details [name] Date of Service: 11/08/2016 3:30 PM Medical Record Number: [id number] Patient Account Number: [id number] Date of Birth/Sex: 1946/04/27 (71 y.o. 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 Female) Treating RN: Ashok Cordia, Debi Primary Care Provider: Milon Score Other Clinician: Referring Provider: Treating Provider/Extender: Linwood Dibbles, Soloman Mckeithan Weeks in Treatment: 8 Subjective Chief Complaint Information obtained from Patient patient is here for follow-up evaluation of a right lower extremity ulcer History of Present Illness (HPI) The following HPI elements were documented for the patient's wound: Location: right lower extremity in the area of the lateral calf below the knee Quality: Patient reports experiencing  a sharp pain to affected area(s) but this is not occurring at this point Severity: Patient states wound are getting better but too slowly for her liking Duration: Patient has had the wound for < 4 weeks prior to presenting for treatment Timing: Pain in wound is intermittent and less frequent these days Context: The wound occurred when the patient a blunt injury and fall Modifying Factors: Other treatment(s) tried include:courses of doxycycline and local Bactroban ointment Associated Signs and Symptoms: Patient reports having increase discharge. 71 year old patient has been referred to as with right lower extremity wound with edema and ecchymosis, after having a injury due to a fall on 08/20/2016. she has a history of diabetes mellitus type 2 which is uncomplicated, chronic back pain, COPD, hypertension, hypothyroidism, supraventricular tachycardia and tobacco abuse. He is status post neck surgery, appendectomy, cholecystectomy, hernia repair. He smokes about half a pack of cigarettes a day for the last 50 years.  Recently on doxycycline for 10 days and was given symptomatic treatment for wound. 10/04/2016 -- the patient tolerated the snap vacuum system well and had no significant problems. She continues to smoke and is trying to give it up. 10/11/2016 -- she continues to tolerate the snap like him system fairly well and other than that she has no fresh issues 11/01/16- she is here for follow-up evaluation of her right lower extremity ulcer. She is tolerating the SNAP negative pressure system. She is voicing no complaints of pain. She states her glucose levels are consistently less than 150 with her evening glucose levels being higher than morning glucose levels. She continues to smoke, approximately 0.5-ppd which is a reduction of 1-ppd approximately 6 months ago. 11/08/16 patient on evaluation today fortunately does not appear to be having as much discomfort as she has in the past. She is tolerating the dressing changes well although she does note some irritation where the wound VAC has been applied. She specifically wonders if we could see about taking a break possibly progressing to discontinuation of the wound VAC. There is no evidence of infection. IDELLE, REIMANN C. ([id number]) Objective Constitutional Well-nourished and well-hydrated in no acute distress. Vitals Time Taken: 3:30 PM, Height: 67 in, Weight: 197 lbs, BMI: 30.9, Temperature: 97.7 F, Pulse: 79 bpm, Respiratory Rate: 18 breaths/min, Blood Pressure: 144/69 mmHg. Respiratory normal breathing without difficulty. clear to auscultation bilaterally. Cardiovascular regular rate and rhythm with normal S1, S2. Psychiatric this patient is able to make decisions and demonstrates good insight into disease process. Alert and Oriented x 3. pleasant and cooperative. Integumentary (Hair, Skin) Wound #1 status is Open. Original cause of wound was Trauma. The wound is located on the Right,Proximal,Anterior Lower Leg. The wound measures 1.8cm length  x 1.5cm width x 0.3cm depth; 2.121cm^2 area and 0.636cm^3 volume. There is Fat Layer (Subcutaneous Tissue) Exposed exposed. There is no tunneling noted, however, there is undermining starting at 8:00 and ending at 11:00 with a maximum distance of 0.5cm. There is a large amount of serous drainage noted. The wound margin is flat and intact. There is large (67-100%) red granulation within the wound bed. There is a small (1-33%) amount of necrotic tissue within the wound bed including Adherent Slough. The periwound skin appearance exhibited: Scarring, Maceration, Ecchymosis. The periwound skin appearance did not exhibit: Callus, Crepitus, Excoriation, Induration, Rash, Dry/Scaly, Atrophie Blanche, Cyanosis, Hemosiderin Staining, Mottled, Pallor, Rubor, Erythema. Periwound temperature was noted as No Abnormality. The periwound has tenderness on palpation. Assessment Active Problems ICD-10 E11.622 - Type 2 diabetes  mellitus with other skin ulcer M70.861 - Other soft tissue disorders related to use, overuse and pressure, right lower leg L97.212 - Non-pressure chronic ulcer of right calf with fat layer exposed F17.218 - Nicotine dependence, cigarettes, with other nicotine-induced disorders Horsch, Henretta C. ([id number]) Plan Wound Cleansing: Wound #1 Right,Proximal,Anterior Lower Leg: Clean wound with Normal Saline. May Shower, gently pat wound dry prior to applying new dressing. Anesthetic: Wound #1 Right,Proximal,Anterior Lower Leg: Topical Lidocaine 4% cream applied to wound bed prior to debridement Skin Barriers/Peri-Wound Care: Wound #1 Right,Proximal,Anterior Lower Leg: Skin Prep Primary Wound Dressing: Wound #1 Right,Proximal,Anterior Lower Leg: Promogran - in undermining area and on wound Dressing Change Frequency: Wound #1 Right,Proximal,Anterior Lower Leg: Change dressing every other day. Follow-up Appointments: Wound #1 Right,Proximal,Anterior Lower Leg: Return Appointment in 1  week. Nurse Visit as needed Edema Control: Wound #1 Right,Proximal,Anterior Lower Leg: Elevate legs to the level of the heart and pump ankles as often as possible Additional Orders / Instructions: Wound #1 Right,Proximal,Anterior Lower Leg: Stop Smoking Increase protein intake. Other: - Please try to keep blood sugars below 180 Negative Pressure Wound Therapy: Wound #1 Right,Proximal,Anterior Lower Leg: Place NPWT on HOLD. Other: - SNAP Vac continuous at 125 mmHg Patient's wound appears to be progressing very nicely in my opinion. She does have some irritation surrounding the wound VAC Peri-wound region. After discussion with patient we are going to hold the wound VAC for one week. In the interim we will utilize a collagen dressing and see if some of the Turpen, Avea C. ([id number]) undermining will reattach. I really do not believe this will cause any detrimental effects as far as the wound is concerned. She is very excited and happy about the plan as again the wound VAC has been giving her trouble. We will see her for reevaluation in one week and make a decision on complete cessation versus reinitiation of the vac at that point. In the interim if anything worsened significantly she will contact our office for further recommendations. Electronic Signature(s) Signed: 11/08/2016 5:05:38 PM By: Lenda Kelp PA-C Entered By: Lenda Kelp on 11/08/2016 16:10:06 [name] ([id number]) -------------------------------------------------------------------------------- SuperBill Details [name] Date of Service: 11/08/2016 Medical Record Number: [id number] Patient Account Number: [id number] Date of Birth/Sex: [birth year] (70 y.o. Female) Treating RN: Ashok Cordia, Debi Primary Care Provider: Milon Score Other Clinician: Referring Provider: Treating Provider/Extender: Linwood Dibbles, Beaulah Romanek Weeks in Treatment: 8 Diagnosis Coding ICD-10 Codes Code Description E11.622  Type 2 diabetes mellitus with other skin ulcer M70.861 Other soft tissue disorders related to use, overuse and pressure, right lower leg L97.212 Non-pressure chronic ulcer of right calf with fat layer exposed F17.218 Nicotine dependence, cigarettes, with other nicotine-induced disorders Facility Procedures CPT4 Code: 41324401 Description: 99213 - WOUND CARE VISIT-LEV 3 EST PT Modifier: Quantity: 1 Physician Procedures CPT4: Description Modifier Quantity Code [id number] 99213 - WC PHYS LEVEL 3 - EST PT 1 ICD-10 Description Diagnosis E11.622 Type 2 diabetes mellitus with other skin ulcer M70.861 Other soft tissue disorders related to use, overuse and pressure, right lower  leg L97.212 Non-pressure chronic ulcer of right calf with fat layer exposed F17.218 Nicotine dependence, cigarettes, with other nicotine-induced disorders Electronic Signature(s) Signed: 11/08/2016 4:51:20 PM By: Alejandro Mulling Signed: 11/08/2016 5:05:38 PM By: Lenda Kelp PA-C Entered By: Alejandro Mulling on 11/08/2016 16:50:59

## 2016-11-10 NOTE — Progress Notes (Signed)
[name], [name] ([id number]) Visit Report for 11/08/2016 Arrival Information Details [name] Date of Service: 11/08/2016 3:30 PM Medical Record Number: [id number] Patient Account Number: [id number] Date of Birth/Sex: 04/10/1946 (70 y.o. Female) Treating RN: Ashok Cordia, Debi Primary Care Christl Fessenden: Milon Score Other Clinician: Referring Taraya Steward: Treating Totiana Everson/Extender: Linwood Dibbles, HOYT Weeks in Treatment: 8 Visit Information History Since Last Visit All ordered tests and consults were completed: No Patient Arrived: Ambulatory Added or deleted any medications: No Arrival Time: 15:29 Any new allergies or adverse reactions: No Accompanied By: son Had a fall or experienced change in No Transfer Assistance: None activities of daily living that may affect Patient Identification Verified: Yes risk of falls: Secondary Verification Process Yes Signs or symptoms of abuse/neglect since last No Completed: visito Patient Requires Transmission-Based No Hospitalized since last visit: No Precautions: Has Dressing in Place as Prescribed: Yes Patient Has Alerts: Yes Pain Present Now: No Patient Alerts: DMII Electronic Signature(s) Signed: 11/08/2016 4:51:20 PM By: Alejandro Mulling Entered By: Alejandro Mulling on 11/08/2016 15:30:13 [name] ([id number]) -------------------------------------------------------------------------------- Clinic Level of Care Assessment Details [name] Date of Service: 11/08/2016 3:30 PM Medical Record Number: [id number] Patient Account Number: [id number] Date of Birth/Sex: 06/27/1946 (70 y.o. Female) Treating RN: Ashok Cordia, Debi Primary Care Rajeev Escue: Milon Score Other Clinician: Referring Lashonta Pilling: Treating Michaeljoseph Revolorio/Extender: Linwood Dibbles, HOYT Weeks in Treatment: 8 Clinic Level of Care Assessment Items TOOL 4 Quantity Score X - Use when only an EandM is performed on FOLLOW-UP visit 1 0 ASSESSMENTS -  Nursing Assessment / Reassessment X - Reassessment of Co-morbidities (includes updates in patient status) 1 10 X - Reassessment of Adherence to Treatment Plan 1 5 ASSESSMENTS - Wound and Skin Assessment / Reassessment X - Simple Wound Assessment / Reassessment - one wound 1 5  - Complex Wound Assessment / Reassessment - multiple wounds 0  - Dermatologic / Skin Assessment (not related to wound area) 0 ASSESSMENTS - Focused Assessment  - Circumferential Edema Measurements - multi extremities 0  - Nutritional Assessment / Counseling / Intervention 0  - Lower Extremity Assessment (monofilament, tuning fork, pulses) 0  - Peripheral Arterial Disease Assessment (using hand held doppler) 0 ASSESSMENTS - Ostomy and/or Continence Assessment and Care  - Incontinence Assessment and Management 0  - Ostomy Care Assessment and Management (repouching, etc.) 0 PROCESS - Coordination of Care  - Simple Patient / Family Education for ongoing care 0 X - Complex (extensive) Patient / Family Education for ongoing care 1 20  - Staff obtains Chiropractor, Records, Test Results / Process Orders 0  - Staff telephones HHA, Nursing Homes / Clarify orders / etc 0  - Routine Transfer to another Facility (non-emergent condition) 0 Koral, Magnolia C. ([id number])  - Routine Hospital Admission (non-emergent condition) 0  - New Admissions / Manufacturing engineer / Ordering NPWT, Apligraf, etc. 0  - Emergency Hospital Admission (emergent condition) 0 X - Simple Discharge Coordination 1 10  - Complex (extensive) Discharge Coordination 0 PROCESS - Special Needs  - Pediatric / Minor Patient Management 0  - Isolation Patient Management 0  - Hearing / Language / Visual special needs 0  - Assessment of Community assistance (transportation, D/C planning, etc.) 0  - Additional assistance / Altered mentation 0  - Support Surface(s) Assessment (bed, cushion, seat, etc.) 0 INTERVENTIONS -  Wound Cleansing / Measurement X - Simple Wound Cleansing - one wound 1 5  - Complex Wound Cleansing - multiple wounds 0 X - Wound Imaging (photographs -  any number of wounds) 1 5  - Wound Tracing (instead of photographs) 0 X - Simple Wound Measurement - one wound 1 5  - Complex Wound Measurement - multiple wounds 0 INTERVENTIONS - Wound Dressings X - Small Wound Dressing one or multiple wounds 1 10  - Medium Wound Dressing one or multiple wounds 0  - Large Wound Dressing one or multiple wounds 0 X - Application of Medications - topical 1 5  - Application of Medications - injection 0 INTERVENTIONS - Miscellaneous  - External ear exam 0 [name], [name] ([id number])  - Specimen Collection (cultures, biopsies, blood, body fluids, etc.) 0  - Specimen(s) / Culture(s) sent or taken to Lab for analysis 0  - Patient Transfer (multiple staff / Michiel Sites Lift / Similar devices) 0  - Simple Staple / Suture removal (25 or less) 0  - Complex Staple / Suture removal (26 or more) 0  - Hypo / Hyperglycemic Management (close monitor of Blood Glucose) 0  - Ankle / Brachial Index (ABI) - do not check if billed separately 0 X - Vital Signs 1 5 Has the patient been seen at the hospital within the last three years: Yes Total Score: 85 Level Of Care: New/Established - Level 3 Electronic Signature(s) Signed: 11/08/2016 4:51:20 PM By: Alejandro Mulling Entered By: Alejandro Mulling on 11/08/2016 16:50:53 [name] ([id number]) -------------------------------------------------------------------------------- Encounter Discharge Information Details [name] Date of Service: 11/08/2016 3:30 PM Medical Record Number: [id number] Patient Account Number: [id number] Date of Birth/Sex: 23-Jan-1946 (70 y.o. Female) Treating RN: Ashok Cordia, Debi Primary Care Jadesola Poynter: Milon Score Other Clinician: Referring Azarias Chiou: Treating Marah Park/Extender: Linwood Dibbles, HOYT Weeks in  Treatment: 8 Encounter Discharge Information Items Discharge Pain Level: 0 Discharge Condition: Stable Ambulatory Status: Ambulatory Discharge Destination: Home Transportation: Private Auto Accompanied By: son Schedule Follow-up Appointment: Yes Medication Reconciliation completed and provided to Patient/Care No Calisa Luckenbaugh: Provided on Clinical Summary of Care: 11/08/2016 Form Type Recipient Paper Patient ML Electronic Signature(s) Signed: 11/08/2016 4:07:24 PM By: Gwenlyn Perking Entered By: Gwenlyn Perking on 11/08/2016 16:07:24 [name] ([id number]) -------------------------------------------------------------------------------- Lower Extremity Assessment Details [name] Date of Service: 11/08/2016 3:30 PM Medical Record Number: [id number] Patient Account Number: [id number] Date of Birth/Sex: 05/26/1946 (70 y.o. Female) Treating RN: Ashok Cordia, Debi Primary Care Merla Sawka: Milon Score Other Clinician: Referring Soraya Paquette: Treating Sacha Topor/Extender: Linwood Dibbles, HOYT Weeks in Treatment: 8 Electronic Signature(s) Signed: 11/08/2016 4:51:20 PM By: Alejandro Mulling Entered By: Alejandro Mulling on 11/08/2016 15:39:34 [name], [name] ([id number]) -------------------------------------------------------------------------------- Multi Wound Chart Details [name] Date of Service: 11/08/2016 3:30 PM Medical Record Number: [id number] Patient Account Number: [id number] Date of Birth/Sex: January 08, 1946 (70 y.o. Female) Treating RN: Ashok Cordia, Debi Primary Care Reianna Batdorf: Milon Score Other Clinician: Referring Jolaine Fryberger: Treating Lanson Randle/Extender: Linwood Dibbles, HOYT Weeks in Treatment: 8 Vital Signs Height(in): 67 Pulse(bpm): 79 Weight(lbs): 197 Blood Pressure 144/69 (mmHg): Body Mass Index(BMI): 31 Temperature(F): 97.7 Respiratory Rate 18 (breaths/min): Photos: [1:No Photos] [N/A:N/A] Wound Location: [1:Right Lower Leg - Anterior,  Proximal] [N/A:N/A] Wounding Event: [1:Trauma] [N/A:N/A] Primary Etiology: [1:Trauma, Other] [N/A:N/A] Comorbid History: [1:Chronic Obstructive Pulmonary Disease (COPD), Hypertension, Type II Diabetes] [N/A:N/A] Date Acquired: [1:08/17/2016] [N/A:N/A] Weeks of Treatment: [1:8] [N/A:N/A] Wound Status: [1:Open] [N/A:N/A] Measurements L x W x D 1.8x1.5x0.3 [N/A:N/A] (cm) Area (cm) : [1:2.121] [N/A:N/A] Volume (cm) : [1:0.636] [N/A:N/A] % Reduction in Area: [1:41.90%] [N/A:N/A] % Reduction in Volume: -74.20% [N/A:N/A] Starting Position 1 8 (o'clock): Ending Position 1 [1:11] (o'clock): Maximum Distance 1 0.5 (cm): Undermining: [  1:Yes] [N/A:N/A] Classification: [1:Full Thickness Without Exposed Support Structures] [N/A:N/A] HBO Classification: [1:Grade 1] [N/A:N/A] Exudate Amount: [1:Large] [N/A:N/A] Exudate Type: Serous N/A N/A Exudate Color: amber N/A N/A Wound Margin: Flat and Intact N/A N/A Granulation Amount: Large (67-100%) N/A N/A Granulation Quality: Red N/A N/A Necrotic Amount: Small (1-33%) N/A N/A Exposed Structures: Fat Layer (Subcutaneous N/A N/A Tissue) Exposed: Yes Fascia: No Tendon: No Muscle: No Joint: No Bone: No Epithelialization: Small (1-33%) N/A N/A Periwound Skin Texture: Scarring: Yes N/A N/A Excoriation: No Induration: No Callus: No Crepitus: No Rash: No Periwound Skin Maceration: Yes N/A N/A Moisture: Dry/Scaly: No Periwound Skin Color: Ecchymosis: Yes N/A N/A Atrophie Blanche: No Cyanosis: No Erythema: No Hemosiderin Staining: No Mottled: No Pallor: No Rubor: No Temperature: No Abnormality N/A N/A Tenderness on Yes N/A N/A Palpation: Wound Preparation: Ulcer Cleansing: N/A N/A Rinsed/Irrigated with Saline Topical Anesthetic Applied: Other: lidocaine 4% Treatment Notes Electronic Signature(s) Signed: 11/08/2016 4:51:20 PM By: Alejandro Mulling Entered By: Alejandro Mulling on 11/08/2016 15:39:53 [name], [name]  ([id number]) -------------------------------------------------------------------------------- Multi-Disciplinary Care Plan Details [name] Date of Service: 11/08/2016 3:30 PM Medical Record Number: [id number] Patient Account Number: [id number] Date of Birth/Sex: [birth year] (70 y.o. Female) Treating RN: Ashok Cordia, Debi Primary Care Chi Garlow: Milon Score Other Clinician: Referring Maydell Knoebel: Treating Nickey Kloepfer/Extender: Linwood Dibbles, HOYT Weeks in Treatment: 8 Active Inactive ` Necrotic Tissue Nursing Diagnoses: Impaired tissue integrity related to necrotic/devitalized tissue Goals: Necrotic/devitalized tissue will be minimized in the wound bed Date Initiated: 09/13/2016 Target Resolution Date: 10/18/2016 Goal Status: Active Interventions: Assess patient pain level pre-, during and post procedure and prior to discharge Notes: ` Orientation to the Wound Care Program Nursing Diagnoses: Knowledge deficit related to the wound healing center program Goals: Patient/caregiver will verbalize understanding of the Wound Healing Center Program Date Initiated: 09/13/2016 Target Resolution Date: 12/20/2016 Goal Status: Active Interventions: Provide education on orientation to the wound center Notes: ` Wound/Skin Impairment Nursing Diagnoses: Impaired tissue integrity [name], [name] ([id number]) Goals: Patient/caregiver will verbalize understanding of skin care regimen Date Initiated: 09/13/2016 Target Resolution Date: 12/20/2016 Goal Status: Active Ulcer/skin breakdown will have a volume reduction of 30% by week 4 Date Initiated: 09/13/2016 Target Resolution Date: 12/20/2016 Goal Status: Active Ulcer/skin breakdown will have a volume reduction of 50% by week 8 Date Initiated: 09/13/2016 Target Resolution Date: 12/20/2016 Goal Status: Active Ulcer/skin breakdown will have a volume reduction of 80% by week 12 Date Initiated: 09/13/2016 Target Resolution Date: 12/20/2016 Goal Status:  Active Ulcer/skin breakdown will heal within 14 weeks Date Initiated: 09/13/2016 Target Resolution Date: 12/20/2016 Goal Status: Active Interventions: Assess patient/caregiver ability to obtain necessary supplies Assess patient/caregiver ability to perform ulcer/skin care regimen upon admission and as needed Assess ulceration(s) every visit Notes: Electronic Signature(s) Signed: 11/08/2016 4:51:20 PM By: Alejandro Mulling Entered By: Alejandro Mulling on 11/08/2016 15:39:47 [name] ([id number]) -------------------------------------------------------------------------------- Pain Assessment Details [name] Date of Service: 11/08/2016 3:30 PM Medical Record Number: [id number] Patient Account Number: [id number] Date of Birth/Sex: 25-Oct-1945 (70 y.o. Female) Treating RN: Ashok Cordia, Debi Primary Care Makiah Clauson: Milon Score Other Clinician: Referring Kaylum Shrum: Treating Christan Ciccarelli/Extender: Linwood Dibbles, HOYT Weeks in Treatment: 8 Active Problems Location of Pain Severity and Description of Pain Patient Has Paino No Site Locations With Dressing Change: No Pain Management and Medication Current Pain Management: Electronic Signature(s) Signed: 11/08/2016 4:51:20 PM By: Alejandro Mulling Entered By: Alejandro Mulling on 11/08/2016 15:30:25 [name] ([id number]) -------------------------------------------------------------------------------- Patient/Caregiver Education Details [name] Date of Service: 11/08/2016  3:30 PM Medical Record Number: [id number] Patient Account Number: [id number] Date of Birth/Gender: 1946-06-07 (71 y.o. Female) Treating RN: Ashok Cordia, Debi Primary Care Physician: Milon Score Other Clinician: Referring Physician: Treating Physician/Extender: Skeet Simmer in Treatment: 8 Education Assessment Education Provided To: Patient Education Topics Provided Wound/Skin Impairment: Handouts: Other: change  dressing as ordered Methods: Demonstration, Explain/Verbal Responses: State content correctly Electronic Signature(s) Signed: 11/08/2016 4:51:20 PM By: Alejandro Mulling Entered By: Alejandro Mulling on 11/08/2016 15:56:47 [name] ([id number]) -------------------------------------------------------------------------------- Wound Assessment Details [name] Date of Service: 11/08/2016 3:30 PM Medical Record Number: [id number] Patient Account Number: [id number] Date of Birth/Sex: [birth year] (70 y.o. Female) Treating RN: Ashok Cordia, Debi Primary Care Suhaila Troiano: Milon Score Other Clinician: Referring Indiana Gamero: Treating Cheyla Duchemin/Extender: Linwood Dibbles, HOYT Weeks in Treatment: 8 Wound Status Wound Number: 1 Primary Trauma, Other Etiology: Wound Location: Right Lower Leg - Anterior, Proximal Wound Open Status: Wounding Event: Trauma Comorbid Chronic Obstructive Pulmonary Date Acquired: 08/17/2016 History: Disease (COPD), Hypertension, Type Weeks Of Treatment: 8 II Diabetes Clustered Wound: No Photos Photo Uploaded By: Alejandro Mulling on 11/08/2016 16:18:06 Wound Measurements Length: (cm) 1.8 Width: (cm) 1.5 Depth: (cm) 0.3 Area: (cm) 2.121 Volume: (cm) 0.636 % Reduction in Area: 41.9% % Reduction in Volume: -74.2% Epithelialization: Small (1-33%) Tunneling: No Undermining: Yes Starting Position (o'clock): 8 Ending Position (o'clock): 11 Maximum Distance: (cm) 0.5 Wound Description Full Thickness Without Classification: Exposed Support Structures Diabetic Severity Grade 1 (Wagner): Wound Margin: Flat and Intact Exudate Amount: Large [name], [name] ([id number]) Foul Odor After Cleansing: No Slough/Fibrino No Exudate Type: Serous Exudate Color: amber Wound Bed Granulation Amount: Large (67-100%) Exposed Structure Granulation Quality: Red Fascia Exposed: No Necrotic Amount: Small (1-33%) Fat Layer (Subcutaneous Tissue) Exposed:  Yes Necrotic Quality: Adherent Slough Tendon Exposed: No Muscle Exposed: No Joint Exposed: No Bone Exposed: No Periwound Skin Texture Texture Color No Abnormalities Noted: No No Abnormalities Noted: No Callus: No Atrophie Blanche: No Crepitus: No Cyanosis: No Excoriation: No Ecchymosis: Yes Induration: No Erythema: No Rash: No Hemosiderin Staining: No Scarring: Yes Mottled: No Pallor: No Moisture Rubor: No No Abnormalities Noted: No Dry / Scaly: No Temperature / Pain Maceration: Yes Temperature: No Abnormality Tenderness on Palpation: Yes Wound Preparation Ulcer Cleansing: Rinsed/Irrigated with Saline Topical Anesthetic Applied: Other: lidocaine 4%, Treatment Notes Wound #1 (Right, Proximal, Anterior Lower Leg) 1. Cleansed with: Clean wound with Normal Saline 2. Anesthetic Topical Lidocaine 4% cream to wound bed prior to debridement 3. Peri-wound Care: Skin Prep 4. Dressing Applied: Promogran 5. Secondary Dressing Applied Bordered Foam Dressing Dry Gauze Electronic Signature(s) Signed: 11/08/2016 4:51:20 PM By: Dutch Quint, Pincus Sanes ([id number]) Entered By: Alejandro Mulling on 11/08/2016 15:38:50 [name], [name] ([id number]) -------------------------------------------------------------------------------- Vitals Details [name] Date of Service: 11/08/2016 3:30 PM Medical Record Number: [id number] Patient Account Number: [id number] Date of Birth/Sex: May 02, 1946 (70 y.o. Female) Treating RN: Ashok Cordia, Debi Primary Care Barrett Goldie: Milon Score Other Clinician: Referring Toma Arts: Treating Joh Rao/Extender: Linwood Dibbles, HOYT Weeks in Treatment: 8 Vital Signs Time Taken: 15:30 Temperature (F): 97.7 Height (in): 67 Pulse (bpm): 79 Weight (lbs): 197 Respiratory Rate (breaths/min): 18 Body Mass Index (BMI): 30.9 Blood Pressure (mmHg): 144/69 Reference Range: 80 - 120 mg / dl Electronic Signature(s) Signed: 11/08/2016  4:51:20 PM By: Alejandro Mulling Entered By: Alejandro Mulling on 11/08/2016 15:32:14

## 2016-11-15 ENCOUNTER — Encounter: Payer: Medicare Other | Attending: Surgery | Admitting: Surgery

## 2016-11-15 DIAGNOSIS — E039 Hypothyroidism, unspecified: Secondary | ICD-10-CM | POA: Diagnosis not present

## 2016-11-15 DIAGNOSIS — F17218 Nicotine dependence, cigarettes, with other nicotine-induced disorders: Secondary | ICD-10-CM | POA: Diagnosis not present

## 2016-11-15 DIAGNOSIS — I10 Essential (primary) hypertension: Secondary | ICD-10-CM | POA: Diagnosis not present

## 2016-11-15 DIAGNOSIS — Z79899 Other long term (current) drug therapy: Secondary | ICD-10-CM | POA: Diagnosis not present

## 2016-11-15 DIAGNOSIS — L97212 Non-pressure chronic ulcer of right calf with fat layer exposed: Secondary | ICD-10-CM | POA: Diagnosis not present

## 2016-11-15 DIAGNOSIS — Z881 Allergy status to other antibiotic agents status: Secondary | ICD-10-CM | POA: Diagnosis not present

## 2016-11-15 DIAGNOSIS — Z886 Allergy status to analgesic agent status: Secondary | ICD-10-CM | POA: Diagnosis not present

## 2016-11-15 DIAGNOSIS — G8929 Other chronic pain: Secondary | ICD-10-CM | POA: Insufficient documentation

## 2016-11-15 DIAGNOSIS — Z7982 Long term (current) use of aspirin: Secondary | ICD-10-CM | POA: Insufficient documentation

## 2016-11-15 DIAGNOSIS — Z7984 Long term (current) use of oral hypoglycemic drugs: Secondary | ICD-10-CM | POA: Diagnosis not present

## 2016-11-15 DIAGNOSIS — E11622 Type 2 diabetes mellitus with other skin ulcer: Secondary | ICD-10-CM | POA: Insufficient documentation

## 2016-11-15 DIAGNOSIS — J449 Chronic obstructive pulmonary disease, unspecified: Secondary | ICD-10-CM | POA: Insufficient documentation

## 2016-11-15 DIAGNOSIS — M70861 Other soft tissue disorders related to use, overuse and pressure, right lower leg: Secondary | ICD-10-CM | POA: Diagnosis not present

## 2016-11-15 DIAGNOSIS — I471 Supraventricular tachycardia: Secondary | ICD-10-CM | POA: Diagnosis not present

## 2016-11-15 DIAGNOSIS — M545 Low back pain: Secondary | ICD-10-CM | POA: Insufficient documentation

## 2016-11-17 NOTE — Progress Notes (Signed)
[name] ([id number]) Visit Report for 11/15/2016 Chief Complaint Document Details [name], [name] Date of Service: 11/15/2016 3:30 PM Medical Record Number: [id number] Patient Account Number: [id number] Date of Birth/Sex: May 02, 1946 (70 y.o. Female) Treating RN: Huel Coventry Primary Care Provider: Milon Score Other Clinician: Referring Provider: Treating Provider/Extender: Rudene Re in Treatment: 9 Information Obtained from: Patient Chief Complaint patient is here for follow-up evaluation of a right lower extremity ulcer Electronic Signature(s) Signed: 11/15/2016 4:22:36 PM By: Evlyn Kanner MD, FACS Entered By: Evlyn Kanner on 11/15/2016 16:22:36 [name] ([id number]) -------------------------------------------------------------------------------- HPI Details [name] Date of Service: 11/15/2016 3:30 PM Medical Record Number: [id number] Patient Account Number: [id number] Date of Birth/Sex: [birth year] (71 y.o. Female) Treating RN: Huel Coventry Primary Care Provider: Milon Score Other Clinician: Referring Provider: Treating Provider/Extender: Rudene Re in Treatment: 9 History of Present Illness Location: right lower extremity in the area of the lateral calf below the knee Quality: Patient reports experiencing a sharp pain to affected area(s) but this is not occurring at this point Severity: Patient states wound are getting better but too slowly for her liking Duration: Patient has had the wound for < 4 weeks prior to presenting for treatment Timing: Pain in wound is intermittent and less frequent these days Context: The wound occurred when the patient a blunt injury and fall Modifying Factors: Other treatment(s) tried include:courses of doxycycline and local Bactroban ointment Associated Signs and Symptoms: Patient reports having increase discharge. HPI Description: 71 year old patient has been referred to as with  right lower extremity wound with edema and ecchymosis, after having a injury due to a fall on 08/20/2016. she has a history of diabetes mellitus type 2 which is uncomplicated, chronic back pain, COPD, hypertension, hypothyroidism, supraventricular tachycardia and tobacco abuse. He is status post neck surgery, appendectomy, cholecystectomy, hernia repair. He smokes about half a pack of cigarettes a day for the last 50 years. Recently on doxycycline for 10 days and was given symptomatic treatment for wound. 10/04/2016 -- the patient tolerated the snap vacuum system well and had no significant problems. She continues to smoke and is trying to give it up. 10/11/2016 -- she continues to tolerate the snap like him system fairly well and other than that she has no fresh issues 11/01/16- she is here for follow-up evaluation of her right lower extremity ulcer. She is tolerating the SNAP negative pressure system. She is voicing no complaints of pain. She states her glucose levels are consistently less than 150 with her evening glucose levels being higher than morning glucose levels. She continues to smoke, approximately 0.5-ppd which is a reduction of 1-ppd approximately 6 months ago. 11/08/16 patient on evaluation today fortunately does not appear to be having as much discomfort as she has in the past. She is tolerating the dressing changes well although she does note some irritation where the wound VAC has been applied. She specifically wonders if we could see about taking a break possibly progressing to discontinuation of the wound VAC. There is no evidence of infection. Electronic Signature(s) Signed: 11/15/2016 4:22:47 PM By: Evlyn Kanner MD, FACS Entered By: Evlyn Kanner on 11/15/2016 16:22:47 [name], [name] ([id number]) -------------------------------------------------------------------------------- Physical Exam Details [name] Date of Service: 11/15/2016 3:30 PM Medical  Record Number: [id number] Patient Account Number: [id number] Date of Birth/Sex: September 08, 1945 (70 y.o. Female) Treating RN: Huel Coventry Primary Care Provider: Milon Score Other Clinician: Referring Provider: Treating Provider/Extender: Rudene Re in  Treatment: 9 Constitutional . Pulse regular. Respirations normal and unlabored. Afebrile. . Eyes Nonicteric. Reactive to light. Ears, Nose, Mouth, and Throat Lips, teeth, and gums WNL.Marland Kitchen Moist mucosa without lesions. Neck supple and nontender. No palpable supraclavicular or cervical adenopathy. Normal sized without goiter. Respiratory WNL. No retractions.. Cardiovascular Pedal Pulses WNL. No clubbing, cyanosis or edema. Lymphatic No adneopathy. No adenopathy. No adenopathy. Musculoskeletal Adexa without tenderness or enlargement.. Digits and nails w/o clubbing, cyanosis, infection, petechiae, ischemia, or inflammatory conditions.. Integumentary (Hair, Skin) No suspicious lesions. No crepitus or fluctuance. No peri-wound warmth or erythema. No masses.Marland Kitchen Psychiatric Judgement and insight Intact.. No evidence of depression, anxiety, or agitation.. Notes the wound has significant undermining between the 6 and 12:00 position and this goes almost a centimeter and a half at the 9:00 position. there is good amount of granulation tissue. No surrounding cellulitis Electronic Signature(s) Signed: 11/15/2016 4:23:49 PM By: Evlyn Kanner MD, FACS Previous Signature: 11/15/2016 4:23:37 PM Version By: Evlyn Kanner MD, FACS Entered By: Evlyn Kanner on 11/15/2016 16:23:49 [name] ([id number]) -------------------------------------------------------------------------------- Physician Orders Details [name] Date of Service: 11/15/2016 3:30 PM Medical Record Number: [id number] Patient Account Number: [id number] Date of Birth/Sex: Jan 31, 1946 (70 y.o. Female) Treating RN: Huel Coventry Primary Care Provider: Milon Score Other Clinician: Referring Provider: Treating Provider/Extender: Rudene Re in Treatment: 9 Verbal / Phone Orders: No Diagnosis Coding Wound Cleansing Wound #1 Right,Proximal,Anterior Lower Leg o Clean wound with Normal Saline. o May Shower, gently pat wound dry prior to applying new dressing. Anesthetic Wound #1 Right,Proximal,Anterior Lower Leg o Topical Lidocaine 4% cream applied to wound bed prior to debridement Skin Barriers/Peri-Wound Care Wound #1 Right,Proximal,Anterior Lower Leg o Skin Prep Primary Wound Dressing Wound #1 Right,Proximal,Anterior Lower Leg o Cutimed Sorbact - packed lightly into wound Secondary Dressing o Gauze and Kerlix/Conform o Coban - for pressure Dressing Change Frequency Wound #1 Right,Proximal,Anterior Lower Leg o Change dressing every other day. Follow-up Appointments Wound #1 Right,Proximal,Anterior Lower Leg o Return Appointment in 1 week. o Nurse Visit as needed Edema Control Wound #1 Right,Proximal,Anterior Lower Leg o Elevate legs to the level of the heart and pump ankles as often as possible Additional Orders / Instructions [name], [name] ([id number]) Wound #1 Right,Proximal,Anterior Lower Leg o Stop Smoking o Increase protein intake. o Other: - Please try to keep blood sugars below 180 Electronic Signature(s) Signed: 11/15/2016 4:27:48 PM By: Evlyn Kanner MD, FACS Signed: 11/15/2016 5:03:13 PM By: Elliot Gurney RN, BSN, Kim RN, BSN Entered By: Elliot Gurney, RN, BSN, Kim on 11/15/2016 16:01:22 [name], [name] ([id number]) -------------------------------------------------------------------------------- Problem List Details [name] Date of Service: 11/15/2016 3:30 PM Medical Record Number: [id number] Patient Account Number: [id number] Date of Birth/Sex: [birth year] (71 y.o. 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 Female) Treating RN: Huel CoventryWoody, Kim Primary Care Provider: Milon ScoreJONES, CARON Other Clinician: Referring Provider: Treating Provider/Extender: Rudene ReBritto, Aryahi Denzler Weeks in Treatment: 9 Subjective Chief Complaint Information obtained from Patient patient is here for follow-up evaluation of a right lower extremity ulcer History of Present Illness (HPI) The following HPI elements were documented for the patient's wound: Location: right lower extremity in the area of the lateral calf below the knee Quality: Patient reports experiencing a sharp pain to affected area(s) but this is not occurring at this point Severity: Patient states wound are getting better but too slowly for her liking Duration: Patient has had the wound for < 4 weeks prior to presenting for treatment Timing: Pain in wound is intermittent and less frequent these days Context: The wound occurred when the patient a blunt injury and fall Modifying Factors: Other treatment(s) tried  include:courses of doxycycline and local Bactroban ointment Associated Signs and Symptoms: Patient reports having increase discharge. 71 year old patient has been referred to as with right lower extremity wound with edema and ecchymosis, after having a injury due to a fall on 08/20/2016. she has a history of diabetes mellitus type 2 which is uncomplicated, chronic back pain, COPD, hypertension, hypothyroidism, supraventricular tachycardia and tobacco abuse. He is status post neck surgery, appendectomy, cholecystectomy, hernia repair. He smokes about half a pack of cigarettes a day for the last 50 years. Recently on doxycycline for 10 days and was given symptomatic treatment for wound. 10/04/2016 -- the patient tolerated the snap vacuum system well and had no significant problems. She continues to smoke and is trying to give it up. 10/11/2016 -- she continues to tolerate the snap like him system fairly well and other than that she has no fresh issues 11/01/16- she is here for follow-up evaluation of her right lower extremity ulcer. She is tolerating the SNAP negative pressure system. She is voicing no complaints of pain. She states her glucose levels are consistently less than 150 with her evening glucose levels being higher than morning glucose levels. She continues to smoke, approximately 0.5-ppd which is a reduction of 1-ppd approximately 6 months ago. 11/08/16 patient on evaluation today fortunately does not appear to be having as much discomfort as she has in the past. She is tolerating the dressing changes well although she does note some irritation where the wound VAC has been applied. She specifically wonders if we could see about taking a break possibly progressing to discontinuation of the wound VAC. There is no evidence of infection. [name], [name] ([id number]) Objective Constitutional Pulse regular. Respirations normal and unlabored. Afebrile. Vitals Time Taken: 3:30 PM, Height: 67  in, Weight: 197 lbs, BMI: 30.9, Temperature: 97.9 F, Pulse: 79 bpm, Respiratory Rate: 16 breaths/min, Blood Pressure: 138/78 mmHg. Eyes Nonicteric. Reactive to light. Ears, Nose, Mouth, and Throat Lips, teeth, and gums WNL.Marland Kitchen. Moist mucosa without lesions. Neck supple and nontender. No palpable supraclavicular or cervical adenopathy. Normal sized without goiter. Respiratory WNL. No retractions.. Cardiovascular Pedal Pulses WNL. No clubbing, cyanosis or edema. Lymphatic No adneopathy. No adenopathy. No adenopathy. Musculoskeletal Adexa without tenderness or enlargement.. Digits and nails w/o clubbing, cyanosis, infection, petechiae, ischemia, or inflammatory conditions.Marland Kitchen. Psychiatric Judgement and insight Intact.. No evidence of depression, anxiety, or agitation.. General Notes: the wound has significant undermining between the 6 and 12:00 position and this goes almost a centimeter and a half at the 9:00 position. there is good amount of granulation tissue. No surrounding cellulitis Integumentary (Hair, Skin)  No suspicious lesions. No crepitus or fluctuance. No peri-wound warmth or erythema. No masses.. Wound #1 status is Open. Original cause of wound was Trauma. The wound is located on the Right,Proximal,Anterior Lower Leg. The wound measures 2cm length x 1.3cm width x 0.1cm depth; 2.042cm^2 area and 0.204cm^3 volume. There is Fat Layer (Subcutaneous Tissue) Exposed exposed. There is undermining starting at 7:00 and ending at 11:00 with a maximum distance of 1.2cm. There is a large amount of serous drainage noted. The wound margin is flat and intact. There is large (67-100%) red [name], [name] ([id number]) granulation within the wound bed. There is a small (1-33%) amount of necrotic tissue within the wound bed including Adherent Slough. The periwound skin appearance exhibited: Scarring, Maceration, Ecchymosis. The periwound skin appearance did not exhibit: Callus, Crepitus,  Excoriation, Induration, Rash, Dry/Scaly, Atrophie Blanche, Cyanosis, Hemosiderin Staining, Mottled, Pallor, Rubor, Erythema. Periwound temperature was noted as No Abnormality. The periwound has tenderness on palpation. 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 May Shower, gently pat wound dry prior to applying new dressing. Anesthetic: Wound #1 Right,Proximal,Anterior Lower Leg: Topical Lidocaine 4% cream applied to wound bed prior to debridement Skin Barriers/Peri-Wound Care: Wound #1 Right,Proximal,Anterior Lower Leg: Skin Prep Primary Wound Dressing: Wound #1 Right,Proximal,Anterior Lower Leg: Cutimed Sorbact - packed lightly into wound Secondary Dressing: Gauze and Kerlix/Conform Coban - for pressure Dressing Change Frequency: Wound #1 Right,Proximal,Anterior Lower Leg: Change dressing every other day. Follow-up Appointments: Wound #1 Right,Proximal,Anterior Lower Leg: Return Appointment in 1 week. Nurse Visit as needed Edema Control: [name], LAUF. ([id number]) Wound #1 Right,Proximal,Anterior Lower Leg: Elevate legs to the level of the heart and pump ankles as often as possible Additional Orders / Instructions: Wound #1 Right,Proximal,Anterior Lower Leg: Stop Smoking Increase protein intake. Other: - Please try to keep blood sugars below 180 The snap has been discontinued last week and we have been seeing the patient for about 2 months and she's had appropriate wound VAC management and still continues to have the lateral undermining and flap. at this stage I have recommended  placing a small piece of Sorbact under the skin flap and using a pressure dressing externally to try and close the dead space. She is encouraged to give up smoking and continue adequate protein and vitamin supplements Electronic Signature(s) Signed: 11/15/2016 4:25:39 PM By: Evlyn Kanner MD, FACS Entered By: Evlyn Kanner on 11/15/2016 16:25:38 [name], [name] ([id number]) -------------------------------------------------------------------------------- SuperBill Details [name] Date of Service: 11/15/2016 Medical Record Number: [id number] Patient Account Number: [id number] Date of Birth/Sex: Sep 05, 1945 (70 y.o. Female) Treating RN: Huel Coventry Primary Care Provider: Milon Score Other Clinician: Referring Provider: Treating Provider/Extender: Rudene Re in Treatment: 9 Diagnosis Coding ICD-10 Codes Code Description E11.622 Type 2 diabetes mellitus with other skin ulcer M70.861 Other soft tissue disorders related to use, overuse and pressure, right lower leg L97.212 Non-pressure chronic ulcer of right calf with fat layer exposed F17.218 Nicotine dependence, cigarettes, with other nicotine-induced disorders Facility Procedures CPT4 Code: [id number] Description: 99213 - WOUND CARE VISIT-LEV 3 EST PT Modifier: Quantity: 1 Physician Procedures CPT4: Description Modifier Quantity Code [id number] 99213 - WC PHYS LEVEL 3 - EST PT 1  ICD-10 Description Diagnosis E11.622 Type 2 diabetes mellitus with other skin ulcer M70.861 Other soft tissue disorders related to use, overuse and pressure, right lower  leg L97.212 Non-pressure chronic ulcer of right calf with fat layer exposed F17.218 Nicotine dependence, cigarettes, with other nicotine-induced disorders Electronic Signature(s) Signed: 11/15/2016 4:26:33 PM By: Evlyn Kanner MD, FACS Entered By: Evlyn Kanner on 11/15/2016 16:26:33

## 2016-11-17 NOTE — Progress Notes (Signed)
SAHORY, NORDLING ([id number]) Visit Report for 11/15/2016 Arrival Information Details [name], [name] Date of Service: 11/15/2016 3:30 PM Medical Record Number: [id number] Patient Account Number: [id number] Date of Birth/Sex: 1946/01/25 (70 y.o. Female) Treating RN: Huel Coventry Primary Care Elam Ellis: Milon Score Other Clinician: Referring Syd Newsome: Treating Dalana Pfahler/Extender: Rudene Re in Treatment: 9 Visit Information History Since Last Visit Added or deleted any medications: No Patient Arrived: Ambulatory Any new allergies or adverse reactions: No Arrival Time: 15:30 Had a fall or experienced change in No Accompanied By: self activities of daily living that may affect Transfer Assistance: None risk of falls: Patient Identification Verified: Yes Signs or symptoms of abuse/neglect since last No Secondary Verification Process Yes visito Completed: Hospitalized since last visit: No Patient Requires Transmission-Based No Has Dressing in Place as Prescribed: Yes Precautions: Pain Present Now: No Patient Has Alerts: Yes Patient Alerts: DMII Electronic Signature(s) Signed: 11/15/2016 5:03:13 PM By: Elliot Gurney, RN, BSN, Kim RN, BSN Entered By: Elliot Gurney, RN, BSN, Kim on 11/15/2016 15:30:25 [name] ([id number]) -------------------------------------------------------------------------------- Clinic Level of Care Assessment Details [name] Date of Service: 11/15/2016 3:30 PM Medical Record Number: [id number] Patient Account Number: [id number] Date of Birth/Sex: 10-15-1945 (70 y.o. Female) Treating RN: Huel Coventry Primary Care Darrion Macaulay: Milon Score Other Clinician: Referring Sanae Willetts: Treating Bryon Parker/Extender: Rudene Re in Treatment: 9 Clinic Level of Care Assessment Items TOOL 4 Quantity Score []  - Use when only an EandM is performed on FOLLOW-UP visit 0 ASSESSMENTS - Nursing Assessment / Reassessment []  - Reassessment of  Co-morbidities (includes updates in patient status) 0 X - Reassessment of Adherence to Treatment Plan 1 5 ASSESSMENTS - Wound and Skin Assessment / Reassessment X - Simple Wound Assessment / Reassessment - one wound 1 5 []  - Complex Wound Assessment / Reassessment - multiple wounds 0 []  - Dermatologic / Skin Assessment (not related to wound area) 0 ASSESSMENTS - Focused Assessment []  - Circumferential Edema Measurements - multi extremities 0 []  - Nutritional Assessment / Counseling / Intervention 0 []  - Lower Extremity Assessment (monofilament, tuning fork, pulses) 0 []  - Peripheral Arterial Disease Assessment (using hand held doppler) 0 ASSESSMENTS - Ostomy and/or Continence Assessment and Care []  - Incontinence Assessment and Management 0 []  - Ostomy Care Assessment and Management (repouching, etc.) 0 PROCESS - Coordination of Care X - Simple Patient / Family Education for ongoing care 1 15 []  - Complex (extensive) Patient / Family Education for ongoing care 0 X - Staff obtains Chiropractor, Records, Test Results / Process Orders 1 10 []  - Staff telephones HHA, Nursing Homes / Clarify orders / etc 0 []  - Routine Transfer to another Facility (non-emergent condition) 0 [name], [name] ([id number]) []  - Routine Hospital Admission (non-emergent condition) 0 []  - New Admissions / Manufacturing engineer / Ordering NPWT, Apligraf, etc. 0 []  - Emergency Hospital Admission (emergent condition) 0 X - Simple Discharge Coordination 1 10 []  - Complex (extensive) Discharge Coordination 0 PROCESS - Special Needs []  - Pediatric / Minor Patient Management 0 []  - Isolation Patient Management 0 []  - Hearing / Language / Visual special needs 0 []  - Assessment of Community assistance (transportation, D/C planning, etc.) 0 []  - Additional assistance / Altered mentation 0 []  - Support Surface(s) Assessment (bed, cushion, seat, etc.) 0 INTERVENTIONS - Wound Cleansing / Measurement X - Simple Wound  Cleansing - one wound 1 5 []  - Complex Wound Cleansing - multiple wounds 0 X - Wound Imaging (photographs - any number of wounds)  1 5 []  - Wound Tracing (instead of photographs) 0 X - Simple Wound Measurement - one wound 1 5 []  - Complex Wound Measurement - multiple wounds 0 INTERVENTIONS - Wound Dressings []  - Small Wound Dressing one or multiple wounds 0 X - Medium Wound Dressing one or multiple wounds 1 15 []  - Large Wound Dressing one or multiple wounds 0 []  - Application of Medications - topical 0 []  - Application of Medications - injection 0 INTERVENTIONS - Miscellaneous []  - External ear exam 0 Febus, Anselma C. ([id number]) []  - Specimen Collection (cultures, biopsies, blood, body fluids, etc.) 0 []  - Specimen(s) / Culture(s) sent or taken to Lab for analysis 0 []  - Patient Transfer (multiple staff / Michiel SitesHoyer Lift / Similar devices) 0 []  - Simple Staple / Suture removal (25 or less) 0 []  - Complex Staple / Suture removal (26 or more) 0 []  - Hypo / Hyperglycemic Management (close monitor of Blood Glucose) 0 []  - Ankle / Brachial Index (ABI) - do not check if billed separately 0 X - Vital Signs 1 5 Has the patient been seen at the hospital within the last three years: Yes Total Score: 80 Level Of Care: New/Established - Level 3 Electronic Signature(s) Signed: 11/15/2016 5:03:13 PM By: Elliot GurneyWoody, RN, BSN, Kim RN, BSN Entered By: Elliot GurneyWoody, RN, BSN, Kim on 11/15/2016 16:02:16 [name], [name] ([id number]) -------------------------------------------------------------------------------- Encounter Discharge Information Details [name], [name] Date of Service: 11/15/2016 3:30 PM Medical Record Number: [id number] Patient Account Number: [id number] Date of Birth/Sex: Dec 31, 1945 (70 y.o. Female) Treating RN: Huel CoventryWoody, Kim Primary Care Ariv Penrod: Milon ScoreJONES, CARON Other Clinician: Referring Nyeisha Goodall: Treating Davit Vassar/Extender: Rudene ReBritto, Errol Weeks in Treatment: 9 Encounter Discharge  Information Items Discharge Pain Level: 0 Discharge Condition: Stable Ambulatory Status: Ambulatory Discharge Destination: Home Transportation: Private Auto Accompanied By: self Schedule Follow-up Appointment: Yes Medication Reconciliation completed and provided to Patient/Care Yes Duell Holdren: Provided on Clinical Summary of Care: 11/15/2016 Form Type Recipient Paper Patient ML Electronic Signature(s) Signed: 11/15/2016 5:03:13 PM By: Elliot GurneyWoody, RN, BSN, Kim RN, BSN Previous Signature: 11/15/2016 4:02:08 PM Version By: Gwenlyn PerkingMoore, Shelia Entered By: Elliot GurneyWoody, RN, BSN, Kim on 11/15/2016 16:03:43 [name], [name] ([id number]) -------------------------------------------------------------------------------- Lower Extremity Assessment Details [name], [name] Date of Service: 11/15/2016 3:30 PM Medical Record Number: [id number] Patient Account Number: [id number] Date of Birth/Sex: Dec 31, 1945 (70 y.o. Female) Treating RN: Huel CoventryWoody, Kim Primary Care Arita Severtson: Milon ScoreJONES, CARON Other Clinician: Referring Jenasis Straley: Treating Evania Lyne/Extender: Rudene ReBritto, Errol Weeks in Treatment: 9 Vascular Assessment Pulses: Dorsalis Pedis Palpable: [Right:Yes] Posterior Tibial Palpable: [Right:Yes] Extremity colors, hair growth, and conditions: Extremity Color: [Right:Normal] Hair Growth on Extremity: [Right:Yes] Temperature of Extremity: [Right:Warm] Capillary Refill: [Right:< 3 seconds] Dependent Rubor: [Right:No] Blanched when Elevated: [Right:No] Lipodermatosclerosis: [Right:No] Toe Nail Assessment Left: Right: Thick: No Discolored: No Deformed: No Improper Length and Hygiene: No Electronic Signature(s) Signed: 11/15/2016 5:03:13 PM By: Elliot GurneyWoody, RN, BSN, Kim RN, BSN Entered By: Elliot GurneyWoody, RN, BSN, Kim on 11/15/2016 15:42:52 [name], Diasia C. ([id number]) -------------------------------------------------------------------------------- Multi Wound Chart Details [name], [name] Date of Service:  11/15/2016 3:30 PM Medical Record Number: [id number] Patient Account Number: [id number] Date of Birth/Sex: Dec 31, 1945 (70 y.o. Female) Treating RN: Huel CoventryWoody, Kim Primary Care Arriyanna Mersch: Milon ScoreJONES, CARON Other Clinician: Referring Atziri Zubiate: Treating Irais Mottram/Extender: Rudene ReBritto, Errol Weeks in Treatment: 9 Vital Signs Height(in): 67 Pulse(bpm): 79 Weight(lbs): 197 Blood Pressure 138/78 (mmHg): Body Mass Index(BMI): 31 Temperature(F): 97.9 Respiratory Rate 16 (breaths/min): Photos: [1:No Photos] [N/A:N/A] Wound Location: [1:Right Lower Leg - Anterior, Proximal] [N/A:N/A] Wounding Event: [1:Trauma] [  N/A:N/A] Primary Etiology: [1:Trauma, Other] [N/A:N/A] Comorbid History: [1:Chronic Obstructive Pulmonary Disease (COPD), Hypertension, Type II Diabetes] [N/A:N/A] Date Acquired: [1:08/17/2016] [N/A:N/A] Weeks of Treatment: [1:9] [N/A:N/A] Wound Status: [1:Open] [N/A:N/A] Measurements L x W x D 2x1.3x0.1 [N/A:N/A] (cm) Area (cm) : [1:2.042] [N/A:N/A] Volume (cm) : [1:0.204] [N/A:N/A] % Reduction in Area: [1:44.10%] [N/A:N/A] % Reduction in Volume: 44.10% [N/A:N/A] Starting Position 1 7 (o'clock): Ending Position 1 [1:11] (o'clock): Maximum Distance 1 1.2 (cm): Undermining: [1:Yes] [N/A:N/A] Classification: [1:Full Thickness Without Exposed Support Structures] [N/A:N/A] HBO Classification: [1:Grade 1] [N/A:N/A] Exudate Amount: [1:Large] [N/A:N/A] Exudate Type: Serous N/A N/A Exudate Color: amber N/A N/A Wound Margin: Flat and Intact N/A N/A Granulation Amount: Large (67-100%) N/A N/A Granulation Quality: Red, Hyper-granulation N/A N/A Necrotic Amount: Small (1-33%) N/A N/A Exposed Structures: Fat Layer (Subcutaneous N/A N/A Tissue) Exposed: Yes Fascia: No Tendon: No Muscle: No Joint: No Bone: No Epithelialization: Small (1-33%) N/A N/A Periwound Skin Texture: Scarring: Yes N/A N/A Excoriation: No Induration: No Callus: No Crepitus: No Rash: No Periwound Skin Maceration:  Yes N/A N/A Moisture: Dry/Scaly: No Periwound Skin Color: Ecchymosis: Yes N/A N/A Atrophie Blanche: No Cyanosis: No Erythema: No Hemosiderin Staining: No Mottled: No Pallor: No Rubor: No Temperature: No Abnormality N/A N/A Tenderness on Yes N/A N/A Palpation: Wound Preparation: Ulcer Cleansing: N/A N/A Rinsed/Irrigated with Saline Topical Anesthetic Applied: Other: lidocaine 4% Treatment Notes Wound #1 (Right, Proximal, Anterior Lower Leg) 1. Cleansed with: Clean wound with Normal Saline 2. Anesthetic Topical Lidocaine 4% cream to wound bed prior to debridement 4. Dressing Applied: Other dressing (specify in notes) [name], [name] ([id number]) 5. Secondary Dressing Applied Gauze and Kerlix/Conform 7. Secured with Other (specify in notes) Notes Sorbact, Gauze to bolster, Coban for pressure Electronic Signature(s) Signed: 11/15/2016 4:22:29 PM By: Evlyn Kanner MD, FACS Entered By: Evlyn Kanner on 11/15/2016 16:22:29 [name], [name] ([id number]) -------------------------------------------------------------------------------- Multi-Disciplinary Care Plan Details [name] Date of Service: 11/15/2016 3:30 PM Medical Record Number: [id number] Patient Account Number: [id number] Date of Birth/Sex: January 29, 1946 (70 y.o. Female) Treating RN: Huel Coventry Primary Care Andrea Ferrer: Milon Score Other Clinician: Referring Roshanna Cimino: Treating Clide Remmers/Extender: Rudene Re in Treatment: 9 Active Inactive ` Necrotic Tissue Nursing Diagnoses: Impaired tissue integrity related to necrotic/devitalized tissue Goals: Necrotic/devitalized tissue will be minimized in the wound bed Date Initiated: 09/13/2016 Target Resolution Date: 10/18/2016 Goal Status: Active Interventions: Assess patient pain level pre-, during and post procedure and prior to discharge Notes: ` Orientation to the Wound Care Program Nursing Diagnoses: Knowledge deficit related to the wound  healing center program Goals: Patient/caregiver will verbalize understanding of the Wound Healing Center Program Date Initiated: 09/13/2016 Target Resolution Date: 12/20/2016 Goal Status: Active Interventions: Provide education on orientation to the wound center Notes: ` Wound/Skin Impairment Nursing Diagnoses: Impaired tissue integrity [name], [name] ([id number]) Goals: Patient/caregiver will verbalize understanding of skin care regimen Date Initiated: 09/13/2016 Target Resolution Date: 12/20/2016 Goal Status: Active Ulcer/skin breakdown will have a volume reduction of 30% by week 4 Date Initiated: 09/13/2016 Target Resolution Date: 12/20/2016 Goal Status: Active Ulcer/skin breakdown will have a volume reduction of 50% by week 8 Date Initiated: 09/13/2016 Target Resolution Date: 12/20/2016 Goal Status: Active Ulcer/skin breakdown will have a volume reduction of 80% by week 12 Date Initiated: 09/13/2016 Target Resolution Date: 12/20/2016 Goal Status: Active Ulcer/skin breakdown will heal within 14 weeks Date Initiated: 09/13/2016 Target Resolution Date: 12/20/2016 Goal Status: Active Interventions: Assess patient/caregiver ability to obtain necessary supplies Assess patient/caregiver ability to perform ulcer/skin care regimen upon  admission and as needed Assess ulceration(s) every visit Notes: Electronic Signature(s) Signed: 11/15/2016 5:03:13 PM By: Elliot Gurney, RN, BSN, Kim RN, BSN Entered By: Elliot Gurney, RN, BSN, Kim on 11/15/2016 15:43:00 [name], [name] ([id number]) -------------------------------------------------------------------------------- Pain Assessment Details [name] Date of Service: 11/15/2016 3:30 PM Medical Record Number: [id number] Patient Account Number: [id number] Date of Birth/Sex: 09-Aug-1945 (70 y.o. Female) Treating RN: Huel Coventry Primary Care Keller Mikels: Milon Score Other Clinician: Referring Katryna Tschirhart: Treating Neriyah Cercone/Extender: Rudene Re in  Treatment: 9 Active Problems Location of Pain Severity and Description of Pain Patient Has Paino No Site Locations With Dressing Change: No Pain Management and Medication Current Pain Management: Electronic Signature(s) Signed: 11/15/2016 5:03:13 PM By: Elliot Gurney, RN, BSN, Kim RN, BSN Entered By: Elliot Gurney, RN, BSN, Kim on 11/15/2016 15:30:32 [name] ([id number]) -------------------------------------------------------------------------------- Patient/Caregiver Education Details [name] Date of Service: 11/15/2016 3:30 PM Medical Record Number: [id number] Patient Account Number: [id number] Date of Birth/Gender: Aug 18, 1945 (70 y.o. Female) Treating RN: Huel Coventry Primary Care Physician: Milon Score Other Clinician: Referring Physician: Treating Physician/Extender: Rudene Re in Treatment: 9 Education Assessment Education Provided To: Patient Education Topics Provided Wound/Skin Impairment: Handouts: Caring for Your Ulcer Methods: Demonstration Responses: State content correctly Electronic Signature(s) Signed: 11/15/2016 5:03:13 PM By: Elliot Gurney, RN, BSN, Kim RN, BSN Entered By: Elliot Gurney, RN, BSN, Kim on 11/15/2016 16:03:53 [name], [name] ([id number]) -------------------------------------------------------------------------------- Wound Assessment Details [name] Date of Service: 11/15/2016 3:30 PM Medical Record Number: [id number] Patient Account Number: [id number] Date of Birth/Sex: 08-13-1945 (70 y.o. Female) Treating RN: Huel Coventry Primary Care Sayre Mazor: Milon Score Other Clinician: Referring Lara Palinkas: Treating Carlisle Torgeson/Extender: Rudene Re in Treatment: 9 Wound Status Wound Number: 1 Primary Trauma, Other Etiology: Wound Location: Right Lower Leg - Anterior, Proximal Wound Open Status: Wounding Event: Trauma Comorbid Chronic Obstructive Pulmonary Date Acquired: 08/17/2016 History: Disease (COPD), Hypertension,  Type Weeks Of Treatment: 9 II Diabetes Clustered Wound: No Wound Measurements Length: (cm) 2 Width: (cm) 1.3 Depth: (cm) 0.1 Area: (cm) 2.042 Volume: (cm) 0.204 % Reduction in Area: 44.1% % Reduction in Volume: 44.1% Epithelialization: Small (1-33%) Undermining: Yes Starting Position (o'clock): 7 Ending Position (o'clock): 11 Maximum Distance: (cm) 1.2 Wound Description Full Thickness Without Foul Odor Aft Classification: Exposed Support Structures Slough/Fibrin Diabetic Severity Grade 1 (Wagner): Wound Margin: Flat and Intact Exudate Amount: Large Exudate Type: Serous Exudate Color: amber er Cleansing: No o No Wound Bed Granulation Amount: Large (67-100%) Exposed Structure Granulation Quality: Red, Hyper-granulation Fascia Exposed: No Necrotic Amount: Small (1-33%) Fat Layer (Subcutaneous Tissue) Exposed: Yes Necrotic Quality: Adherent Slough Tendon Exposed: No Muscle Exposed: No Joint Exposed: No Bone Exposed: No Periwound Skin Texture Haegele, Gini C. ([id number]) Texture Color No Abnormalities Noted: No No Abnormalities Noted: No Callus: No Atrophie Blanche: No Crepitus: No Cyanosis: No Excoriation: No Ecchymosis: Yes Induration: No Erythema: No Rash: No Hemosiderin Staining: No Scarring: Yes Mottled: No Pallor: No Moisture Rubor: No No Abnormalities Noted: No Dry / Scaly: No Temperature / Pain Maceration: Yes Temperature: No Abnormality Tenderness on Palpation: Yes Wound Preparation Ulcer Cleansing: Rinsed/Irrigated with Saline Topical Anesthetic Applied: Other: lidocaine 4%, Treatment Notes Wound #1 (Right, Proximal, Anterior Lower Leg) 1. Cleansed with: Clean wound with Normal Saline 2. Anesthetic Topical Lidocaine 4% cream to wound bed prior to debridement 4. Dressing Applied: Other dressing (specify in notes) 5. Secondary Dressing Applied Gauze and Kerlix/Conform 7. Secured with Other (specify in notes) Notes Sorbact,  Gauze to bolster, Coban for pressure Electronic Signature(s)  Signed: 11/15/2016 5:03:13 PM By: Elliot Gurney RN, BSN, Kim RN, BSN Entered By: Elliot Gurney, RN, BSN, Kim on 11/15/2016 15:37:51 LASHELL, MOFFITT ([id number]) -------------------------------------------------------------------------------- Vitals Details [name] Date of Service: 11/15/2016 3:30 PM Medical Record Number: [id number] Patient Account Number: [id number] Date of Birth/Sex: [birth year] (70 y.o. Female) Treating RN: Huel Coventry Primary Care Miryah Ralls: Milon Score Other Clinician: Referring Neco Kling: Treating Zayvian Mcmurtry/Extender: Rudene Re in Treatment: 9 Vital Signs Time Taken: 15:30 Temperature (F): 97.9 Height (in): 67 Pulse (bpm): 79 Weight (lbs): 197 Respiratory Rate (breaths/min): 16 Body Mass Index (BMI): 30.9 Blood Pressure (mmHg): 138/78 Reference Range: 80 - 120 mg / dl Electronic Signature(s) Signed: 11/15/2016 5:03:13 PM By: Elliot Gurney, RN, BSN, Kim RN, BSN Entered By: Elliot Gurney, RN, BSN, Kim on 11/15/2016 15:31:16

## 2016-11-21 ENCOUNTER — Encounter: Payer: Medicare Other | Admitting: Surgery

## 2016-11-21 DIAGNOSIS — E11622 Type 2 diabetes mellitus with other skin ulcer: Secondary | ICD-10-CM | POA: Diagnosis not present

## 2016-11-23 NOTE — Progress Notes (Signed)
[name] ([id number]) Visit Report for 11/21/2016 Arrival Information Details [name], [name] Date of Service: 11/21/2016 2:30 PM Medical Record Number: [id number] Patient Account Number: [id number] Date of Birth/Sex: [birth year] (70 y.o. Female) Treating RN: Huel Coventry Primary Care Monifah Freehling: Milon Score Other Clinician: Referring Niquita Digioia: Treating Jatavious Peppard/Extender: Rudene Re in Treatment: 9 Visit Information History Since Last Visit Added or deleted any medications: No Patient Arrived: Ambulatory Any new allergies or adverse reactions: No Arrival Time: 14:22 Had a fall or experienced change in No Accompanied By: self activities of daily living that may affect Transfer Assistance: None risk of falls: Patient Identification Verified: Yes Signs or symptoms of abuse/neglect since last No Secondary Verification Process Yes visito Completed: Hospitalized since last visit: No Patient Requires Transmission-Based No Has Dressing in Place as Prescribed: Yes Precautions: Pain Present Now: No Patient Has Alerts: Yes Patient Alerts: DMII Electronic Signature(s) Signed: 11/21/2016 4:30:01 PM By: Elliot Gurney, RN, BSN, Kim RN, BSN Entered By: Elliot Gurney, RN, BSN, Kim on 11/21/2016 14:22:45 [name] ([id number]) -------------------------------------------------------------------------------- Encounter Discharge Information Details [name] Date of Service: 11/21/2016 2:30 PM Medical Record Number: [id number] Patient Account Number: [id number] Date of Birth/Sex: 1946-05-03 (70 y.o. Female) Treating RN: Huel Coventry Primary Care Korion Cuevas: Milon Score Other Clinician: Referring Haide Klinker: Treating Ramon Brant/Extender: Rudene Re in Treatment: 9 Encounter Discharge Information Items Discharge Pain Level: 0 Discharge Condition: Stable Ambulatory Status: Ambulatory Discharge Destination: Home Transportation: Private Auto Accompanied  By: self Schedule Follow-up Appointment: Yes Medication Reconciliation completed and provided to Patient/Care Yes Steffon Gladu: Provided on Clinical Summary of Care: 11/21/2016 Form Type Recipient Paper Patient ML Electronic Signature(s) Signed: 11/21/2016 4:30:01 PM By: Elliot Gurney, RN, BSN, Kim RN, BSN Previous Signature: 11/21/2016 2:48:05 PM Version By: Gwenlyn Perking Entered By: Elliot Gurney RN, BSN, Kim on 11/21/2016 14:49:33 [name] ([id number]) -------------------------------------------------------------------------------- Lower Extremity Assessment Details [name] Date of Service: 11/21/2016 2:30 PM Medical Record Number: [id number] Patient Account Number: [id number] Date of Birth/Sex: [birth year] (70 y.o. Female) Treating RN: Huel Coventry Primary Care Eland Lamantia: Milon Score Other Clinician: Referring Bernabe Dorce: Treating Milisa Kimbell/Extender: Rudene Re in Treatment: 9 Edema Assessment Assessed: [Left: No] [Right: No] Edema: [Left: N] [Right: o] Vascular Assessment Pulses: Dorsalis Pedis Palpable: [Right:Yes] Posterior Tibial Palpable: [Right:Yes] Extremity colors, hair growth, and conditions: Extremity Color: [Right:Normal] Hair Growth on Extremity: [Right:Yes] Temperature of Extremity: [Right:Warm] Capillary Refill: [Right:< 3 seconds] Toe Nail Assessment Left: Right: Thick: No Discolored: No Deformed: No Improper Length and Hygiene: No Electronic Signature(s) Signed: 11/21/2016 4:30:01 PM By: Elliot Gurney, RN, BSN, Kim RN, BSN Entered By: Elliot Gurney, RN, BSN, Kim on 11/21/2016 14:31:09 [name] ([id number]) -------------------------------------------------------------------------------- Multi Wound Chart Details [name] Date of Service: 11/21/2016 2:30 PM Medical Record Number: [id number] Patient Account Number: [id number] Date of Birth/Sex: 1945-12-08 (70 y.o. Female) Treating RN: Huel Coventry Primary Care Aarsh Fristoe: Milon Score Other Clinician: Referring Azya Barbero: Treating Chirstopher Iovino/Extender: Rudene Re in Treatment: 9 Vital Signs Height(in): 67 Pulse(bpm): 75 Weight(lbs): 197 Blood Pressure 133/61 (mmHg): Body Mass Index(BMI): 31 Temperature(F): 97.9 Respiratory Rate 16 (breaths/min): Photos: [N/A:N/A] Wound Location: Right Lower Leg - N/A N/A Anterior, Proximal Wounding Event: Trauma N/A N/A Primary Etiology: Trauma, Other N/A N/A Comorbid History: Chronic Obstructive N/A N/A Pulmonary Disease (COPD), Hypertension, Type II Diabetes Date Acquired: 08/17/2016 N/A N/A Weeks of Treatment: 9 N/A N/A Wound Status: Open N/A N/A Measurements L x W x D 2x1.3x0.1 N/A N/A (cm) Area (cm) : 2.042 N/A N/A Volume (  cm) : 0.204 N/A N/A % Reduction in Area: 44.10% N/A N/A % Reduction in Volume: 44.10% N/A N/A Starting Position 1 7 (o'clock): Ending Position 1 11 (o'clock): Maximum Distance 1 1.1 (cm): Undermining: Yes N/A N/A Classification: N/A N/A [name], [name] ([id number]) Full Thickness Without Exposed Support Structures HBO Classification: Grade 1 N/A N/A Exudate Amount: Medium N/A N/A Exudate Type: Serous N/A N/A Exudate Color: amber N/A N/A Wound Margin: Epibole N/A N/A Granulation Amount: Large (67-100%) N/A N/A Granulation Quality: Red, Hyper-granulation N/A N/A Necrotic Amount: Small (1-33%) N/A N/A Exposed Structures: Fat Layer (Subcutaneous N/A N/A Tissue) Exposed: Yes Fascia: No Tendon: No Muscle: No Joint: No Bone: No Epithelialization: Small (1-33%) N/A N/A Debridement: Debridement ([id number]- N/A N/A 11047) Pre-procedure 14:15 N/A N/A Verification/Time Out Taken: Pain Control: Other N/A N/A Tissue Debrided: Fibrin/Slough, N/A N/A Subcutaneous Level: Skin/Subcutaneous N/A N/A Tissue Debridement Area (sq 2.6 N/A N/A cm): Instrument: Curette N/A N/A Bleeding: Minimum N/A N/A Hemostasis Achieved: Pressure N/A N/A Procedural Pain: 0 N/A N/A Post  Procedural Pain: 0 N/A N/A Debridement Treatment Procedure was tolerated N/A N/A Response: well Post Debridement 2x1.3x0.1 N/A N/A Measurements L x W x D (cm) Post Debridement 0.204 N/A N/A Volume: (cm) Periwound Skin Texture: Scarring: Yes N/A N/A Excoriation: No Induration: No Callus: No Crepitus: No Rash: No [name], [name] ([id number]) Periwound Skin Maceration: No N/A N/A Moisture: Dry/Scaly: No Periwound Skin Color: Ecchymosis: Yes N/A N/A Atrophie Blanche: No Cyanosis: No Erythema: No Hemosiderin Staining: No Mottled: No Pallor: No Rubor: No Temperature: No Abnormality N/A N/A Tenderness on Yes N/A N/A Palpation: Wound Preparation: Ulcer Cleansing: N/A N/A Rinsed/Irrigated with Saline Topical Anesthetic Applied: Other: lidocaine 4% Procedures Performed: Debridement N/A N/A Treatment Notes Wound #1 (Right, Proximal, Anterior Lower Leg) 1. Cleansed with: Clean wound with Normal Saline 2. Anesthetic Topical Lidocaine 4% cream to wound bed prior to debridement 4. Dressing Applied: Other dressing (specify in notes) Notes Sorbact, Gauze to bolster, Coban for pressure Electronic Signature(s) Signed: 11/21/2016 3:03:10 PM By: Evlyn Kanner MD, FACS Entered By: Evlyn Kanner on 11/21/2016 15:03:10 [name], [name] ([id number]) -------------------------------------------------------------------------------- Multi-Disciplinary Care Plan Details [name] Date of Service: 11/21/2016 2:30 PM Medical Record Number: [id number] Patient Account Number: [id number] Date of Birth/Sex: 02-11-1946 (70 y.o. Female) Treating RN: Huel Coventry Primary Care Hurshel Bouillon: Milon Score Other Clinician: Referring Kleber Crean: Treating Kairy Folsom/Extender: Rudene Re in Treatment: 9 Active Inactive ` Necrotic Tissue Nursing Diagnoses: Impaired tissue integrity related to necrotic/devitalized tissue Goals: Necrotic/devitalized tissue will be minimized in  the wound bed Date Initiated: 09/13/2016 Target Resolution Date: 10/18/2016 Goal Status: Active Interventions: Assess patient pain level pre-, during and post procedure and prior to discharge Notes: ` Orientation to the Wound Care Program Nursing Diagnoses: Knowledge deficit related to the wound healing center program Goals: Patient/caregiver will verbalize understanding of the Wound Healing Center Program Date Initiated: 09/13/2016 Target Resolution Date: 12/20/2016 Goal Status: Active Interventions: Provide education on orientation to the wound center Notes: ` Wound/Skin Impairment Nursing Diagnoses: Impaired tissue integrity [name], [name] ([id number]) Goals: Patient/caregiver will verbalize understanding of skin care regimen Date Initiated: 09/13/2016 Target Resolution Date: 12/20/2016 Goal Status: Active Ulcer/skin breakdown will have a volume reduction of 30% by week 4 Date Initiated: 09/13/2016 Target Resolution Date: 12/20/2016 Goal Status: Active Ulcer/skin breakdown will have a volume reduction of 50% by week 8 Date Initiated: 09/13/2016 Target Resolution Date: 12/20/2016 Goal Status: Active Ulcer/skin breakdown will have a volume reduction of 80% by week 12 Date Initiated:  09/13/2016 Target Resolution Date: 12/20/2016 Goal Status: Active Ulcer/skin breakdown will heal within 14 weeks Date Initiated: 09/13/2016 Target Resolution Date: 12/20/2016 Goal Status: Active Interventions: Assess patient/caregiver ability to obtain necessary supplies Assess patient/caregiver ability to perform ulcer/skin care regimen upon admission and as needed Assess ulceration(s) every visit Notes: Electronic Signature(s) Signed: 11/21/2016 4:30:01 PM By: Elliot Gurney, RN, BSN, Kim RN, BSN Entered By: Elliot Gurney, RN, BSN, Kim on 11/21/2016 14:35:33 [name] ([id number]) -------------------------------------------------------------------------------- Pain Assessment Details [name] Date of Service: 11/21/2016 2:30 PM Medical Record Number: [id number] Patient Account Number: [id number] Date of Birth/Sex: February 08, 1946 (70 y.o. Female) Treating RN: Huel Coventry Primary Care Ermina Oberman: Milon Score Other Clinician: Referring Teala Daffron: Treating Damonie Ellenwood/Extender: Rudene Re in Treatment: 9 Active Problems Location of Pain Severity and Description of Pain Patient Has Paino No Site Locations With Dressing Change: No Pain Management and Medication Current Pain Management: Electronic Signature(s) Signed: 11/21/2016 4:30:01 PM By: Elliot Gurney, RN, BSN, Kim RN, BSN Entered By: Elliot Gurney, RN, BSN, Kim on 11/21/2016 14:23:01 [name] ([id number]) -------------------------------------------------------------------------------- Patient/Caregiver Education Details [name] Date of Service: 11/21/2016 2:30 PM Medical Record Number: [id number] Patient Account Number: [id number] Date of Birth/Gender: 1945-08-18 (70 y.o. Female) Treating RN: Huel Coventry Primary Care Physician: Milon Score Other Clinician: Referring Physician: Treating Physician/Extender: Rudene Re in Treatment: 9 Education Assessment Education Provided To: Patient Education Topics Provided Wound/Skin Impairment: Handouts: Caring for Your Ulcer Methods: Demonstration, Explain/Verbal Responses: State content correctly Electronic Signature(s) Signed: 11/21/2016 4:30:01 PM By: Elliot Gurney, RN, BSN, Kim RN, BSN Entered By: Elliot Gurney, RN, BSN, Kim on 11/21/2016 14:49:47 [name], Janet ([id number]) -------------------------------------------------------------------------------- Wound Assessment Details [name] Date of Service: 11/21/2016 2:30 PM Medical Record Number: [id number] Patient Account Number: [id number] Date of Birth/Sex: 1946-04-09 (70 y.o. Female) Treating RN: Huel Coventry Primary Care Gila Lauf: Milon Score Other Clinician: Referring  Glendell Schlottman: Treating Lucifer Soja/Extender: Rudene Re in Treatment: 9 Wound Status Wound Number: 1 Primary Trauma, Other Etiology: Wound Location: Right Lower Leg - Anterior, Proximal Wound Open Status: Wounding Event: Trauma Comorbid Chronic Obstructive Pulmonary Date Acquired: 08/17/2016 History: Disease (COPD), Hypertension, Type Weeks Of Treatment: 9 II Diabetes Clustered Wound: No Photos Wound Measurements Length: (cm) 2 Width: (cm) 1.3 Depth: (cm) 0.1 Area: (cm) 2.042 Volume: (cm) 0.204 % Reduction in Area: 44.1% % Reduction in Volume: 44.1% Epithelialization: Small (1-33%) Undermining: Yes Starting Position (o'clock): 7 Ending Position (o'clock): 11 Maximum Distance: (cm) 1.1 Wound Description Full Thickness Without Foul Odor Aft Classification: Exposed Support Structures Slough/Fibrin Diabetic Severity Grade 1 (Wagner): Wound Margin: Epibole Exudate Amount: Medium Exudate Type: Serous Exudate Color: amber er Cleansing: No o No Wound Bed Granulation Amount: Large (67-100%) Exposed Structure [name], [name] ([id number]) Granulation Quality: Red, Hyper-granulation Fascia Exposed: No Necrotic Amount: Small (1-33%) Fat Layer (Subcutaneous Tissue) Exposed: Yes Necrotic Quality: Adherent Slough Tendon Exposed: No Muscle Exposed: No Joint Exposed: No Bone Exposed: No Periwound Skin Texture Texture Color No Abnormalities Noted: No No Abnormalities Noted: No Callus: No Atrophie Blanche: No Crepitus: No Cyanosis: No Excoriation: No Ecchymosis: Yes Induration: No Erythema: No Rash: No Hemosiderin Staining: No Scarring: Yes Mottled: No Pallor: No Moisture Rubor: No No Abnormalities Noted: No Dry / Scaly: No Temperature / Pain Maceration: No Temperature: No Abnormality Tenderness on Palpation: Yes Wound Preparation Ulcer Cleansing: Rinsed/Irrigated with Saline Topical Anesthetic Applied: Other: lidocaine 4%, Treatment Notes Wound #1  (Right, Proximal, Anterior Lower Leg) 1. Cleansed with: Clean wound with Normal Saline 2. Anesthetic  Topical Lidocaine 4% cream to wound bed prior to debridement 4. Dressing Applied: Other dressing (specify in notes) Notes Sorbact, Gauze to bolster, Coban for pressure Electronic Signature(s) Signed: 11/21/2016 4:30:01 PM By: Elliot GurneyWoody, RN, BSN, Kim RN, BSN Entered By: Elliot GurneyWoody, RN, BSN, Kim on 11/21/2016 14:29:15 Janet SimpersLANGLEY, [name] ([id number]) -------------------------------------------------------------------------------- Vitals Details Patient Name: Janet SimpersLANGLEY, [name] Date of Service: 11/21/2016 2:30 PM Medical Record Number: [id number] Patient Account Number: [id number] Date of Birth/Sex: Dec 01, 1945 (70 y.o. Female) Treating RN: Huel CoventryWoody, Kim Primary Care Thaddius Manes: Milon ScoreJONES, CARON Other Clinician: Referring Razia Screws: Treating Jamarius Saha/Extender: Rudene ReBritto, Errol Weeks in Treatment: 9 Vital Signs Time Taken: 14:23 Temperature (F): 97.9 Height (in): 67 Pulse (bpm): 75 Weight (lbs): 197 Respiratory Rate (breaths/min): 16 Body Mass Index (BMI): 30.9 Blood Pressure (mmHg): 133/61 Reference Range: 80 - 120 mg / dl Electronic Signature(s) Signed: 11/21/2016 4:30:01 PM By: Elliot GurneyWoody, RN, BSN, Kim RN, BSN Entered By: Elliot GurneyWoody, RN, BSN, Kim on 11/21/2016 14:23:19

## 2016-11-23 NOTE — Progress Notes (Signed)
JESSICAH, CROLL ([id number]) Visit Report for 11/21/2016 Chief Complaint Document Details [name], [name] Date of Service: 11/21/2016 2:30 PM Medical Record Number: [id number] Patient Account Number: [id number] Date of Birth/Sex: Aug 31, 1945 (71 y.o. Female) Treating RN: Huel Coventry Primary Care Provider: Milon Score Other Clinician: Referring Provider: Treating Provider/Extender: Rudene Re in Treatment: 9 Information Obtained from: Patient Chief Complaint patient is here for follow-up evaluation of a right lower extremity ulcer Electronic Signature(s) Signed: 11/21/2016 3:03:36 PM By: Evlyn Kanner MD, FACS Entered By: Evlyn Kanner on 11/21/2016 15:03:36 [name] ([id number]) -------------------------------------------------------------------------------- Debridement Details [name] Date of Service: 11/21/2016 2:30 PM Medical Record Number: [id number] Patient Account Number: [id number] Date of Birth/Sex: 1946-05-10 (71 y.o. Female) Treating RN: Huel Coventry Primary Care Provider: Milon Score Other Clinician: Referring Provider: Treating Provider/Extender: Rudene Re in Treatment: 9 Debridement Performed for Wound #1 Right,Proximal,Anterior Lower Leg Assessment: Performed By: Physician Evlyn Kanner, MD Debridement: Debridement Pre-procedure Yes - 14:15 Verification/Time Out Taken: Start Time: 14:16 Pain Control: Other : lidocaine 4% Level: Skin/Subcutaneous Tissue Total Area Debrided (L x 2 (cm) x 1.3 (cm) = 2.6 (cm) W): Tissue and other Viable, Non-Viable, Fibrin/Slough, Subcutaneous material debrided: Instrument: Curette Bleeding: Minimum Hemostasis Achieved: Pressure End Time: 14:19 Procedural Pain: 0 Post Procedural Pain: 0 Response to Treatment: Procedure was tolerated well Post Debridement Measurements of Total Wound Length: (cm) 2 Width: (cm) 1.3 Depth: (cm) 0.1 Volume: (cm) 0.204 Character  of Wound/Ulcer Post Requires Further Debridement Debridement: Severity of Tissue Post Debridement: Fat layer exposed Post Procedure Diagnosis Same as Pre-procedure Electronic Signature(s) Signed: 11/21/2016 3:03:30 PM By: Evlyn Kanner MD, FACS Signed: 11/21/2016 4:30:01 PM By: Elliot Gurney RN, BSN, Kim RN, BSN Previous Signature: 11/21/2016 3:03:19 PM Version By: Evlyn Kanner MD, FACS Entered By: Evlyn Kanner on 11/21/2016 15:03:30 [name], [name] ([id number]) [name], [name] ([id number]) -------------------------------------------------------------------------------- HPI Details [name] Date of Service: 11/21/2016 2:30 PM Medical Record Number: [id number] Patient Account Number: [id number] Date of Birth/Sex: [birth year] (71 y.o. Female) Treating RN: Huel Coventry Primary Care Provider: Milon Score Other Clinician: Referring Provider: Treating Provider/Extender: Rudene Re in Treatment: 9 History of Present Illness Location: right lower extremity in the area of the lateral calf below the knee Quality: Patient reports experiencing a sharp pain to affected area(s) but this is not occurring at this point Severity: Patient states wound are getting better but too slowly for her liking Duration: Patient has had the wound for < 4 weeks prior to presenting for treatment Timing: Pain in wound is intermittent and less frequent these days Context: The wound occurred when the patient a blunt injury and fall Modifying Factors: Other treatment(s) tried include:courses of doxycycline and local Bactroban ointment Associated Signs and Symptoms: Patient reports having increase discharge. HPI Description: 71 year old patient has been referred to as with right lower extremity wound with edema and ecchymosis, after having a injury due to a fall on 08/20/2016. she has a history of diabetes mellitus type 2 which is uncomplicated, chronic back pain, COPD, hypertension, hypothyroidism,  supraventricular tachycardia and tobacco abuse. He is status post neck surgery, appendectomy, cholecystectomy, hernia repair. He smokes about half a pack of cigarettes a day for the last 50 years. Recently on doxycycline for 10 days and was given symptomatic treatment for wound. 10/04/2016 -- the patient tolerated the snap vacuum system well and had no significant problems. She continues to smoke and is trying to give it up. 10/11/2016 -- she  continues to tolerate the snap like him system fairly well and other than that she has no fresh issues 11/01/16- she is here for follow-up evaluation of her right lower extremity ulcer. She is tolerating the SNAP negative pressure system. She is voicing no complaints of pain. She states her glucose levels are consistently less than 150 with her evening glucose levels being higher than morning glucose levels. She continues to smoke, approximately 0.5-ppd which is a reduction of 1-ppd approximately 6 months ago. 11/08/16 patient on evaluation today fortunately does not appear to be having as much discomfort as she has in the past. She is tolerating the dressing changes well although she does note some irritation where the wound VAC has been applied. She specifically wonders if we could see about taking a break possibly progressing to discontinuation of the wound VAC. There is no evidence of infection. Electronic Signature(s) Signed: 11/21/2016 3:03:40 PM By: Evlyn KannerBritto, Audel Coakley MD, FACS Entered By: Evlyn KannerBritto, Taquana Bartley on 11/21/2016 15:03:40 Janet SimpersLANGLEY, [name] ([id number]) -------------------------------------------------------------------------------- Physical Exam Details Patient Name: Janet SimpersLANGLEY, [name] Date of Service: 11/21/2016 2:30 PM Medical Record Number: [id number] Patient Account Number: [id number] Date of Birth/Sex: 1945/08/28 (71 y.o. Female) Treating RN: Huel CoventryWoody, Kim Primary Care Provider: Milon ScoreJONES, CARON Other Clinician: Referring Provider: Treating  Provider/Extender: Rudene ReBritto, Filbert Craze Weeks in Treatment: 9 Constitutional . Pulse regular. Respirations normal and unlabored. Afebrile. . Eyes Nonicteric. Reactive to light. Ears, Nose, Mouth, and Throat Lips, teeth, and gums WNL.Marland Kitchen. Moist mucosa without lesions. Neck supple and nontender. No palpable supraclavicular or cervical adenopathy. Normal sized without goiter. Respiratory WNL. No retractions.. Cardiovascular Pedal Pulses WNL. No clubbing, cyanosis or edema. Chest Breasts symmetical and no nipple discharge.. Breast tissue WNL, no masses, lumps, or tenderness.. Lymphatic No adneopathy. No adenopathy. No adenopathy. Musculoskeletal Adexa without tenderness or enlargement.. Digits and nails w/o clubbing, cyanosis, infection, petechiae, ischemia, or inflammatory conditions.. Integumentary (Hair, Skin) No suspicious lesions. No crepitus or fluctuance. No peri-wound warmth or erythema. No masses.Marland Kitchen. Psychiatric Judgement and insight Intact.. No evidence of depression, anxiety, or agitation.. Notes I freshen the edges of the undermined part of the wound and remove some of subcutaneous debris and this time around we will do a pressure dressing and see if this reduction in dead space habits the wound to preclude the undermining Electronic Signature(s) Signed: 11/21/2016 3:04:23 PM By: Evlyn KannerBritto, Aithana Kushner MD, FACS Entered By: Evlyn KannerBritto, Kaidyn Hernandes on 11/21/2016 15:04:22 Janet SimpersLANGLEY, Sylvan C. ([id number]) -------------------------------------------------------------------------------- Physician Orders Details Patient Name: Janet SimpersLANGLEY, Chania C. Date of Service: 11/21/2016 2:30 PM Medical Record Number: [id number] Patient Account Number: [id number] Date of Birth/Sex: 1945/08/28 (71 y.o. Female) Treating RN: Huel CoventryWoody, Kim Primary Care Provider: Milon ScoreJONES, CARON Other Clinician: Referring Provider: Treating Provider/Extender: Rudene ReBritto, Kuulei Kleier Weeks in Treatment: 9 Verbal / Phone Orders: No Diagnosis Coding Wound  Cleansing Wound #1 Right,Proximal,Anterior Lower Leg o Clean wound with Normal Saline. o May Shower, gently pat wound dry prior to applying new dressing. Anesthetic Wound #1 Right,Proximal,Anterior Lower Leg o Topical Lidocaine 4% cream applied to wound bed prior to debridement Skin Barriers/Peri-Wound Care Wound #1 Right,Proximal,Anterior Lower Leg o Skin Prep Primary Wound Dressing Wound #1 Right,Proximal,Anterior Lower Leg o Cutimed Sorbact - packed lightly into wound Secondary Dressing o Gauze and Kerlix/Conform o Coban - for pressure Dressing Change Frequency Wound #1 Right,Proximal,Anterior Lower Leg o Change dressing every other day. Follow-up Appointments Wound #1 Right,Proximal,Anterior Lower Leg o Return Appointment in 1 week. o Nurse Visit as needed Edema Control Wound #1 Right,Proximal,Anterior Lower Leg o Elevate legs to the level of  the heart and pump ankles as often as possible Additional Orders / Instructions Sandford, Kayanna C. ([id number]) Wound #1 Right,Proximal,Anterior Lower Leg o Stop Smoking o Increase protein intake. o Other: - Please try to keep blood sugars below 180 Electronic Signature(s) Signed: 11/21/2016 4:13:28 PM By: Evlyn Kanner MD, FACS Signed: 11/21/2016 4:30:01 PM By: Elliot Gurney RN, BSN, Kim RN, BSN Entered By: Elliot Gurney, RN, BSN, Kim on 11/21/2016 14:47:18 [name], [name] ([id number]) -------------------------------------------------------------------------------- Problem List Details [name] Date of Service: 11/21/2016 2:30 PM Medical Record Number: [id number] Patient Account Number: [id number] Date of Birth/Sex: 10-06-1945 (71 y.o. 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 Female) Treating RN: Huel Coventry Primary Care Provider: Milon Score Other Clinician: Referring Provider: Treating Provider/Extender: Rudene Re in Treatment: 9 Subjective Chief Complaint Information obtained from Patient patient is here for follow-up evaluation of a right lower extremity ulcer History of Present Illness (HPI) The following HPI elements were documented for the patient's wound: Location: right lower extremity in the area of the lateral calf below the knee Quality: Patient reports experiencing a sharp pain to affected area(s) but this is not occurring at this point Severity: Patient states wound are getting better but too slowly for her liking Duration: Patient has had the wound for < 4 weeks prior to presenting for treatment Timing: Pain in wound is intermittent and less frequent these days Context: The wound occurred when the patient a blunt injury and fall Modifying Factors: Other treatment(s) tried include:courses of doxycycline and local Bactroban ointment Associated Signs and Symptoms: Patient reports having increase discharge. 71 year old patient has been  referred to as with right lower extremity wound with edema and ecchymosis, after having a injury due to a fall on 08/20/2016. she has a history of diabetes mellitus type 2 which is uncomplicated, chronic back pain, COPD, hypertension, hypothyroidism, supraventricular tachycardia and tobacco abuse. He is status post neck surgery, appendectomy, cholecystectomy, hernia repair. He smokes about half a pack of cigarettes a day for the last 50 years. Recently on doxycycline for 10 days and was given symptomatic treatment for wound. 10/04/2016 -- the patient tolerated the snap vacuum system well and had no significant problems. She continues to smoke and is trying to give it up. 10/11/2016 -- she continues to tolerate the snap like him system fairly well and other than that she has no fresh issues 11/01/16- she is here for follow-up evaluation of her right lower extremity ulcer. She is tolerating the SNAP negative pressure system. She is voicing no complaints of pain. She states her glucose levels are consistently less than 150 with her evening glucose levels being higher than morning glucose levels. She continues to smoke, approximately 0.5-ppd which is a reduction of  1-ppd approximately 6 months ago. 11/08/16 patient on evaluation today fortunately does not appear to be having as much discomfort as she has in the past. She is tolerating the dressing changes well although she does note some irritation where the wound VAC has been applied. She specifically wonders if we could see about taking a break possibly progressing to discontinuation of the wound VAC. There is no evidence of infection. [name], [name] ([id number]) Objective Constitutional Pulse regular. Respirations normal and unlabored. Afebrile. Vitals Time Taken: 2:23 PM, Height: 67 in, Weight: 197 lbs, BMI: 30.9, Temperature: 97.9 F, Pulse: 75 bpm, Respiratory Rate: 16 breaths/min, Blood Pressure: 133/61 mmHg. Eyes Nonicteric. Reactive to  light. Ears, Nose, Mouth, and Throat Lips, teeth, and gums WNL.Marland Kitchen Moist mucosa without lesions. Neck supple and nontender. No palpable supraclavicular or cervical adenopathy. Normal sized without goiter. Respiratory WNL. No retractions.. Cardiovascular Pedal Pulses WNL. No clubbing, cyanosis or edema. Chest Breasts symmetical and no nipple discharge.. Breast tissue WNL, no masses, lumps, or tenderness.. Lymphatic No adneopathy. No adenopathy. No adenopathy. Musculoskeletal Adexa without tenderness or enlargement.. Digits and nails w/o clubbing, cyanosis, infection, petechiae, ischemia, or inflammatory conditions.Marland Kitchen Psychiatric Judgement and insight Intact.. No evidence of depression, anxiety, or agitation.. General Notes: I freshen the edges of the undermined part of the wound and remove some of subcutaneous debris and this time around we will do a pressure dressing and see if this reduction in dead space habits the wound to preclude the undermining Integumentary (Hair, Skin) No suspicious lesions. No crepitus or fluctuance. No peri-wound warmth or erythema. No masses.. Wound #1 status is Open. Original cause of wound was Trauma. The wound is located on the Right,Proximal,Anterior Lower Leg. The wound measures 2cm length x 1.3cm width x 0.1cm depth; [name], [name] ([id number]) 2.042cm^2 area and 0.204cm^3 volume. There is Fat Layer (Subcutaneous Tissue) Exposed exposed. There is undermining starting at 7:00 and ending at 11:00 with a maximum distance of 1.1cm. There is a medium amount of serous drainage noted. The wound margin is epibole. There is large (67-100%) red granulation within the wound bed. There is a small (1-33%) amount of necrotic tissue within the wound bed including Adherent Slough. The periwound skin appearance exhibited: Scarring, Ecchymosis. The periwound skin appearance did not exhibit: Callus, Crepitus, Excoriation, Induration, Rash, Dry/Scaly, Maceration, Atrophie  Blanche, Cyanosis, Hemosiderin Staining, Mottled, Pallor, Rubor, Erythema. Periwound temperature was noted as No Abnormality. The periwound has tenderness on palpation. Assessment Active Problems ICD-10 E11.622 - Type 2 diabetes mellitus with other skin ulcer M70.861 - Other soft tissue disorders related to use, overuse and pressure, right lower leg L97.212 - Non-pressure chronic ulcer of right calf with fat layer exposed F17.218 - Nicotine dependence, cigarettes, with other nicotine-induced disorders Procedures Wound #1 Wound #1 is a Trauma, Other located on the Right,Proximal,Anterior Lower Leg . There was a Skin/Subcutaneous Tissue Debridement ([id number]) debridement with total area of 2.6 sq cm performed by Evlyn Kanner, MD. with the following instrument(s): Curette to remove Viable and Non-Viable tissue/material including Fibrin/Slough and Subcutaneous after achieving pain control using Other (lidocaine 4%). A time out was conducted at 14:15, prior to the start of the procedure. A Minimum amount of bleeding was controlled with Pressure. The procedure was tolerated well with a pain level of 0 throughout and a pain level of 0 following the procedure. Post Debridement Measurements: 2cm length x 1.3cm width x 0.1cm depth; 0.204cm^3 volume. Character of Wound/Ulcer Post Debridement requires further debridement. Severity of Tissue Post Debridement is: Fat  layer exposed. Post procedure Diagnosis Wound #1: Same as Pre-Procedure Plan [name], [name] ([id number]) Wound Cleansing: Wound #1 Right,Proximal,Anterior Lower Leg: Clean wound with Normal Saline. May Shower, gently pat wound dry prior to applying new dressing. Anesthetic: Wound #1 Right,Proximal,Anterior Lower Leg: Topical Lidocaine 4% cream applied to wound bed prior to debridement Skin Barriers/Peri-Wound Care: Wound #1 Right,Proximal,Anterior Lower Leg: Skin Prep Primary Wound Dressing: Wound #1 Right,Proximal,Anterior  Lower Leg: Cutimed Sorbact - packed lightly into wound Secondary Dressing: Gauze and Kerlix/Conform Coban - for pressure Dressing Change Frequency: Wound #1 Right,Proximal,Anterior Lower Leg: Change dressing every other day. Follow-up Appointments: Wound #1 Right,Proximal,Anterior Lower Leg: Return Appointment in 1 week. Nurse Visit as needed Edema Control: Wound #1 Right,Proximal,Anterior Lower Leg: Elevate legs to the level of the heart and pump ankles as often as possible Additional Orders / Instructions: Wound #1 Right,Proximal,Anterior Lower Leg: Stop Smoking Increase protein intake. Other: - Please try to keep blood sugars below 180 After review and sharp debridement today, at this stage I have recommended placing a small piece of Sorbact on the wound and nothing under the skin flap and using a pressure dressing externally to try and close the dead space. She is encouraged to give up smoking and continue adequate protein and vitamin supplements Electronic Signature(s) Signed: 11/21/2016 3:06:15 PM By: Evlyn Kanner MD, FACS [name], [name] Kitchen ([id number]) Entered By: Evlyn Kanner on 11/21/2016 15:06:15 AVELYNN, SELLIN ([id number]) -------------------------------------------------------------------------------- SuperBill Details [name] Date of Service: 11/21/2016 Medical Record Number: [id number] Patient Account Number: [id number] Date of Birth/Sex: 1945/09/16 (71 y.o. Female) Treating RN: Huel Coventry Primary Care Provider: Milon Score Other Clinician: Referring Provider: Treating Provider/Extender: Rudene Re in Treatment: 9 Diagnosis Coding ICD-10 Codes Code Description E11.622 Type 2 diabetes mellitus with other skin ulcer M70.861 Other soft tissue disorders related to use, overuse and pressure, right lower leg L97.212 Non-pressure chronic ulcer of right calf with fat layer exposed F17.218 Nicotine dependence, cigarettes, with other  nicotine-induced disorders Facility Procedures CPT4: Description Modifier Quantity Code [id number] 11042 - DEB SUBQ TISSUE 20 SQ CM/< 1 ICD-10 Description Diagnosis E11.622 Type 2 diabetes mellitus with other skin ulcer M70.861 Other soft tissue disorders related to use, overuse and pressure, right  lower leg L97.212 Non-pressure chronic ulcer of right calf with fat layer exposed Physician Procedures CPT4: Description Modifier Quantity Code [id number] 11042 - WC PHYS SUBQ TISS 20 SQ CM 1 ICD-10 Description Diagnosis E11.622 Type 2 diabetes mellitus with other skin ulcer M70.861 Other soft tissue disorders related to use, overuse and pressure, right  lower leg L97.212 Non-pressure chronic ulcer of right calf with fat layer exposed Electronic Signature(s) Signed: 11/21/2016 3:06:37 PM By: Evlyn Kanner MD, FACS Entered By: Evlyn Kanner on 11/21/2016 15:06:37

## 2016-11-28 ENCOUNTER — Encounter: Payer: Medicare Other | Admitting: Surgery

## 2016-11-28 DIAGNOSIS — E11622 Type 2 diabetes mellitus with other skin ulcer: Secondary | ICD-10-CM | POA: Diagnosis not present

## 2016-11-30 NOTE — Progress Notes (Signed)
HIILEI, GERST ([id number]) Visit Report for 11/28/2016 Chief Complaint Document Details [name], [name] Date of Service: 11/28/2016 2:00 PM Medical Record Number: [id number] Patient Account Number: [id number] Date of Birth/Sex: 1945/12/19 (70 y.o. Female) Treating RN: Clover Mealy, RN, BSN, [place] Sink Primary Care Provider: Milon Score Other Clinician: Referring Provider: Treating Provider/Extender: Rudene Re in Treatment: 10 Information Obtained from: Patient Chief Complaint patient is here for follow-up evaluation of a right lower extremity ulcer Electronic Signature(s) Signed: 11/28/2016 2:22:23 PM By: Evlyn Kanner MD, FACS Entered By: Evlyn Kanner on 11/28/2016 14:22:22 [name] ([id number]) -------------------------------------------------------------------------------- HPI Details [name] Date of Service: 11/28/2016 2:00 PM Medical Record Number: [id number] Patient Account Number: [id number] Date of Birth/Sex: 25-Mar-1946 (70 y.o. Female) Treating RN: Afful, RN, BSN, [place] Sink Primary Care Provider: Milon Score Other Clinician: Referring Provider: Treating Provider/Extender: Rudene Re in Treatment: 10 History of Present Illness Location: right lower extremity in the area of the lateral calf below the knee Quality: Patient reports experiencing a sharp pain to affected area(s) but this is not occurring at this point Severity: Patient states wound are getting better but too slowly for her liking Duration: Patient has had the wound for < 4 weeks prior to presenting for treatment Timing: Pain in wound is intermittent and less frequent these days Context: The wound occurred when the patient a blunt injury and fall Modifying Factors: Other treatment(s) tried include:courses of doxycycline and local Bactroban ointment Associated Signs and Symptoms: Patient reports having increase discharge. HPI Description: 71 year old patient has  been referred to as with right lower extremity wound with edema and ecchymosis, after having a injury due to a fall on 08/20/2016. she has a history of diabetes mellitus type 2 which is uncomplicated, chronic back pain, COPD, hypertension, hypothyroidism, supraventricular tachycardia and tobacco abuse. He is status post neck surgery, appendectomy, cholecystectomy, hernia repair. He smokes about half a pack of cigarettes a day for the last 50 years. Recently on doxycycline for 10 days and was given symptomatic treatment for wound. 10/04/2016 -- the patient tolerated the snap vacuum system well and had no significant problems. She continues to smoke and is trying to give it up. 10/11/2016 -- she continues to tolerate the snap like him system fairly well and other than that she has no fresh issues 11/01/16- she is here for follow-up evaluation of her right lower extremity ulcer. She is tolerating the SNAP negative pressure system. She is voicing no complaints of pain. She states her glucose levels are consistently less than 150 with her evening glucose levels being higher than morning glucose levels. She continues to smoke, approximately 0.5-ppd which is a reduction of 1-ppd approximately 6 months ago. 11/08/16 patient on evaluation today fortunately does not appear to be having as much discomfort as she has in the past. She is tolerating the dressing changes well although she does note some irritation where the wound VAC has been applied. She specifically wonders if we could see about taking a break possibly progressing to discontinuation of the wound VAC. There is no evidence of infection. Electronic Signature(s) Signed: 11/28/2016 2:22:28 PM By: Evlyn Kanner MD, FACS Entered By: Evlyn Kanner on 11/28/2016 14:22:27 ROSHA, COCKER ([id number]) -------------------------------------------------------------------------------- Physical Exam Details [name] Date of Service:  11/28/2016 2:00 PM Medical Record Number: [id number] Patient Account Number: [id number] Date of Birth/Sex: 22-Sep-1945 (70 y.o. Female) Treating RN: Afful, RN, BSN, [place] Sink Primary Care Provider: Milon Score Other Clinician: Referring Provider:  Treating Provider/Extender: Evlyn KannerBritto, Shantrell Placzek Weeks in Treatment: 10 Constitutional . Pulse regular. Respirations normal and unlabored. Afebrile. . Eyes Nonicteric. Reactive to light. Ears, Nose, Mouth, and Throat Lips, teeth, and gums WNL.Marland Kitchen. Moist mucosa without lesions. Neck supple and nontender. No palpable supraclavicular or cervical adenopathy. Normal sized without goiter. Respiratory WNL. No retractions.. Cardiovascular Pedal Pulses WNL. No clubbing, cyanosis or edema. Lymphatic No adneopathy. No adenopathy. No adenopathy. Musculoskeletal Adexa without tenderness or enlargement.. Digits and nails w/o clubbing, cyanosis, infection, petechiae, ischemia, or inflammatory conditions.. Integumentary (Hair, Skin) No suspicious lesions. No crepitus or fluctuance. No peri-wound warmth or erythema. No masses.Marland Kitchen. Psychiatric Judgement and insight Intact.. No evidence of depression, anxiety, or agitation.. Notes the wound continues to have undermining between the 6 and 12:00 position and I have freshen the edges again today and used a tooth forcep to clean out some of the superficial debris. No sharp debridement was done today Electronic Signature(s) Signed: 11/28/2016 2:23:24 PM By: Evlyn KannerBritto, Corra Kaine MD, FACS Entered By: Evlyn KannerBritto, Nishawn Rotan on 11/28/2016 14:23:23 [name], [name] ([id number]) -------------------------------------------------------------------------------- Physician Orders Details [name], [name] Date of Service: 11/28/2016 2:00 PM Medical Record Number: [id number] Patient Account Number: [id number] Date of Birth/Sex: Mar 07, 1946 (70 y.o. Female) Treating RN: Clover MealyAfful, RN, BSN, [place] Sinkita Primary Care Provider: Milon ScoreJONES, CARON Other  Clinician: Referring Provider: Treating Provider/Extender: Rudene ReBritto, Franchot Pollitt Weeks in Treatment: 10 Verbal / Phone Orders: No Diagnosis Coding Wound Cleansing Wound #1 Right,Proximal,Anterior Lower Leg o Clean wound with Normal Saline. o May Shower, gently pat wound dry prior to applying new dressing. Anesthetic Wound #1 Right,Proximal,Anterior Lower Leg o Topical Lidocaine 4% cream applied to wound bed prior to debridement Skin Barriers/Peri-Wound Care Wound #1 Right,Proximal,Anterior Lower Leg o Skin Prep Primary Wound Dressing Wound #1 Right,Proximal,Anterior Lower Leg o Cutimed Sorbact Secondary Dressing o Gauze and Kerlix/Conform o Coban - for pressure Dressing Change Frequency Wound #1 Right,Proximal,Anterior Lower Leg o Change dressing every other day. Follow-up Appointments Wound #1 Right,Proximal,Anterior Lower Leg o Return Appointment in 1 week. Edema Control Wound #1 Right,Proximal,Anterior Lower Leg o Elevate legs to the level of the heart and pump ankles as often as possible Additional Orders / Instructions Wound #1 Right,Proximal,Anterior Lower Leg Graefe, Hatley C. ([id number]) o Stop Smoking o Increase protein intake. o Other: - Please try to keep blood sugars below 180 Electronic Signature(s) Signed: 11/28/2016 4:13:25 PM By: Evlyn KannerBritto, Berenice Oehlert MD, FACS Signed: 11/28/2016 4:57:34 PM By: Elpidio EricAfful, Rita BSN, RN Entered By: Elpidio EricAfful, Rita on 11/28/2016 14:15:35 [name], [name] ([id number]) -------------------------------------------------------------------------------- Problem List Details [name], [name] Date of Service: 11/28/2016 2:00 PM Medical Record Number: [id number] Patient Account Number: [id number] Date of Birth/Sex: Mar 07, 1946 (70 y.o. 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 ([id number]) -------------------------------------------------------------------------------- Progress Note Details [name], [name] Date of Service: 11/28/2016 2:00 PM Medical Record Number: [id number] Patient Account Number: [id number] Date of Birth/Sex: Mar 07, 1946 (70 y.o. Female) Treating  RN: Clover Mealy, RN, BSN, Rita Primary Care Provider: Milon Score Other Clinician: Referring Provider: Treating Provider/Extender: Rudene Re in Treatment: 10 Subjective Chief Complaint Information obtained from Patient patient is here for follow-up evaluation of a right lower extremity ulcer History of Present Illness (HPI) The following HPI elements were documented for the patient's wound: Location: right lower extremity in the area of the lateral calf below the knee Quality: Patient reports experiencing a sharp pain to affected area(s) but this is not occurring at this point Severity: Patient states wound are getting better but too slowly for her liking Duration: Patient has had the wound for < 4 weeks prior to presenting for treatment Timing: Pain in wound is intermittent and less frequent these days Context: The wound occurred when the patient a blunt injury and fall Modifying Factors: Other treatment(s) tried include:courses of doxycycline and local Bactroban ointment Associated  Signs and Symptoms: Patient reports having increase discharge. 71 year old patient has been referred to as with right lower extremity wound with edema and ecchymosis, after having a injury due to a fall on 08/20/2016. she has a history of diabetes mellitus type 2 which is uncomplicated, chronic back pain, COPD, hypertension, hypothyroidism, supraventricular tachycardia and tobacco abuse. He is status post neck surgery, appendectomy, cholecystectomy, hernia repair. He smokes about half a pack of cigarettes a day for the last 50 years. Recently on doxycycline for 10 days and was given symptomatic treatment for wound. 10/04/2016 -- the patient tolerated the snap vacuum system well and had no significant problems. She continues to smoke and is trying to give it up. 10/11/2016 -- she continues to tolerate the snap like him system fairly well and other than that she has no fresh issues 11/01/16- she is here for follow-up evaluation of her right lower extremity ulcer. She is tolerating the SNAP negative pressure system. She is voicing no complaints of pain. She states her glucose levels are consistently less than 150 with her evening glucose levels being higher than morning glucose levels. She continues to smoke, approximately 0.5-ppd which is a reduction of 1-ppd approximately 6 months ago. 11/08/16 patient on evaluation today fortunately does not appear to be having as much discomfort as she has in the past. She is tolerating the dressing changes well although she does note some irritation where the wound VAC has been applied. She specifically wonders if we could see about taking a break possibly progressing to discontinuation of the wound VAC. There is no evidence of infection. [name], [name] ([id number]) Objective Constitutional Pulse regular. Respirations normal and unlabored. Afebrile. Vitals Time Taken: 2:03 PM, Height: 67 in, Weight: 197 lbs, BMI: 30.9, Temperature: 97.9 F, Pulse: 74 bpm,  Respiratory Rate: 16 breaths/min, Blood Pressure: 143/66 mmHg. Eyes Nonicteric. Reactive to light. Ears, Nose, Mouth, and Throat Lips, teeth, and gums WNL.Marland Kitchen Moist mucosa without lesions. Neck supple and nontender. No palpable supraclavicular or cervical adenopathy. Normal sized without goiter. Respiratory WNL. No retractions.. Cardiovascular Pedal Pulses WNL. No clubbing, cyanosis or edema. Lymphatic No adneopathy. No adenopathy. No adenopathy. Musculoskeletal Adexa without tenderness or enlargement.. Digits and nails w/o clubbing, cyanosis, infection, petechiae, ischemia, or inflammatory conditions.Marland Kitchen Psychiatric Judgement and insight Intact.. No evidence of depression, anxiety, or agitation.. General Notes: the wound continues to have undermining between the 6 and 12:00 position and I have freshen the edges again today and used a tooth forcep to clean out some of the superficial debris. No sharp debridement was done today Integumentary (Hair, Skin) No suspicious lesions.  No crepitus or fluctuance. No peri-wound warmth or erythema. No masses.. Wound #1 status is Open. Original cause of wound was Trauma. The wound is located on the Right,Proximal,Anterior Lower Leg. The wound measures 1.9cm length x 0.8cm width x 0.1cm depth; 1.194cm^2 area and 0.119cm^3 volume. There is Fat Layer (Subcutaneous Tissue) Exposed exposed. There is no tunneling or undermining noted. There is a medium amount of serous drainage noted. The wound margin is epibole. There is large (67-100%) red granulation within the wound bed. There is no [name], [name] ([id number]) necrotic tissue within the wound bed. The periwound skin appearance exhibited: Scarring, Ecchymosis. The periwound skin appearance did not exhibit: Callus, Crepitus, Excoriation, Induration, Rash, Dry/Scaly, Maceration, Atrophie Blanche, Cyanosis, Hemosiderin Staining, Mottled, Pallor, Rubor, Erythema. Periwound temperature was noted as No  Abnormality. The periwound has tenderness on palpation. 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 May Shower, gently pat wound dry prior to applying new dressing. Anesthetic: Wound #1 Right,Proximal,Anterior Lower Leg: Topical Lidocaine 4% cream applied to wound bed prior to debridement Skin Barriers/Peri-Wound Care: Wound #1 Right,Proximal,Anterior Lower Leg: Skin Prep Primary Wound Dressing: Wound #1 Right,Proximal,Anterior Lower Leg: Cutimed Sorbact Secondary Dressing: Gauze and Kerlix/Conform Coban - for pressure Dressing Change Frequency: Wound #1 Right,Proximal,Anterior Lower Leg: Change dressing every other day. Follow-up Appointments: Wound #1 Right,Proximal,Anterior Lower Leg: Return Appointment in 1 week. Edema Control: Wound #1 Right,Proximal,Anterior Lower Leg: Elevate legs to the level of the heart and pump ankles as often as possible Goyal, Likisha C. ([id number]) Additional Orders / Instructions: Wound #1 Right,Proximal,Anterior Lower Leg: Stop Smoking Increase protein intake. Other: - Please try to keep blood sugars below 180 We have run her insurance coverage for [employer] and I understand she may be eligible for this. After review today, at this stage I have recommended placing a small piece of Sorbact on the wound and nothing under the skin flap and using a pressure dressing externally to try and close the dead space. if she does not improve significantly by next week we will go ahead with application of a skin substitute She is encouraged to give up smoking and continue  adequate protein and vitamin supplements Electronic Signature(s) Signed: 11/28/2016 2:25:23 PM By: Evlyn Kanner MD, FACS Entered By: Evlyn Kanner on 11/28/2016 14:25:23 [name] ([id number]) -------------------------------------------------------------------------------- SuperBill Details [name] Date of Service: 11/28/2016 Medical Record Number: [id number] Patient Account Number: [id number] Date of Birth/Sex: 1946-02-24 (70 y.o. Female) Treating RN: Afful, RN, BSN, Rita Primary Care Provider: Milon Score Other Clinician: Referring Provider: Treating Provider/Extender: Rudene Re in Treatment: 10 Diagnosis Coding ICD-10 Codes Code Description E11.622 Type 2 diabetes mellitus with other skin ulcer M70.861 Other soft tissue disorders related to use, overuse and pressure, right lower leg L97.212 Non-pressure chronic ulcer of right calf with fat layer exposed F17.218 Nicotine dependence, cigarettes, with other nicotine-induced disorders Facility Procedures CPT4 Code: [id number] Description: 44034 - WOUND CARE VISIT-LEV 2 EST PT Modifier: Quantity: 1 Physician Procedures CPT4: Description Modifier Quantity Code [id number] - WC PHYS LEVEL 3 - EST PT 1 ICD-10 Description Diagnosis E11.622 Type 2 diabetes mellitus with other skin ulcer M70.861 Other soft tissue disorders related to  use, overuse and pressure, right lower  leg L97.212 Non-pressure chronic ulcer of right calf with fat layer exposed F17.218 Nicotine dependence, cigarettes, with other nicotine-induced disorders Electronic Signature(s) Signed: 11/28/2016 5:07:31 PM By: Elliot Gurney RN, BSN, Kim RN, BSN Signed: 11/29/2016 4:04:46 PM By: Evlyn Kanner MD, FACS Previous Signature: 11/28/2016 3:30:59 PM Version By: Evlyn Kanner MD, FACS Entered By: Elliot Gurney RN, BSN, Kim on 11/28/2016 17:07:31

## 2016-11-30 NOTE — Progress Notes (Addendum)
LAURIANNE, FLORESCA ([id number]) Visit Report for 11/28/2016 Arrival Information Details [name], [name] Date of Service: 11/28/2016 2:00 PM Medical Record Number: [id number] Patient Account Number: [id number] Date of Birth/Sex: 19-Jan-1946 (71 y.o. Female) Treating RN: Afful, RN, BSN, [place] Sink Primary Care Ammarie Matsuura: Milon Score Other Clinician: Referring Jahson Emanuele: Treating Yoselin Amerman/Extender: Rudene Re in Treatment: 10 Visit Information History Since Last Visit All ordered tests and consults were completed: No Patient Arrived: Ambulatory Added or deleted any medications: No Arrival Time: 14:01 Any new allergies or adverse reactions: No Accompanied By: son Had a fall or experienced change in No Transfer Assistance: None activities of daily living that may affect Patient Identification Verified: Yes risk of falls: Secondary Verification Process Yes Signs or symptoms of abuse/neglect since last No Completed: visito Patient Requires Transmission-Based No Hospitalized since last visit: No Precautions: Has Dressing in Place as Prescribed: Yes Patient Has Alerts: Yes Pain Present Now: No Patient Alerts: DMII Electronic Signature(s) Signed: 11/28/2016 4:57:34 PM By: Elpidio Eric BSN, RN Entered By: Elpidio Eric on 11/28/2016 14:03:35 [name] ([id number]) -------------------------------------------------------------------------------- Clinic Level of Care Assessment Details [name] Date of Service: 11/28/2016 2:00 PM Medical Record Number: [id number] Patient Account Number: [id number] Date of Birth/Sex: 09-Dec-1945 (71 y.o. Female) Treating RN: Afful, RN, BSN, [place] Sink Primary Care Izzac Rockett: Milon Score Other Clinician: Referring Benito Lemmerman: Treating Signora Zucco/Extender: Rudene Re in Treatment: 10 Clinic Level of Care Assessment Items TOOL 4 Quantity Score []  - Use when only an EandM is performed on FOLLOW-UP visit 0 ASSESSMENTS -  Nursing Assessment / Reassessment X - Reassessment of Co-morbidities (includes updates in patient status) 1 10 X - Reassessment of Adherence to Treatment Plan 1 5 ASSESSMENTS - Wound and Skin Assessment / Reassessment X - Simple Wound Assessment / Reassessment - one wound 1 5 []  - Complex Wound Assessment / Reassessment - multiple wounds 0 []  - Dermatologic / Skin Assessment (not related to wound area) 0 ASSESSMENTS - Focused Assessment []  - Circumferential Edema Measurements - multi extremities 0 []  - Nutritional Assessment / Counseling / Intervention 0 X - Lower Extremity Assessment (monofilament, tuning fork, pulses) 1 5 []  - Peripheral Arterial Disease Assessment (using hand held doppler) 0 ASSESSMENTS - Ostomy and/or Continence Assessment and Care []  - Incontinence Assessment and Management 0 []  - Ostomy Care Assessment and Management (repouching, etc.) 0 PROCESS - Coordination of Care X - Simple Patient / Family Education for ongoing care 1 15 []  - Complex (extensive) Patient / Family Education for ongoing care 0 []  - Staff obtains Chiropractor, Records, Test Results / Process Orders 0 []  - Staff telephones HHA, Nursing Homes / Clarify orders / etc 0 []  - Routine Transfer to another Facility (non-emergent condition) 0 [name], [name] ([id number]) []  - Routine Hospital Admission (non-emergent condition) 0 []  - New Admissions / Manufacturing engineer / Ordering NPWT, Apligraf, etc. 0 []  - Emergency Hospital Admission (emergent condition) 0 []  - Simple Discharge Coordination 0 []  - Complex (extensive) Discharge Coordination 0 PROCESS - Special Needs []  - Pediatric / Minor Patient Management 0 []  - Isolation Patient Management 0 []  - Hearing / Language / Visual special needs 0 []  - Assessment of Community assistance (transportation, D/C planning, etc.) 0 []  - Additional assistance / Altered mentation 0 []  - Support Surface(s) Assessment (bed, cushion, seat, etc.) 0 INTERVENTIONS -  Wound Cleansing / Measurement X - Simple Wound Cleansing - one wound 1 5 []  - Complex Wound Cleansing - multiple wounds 0 X -  Wound Imaging (photographs - any number of wounds) 1 5 []  - Wound Tracing (instead of photographs) 0 []  - Simple Wound Measurement - one wound 0 []  - Complex Wound Measurement - multiple wounds 0 INTERVENTIONS - Wound Dressings X - Small Wound Dressing one or multiple wounds 1 10 []  - Medium Wound Dressing one or multiple wounds 0 []  - Large Wound Dressing one or multiple wounds 0 []  - Application of Medications - topical 0 []  - Application of Medications - injection 0 INTERVENTIONS - Miscellaneous []  - External ear exam 0 [name], [name] ([id number]) []  - Specimen Collection (cultures, biopsies, blood, body fluids, etc.) 0 []  - Specimen(s) / Culture(s) sent or taken to Lab for analysis 0 []  - Patient Transfer (multiple staff / Michiel Sites Lift / Similar devices) 0 []  - Simple Staple / Suture removal (25 or less) 0 []  - Complex Staple / Suture removal (26 or more) 0 []  - Hypo / Hyperglycemic Management (close monitor of Blood Glucose) 0 []  - Ankle / Brachial Index (ABI) - do not check if billed separately 0 X - Vital Signs 1 5 Has the patient been seen at the hospital within the last three years: Yes Total Score: 65 Level Of Care: New/Established - Level 2 Electronic Signature(s) Signed: 11/29/2016 1:22:14 PM By: Elpidio Eric BSN, RN Entered By: Elpidio Eric on 11/28/2016 16:59:03 [name] ([id number]) -------------------------------------------------------------------------------- Encounter Discharge Information Details [name] Date of Service: 11/28/2016 2:00 PM Medical Record Number: [id number] Patient Account Number: [id number] Date of Birth/Sex: [birth year] (71 y.o. Female) Treating RN: Afful, RN, BSN, [place] Sink Primary Care Victormanuel Mclure: Milon Score Other Clinician: Referring Doranne Schmutz: Treating Kristan Brummitt/Extender: Rudene Re in  Treatment: 10 Encounter Discharge Information Items Discharge Pain Level: 0 Discharge Condition: Stable Ambulatory Status: Ambulatory Discharge Destination: Home Transportation: Private Auto Accompanied By: son Schedule Follow-up Appointment: No Medication Reconciliation completed and provided to Patient/Care No [name]: Provided on Clinical Summary of Care: 11/28/2016 Form Type Recipient Paper Patient ML Electronic Signature(s) Signed: 11/28/2016 5:00:02 PM By: Elpidio Eric BSN, RN Previous Signature: 11/28/2016 2:24:44 PM Version By: Gwenlyn Perking Entered By: Elpidio Eric on 11/28/2016 17:00:02 [name] ([id number]) -------------------------------------------------------------------------------- Lower Extremity Assessment Details [name] Date of Service: 11/28/2016 2:00 PM Medical Record Number: [id number] Patient Account Number: [id number] Date of Birth/Sex: 12-Mar-1946 (71 y.o. Female) Treating RN: Afful, RN, BSN, [place] Sink Primary Care Pearle Wandler: Milon Score Other Clinician: Referring Shania Bjelland: Treating Lorenzo Pereyra/Extender: Rudene Re in Treatment: 10 Edema Assessment Assessed: [Left: No] [Right: No] Edema: [Left: N] [Right: o] Vascular Assessment Claudication: Claudication Assessment [Right:None] Pulses: Dorsalis Pedis Palpable: [Right:Yes] Posterior Tibial Extremity colors, hair growth, and conditions: Extremity Color: [Right:Normal] Hair Growth on Extremity: [Right:Yes] Temperature of Extremity: [Right:Warm] Capillary Refill: [Right:< 3 seconds] Toe Nail Assessment Left: Right: Thick: Yes Discolored: No Deformed: No Improper Length and Hygiene: No Electronic Signature(s) Signed: 11/28/2016 4:57:34 PM By: Elpidio Eric BSN, RN Entered By: Elpidio Eric on 11/28/2016 14:11:41 [name] ([id number]) -------------------------------------------------------------------------------- Multi Wound Chart Details [name] Date of Service: 11/28/2016 2:00 PM Medical Record Number: [id number] Patient Account Number: [id number] Date of Birth/Sex: 1946/05/09 (71 y.o. Female) Treating RN: Clover Mealy, RN, BSN, [place] Sink Primary Care Dontae Minerva: Milon Score Other Clinician: Referring Mariajose Mow: Treating Akya Fiorello/Extender: Rudene Re in Treatment: 10 Vital Signs Height(in): 67 Pulse(bpm): 74 Weight(lbs): 197 Blood Pressure 143/66 (mmHg): Body Mass Index(BMI): 31 Temperature(F): 97.9 Respiratory Rate 16 (breaths/min): Photos: [1:No Photos] [N/A:N/A] Wound Location: [1:Right Lower Leg -  Anterior, Proximal] [N/A:N/A] Wounding Event: [1:Trauma] [N/A:N/A] Primary Etiology: [1:Trauma, Other] [N/A:N/A] Comorbid History: [1:Chronic Obstructive Pulmonary Disease (COPD), Hypertension, Type II Diabetes] [N/A:N/A] Date Acquired: [1:08/17/2016] [N/A:N/A] Weeks of Treatment: [1:10] [N/A:N/A] Wound Status: [1:Open] [N/A:N/A] Measurements L x W x D 1.9x0.8x0.1 [N/A:N/A] (cm) Area (cm) : [1:1.194] [N/A:N/A] Volume (cm) : [1:0.119] [N/A:N/A] % Reduction in Area: [1:67.30%] [N/A:N/A] % Reduction in Volume: 67.40% [N/A:N/A] Classification: [1:Full Thickness Without Exposed Support Structures] [N/A:N/A] HBO Classification: [1:Grade 1] [N/A:N/A] Exudate Amount: [1:Medium] [N/A:N/A] Exudate Type: [1:Serous] [N/A:N/A] Exudate Color: [1:amber] [N/A:N/A] Wound Margin: [1:Epibole] [N/A:N/A] Granulation Amount: [1:Large (67-100%)] [N/A:N/A] Granulation Quality: [1:Red, Hyper-granulation] [N/A:N/A] Necrotic Amount: [1:None Present (0%)] [N/A:N/A] Exposed Structures: [N/A:N/A] Fat Layer (Subcutaneous Tissue) Exposed: Yes Fascia: No Tendon: No Muscle: No Joint: No Bone: No Epithelialization: Small (1-33%) N/A N/A Periwound Skin Texture: Scarring: Yes N/A N/A Excoriation: No Induration: No Callus: No Crepitus: No Rash: No Periwound Skin Maceration: No N/A N/A Moisture: Dry/Scaly: No Periwound Skin Color:  Ecchymosis: Yes N/A N/A Atrophie Blanche: No Cyanosis: No Erythema: No Hemosiderin Staining: No Mottled: No Pallor: No Rubor: No Temperature: No Abnormality N/A N/A Tenderness on Yes N/A N/A Palpation: Wound Preparation: Ulcer Cleansing: N/A N/A Rinsed/Irrigated with Saline Topical Anesthetic Applied: Other: lidocaine 4% Treatment Notes Electronic Signature(s) Signed: 11/28/2016 2:22:14 PM By: Evlyn Kanner MD, FACS Entered By: Evlyn Kanner on 11/28/2016 14:22:13 [name], [name] ([id number]) -------------------------------------------------------------------------------- Multi-Disciplinary Care Plan Details [name] Date of Service: 11/28/2016 2:00 PM Medical Record Number: [id number] Patient Account Number: [id number] Date of Birth/Sex: 04-29-1946 (71 y.o. Female) Treating RN: Afful, RN, BSN, [place] Sink Primary Care Immaculate Crutcher: Milon Score Other Clinician: Referring Remee Charley: Treating Brae Gartman/Extender: Rudene Re in Treatment: 10 Active Inactive ` Necrotic Tissue Nursing Diagnoses: Impaired tissue integrity related to necrotic/devitalized tissue Goals: Necrotic/devitalized tissue will be minimized in the wound bed Date Initiated: 09/13/2016 Target Resolution Date: 10/18/2016 Goal Status: Active Interventions: Assess patient pain level pre-, during and post procedure and prior to discharge Notes: ` Orientation to the Wound Care Program Nursing Diagnoses: Knowledge deficit related to the wound healing center program Goals: Patient/caregiver will verbalize understanding of the Wound Healing Center Program Date Initiated: 09/13/2016 Target Resolution Date: 12/20/2016 Goal Status: Active Interventions: Provide education on orientation to the wound center Notes: ` Wound/Skin Impairment Nursing Diagnoses: Impaired tissue integrity [name], [name] ([id number]) Goals: Patient/caregiver will verbalize understanding of skin care regimen Date  Initiated: 09/13/2016 Target Resolution Date: 12/20/2016 Goal Status: Active Ulcer/skin breakdown will have a volume reduction of 30% by week 4 Date Initiated: 09/13/2016 Target Resolution Date: 12/20/2016 Goal Status: Active Ulcer/skin breakdown will have a volume reduction of 50% by week 8 Date Initiated: 09/13/2016 Target Resolution Date: 12/20/2016 Goal Status: Active Ulcer/skin breakdown will have a volume reduction of 80% by week 12 Date Initiated: 09/13/2016 Target Resolution Date: 12/20/2016 Goal Status: Active Ulcer/skin breakdown will heal within 14 weeks Date Initiated: 09/13/2016 Target Resolution Date: 12/20/2016 Goal Status: Active Interventions: Assess patient/caregiver ability to obtain necessary supplies Assess patient/caregiver ability to perform ulcer/skin care regimen upon admission and as needed Assess ulceration(s) every visit Notes: Electronic Signature(s) Signed: 11/28/2016 4:57:34 PM By: Elpidio Eric BSN, RN Entered By: Elpidio Eric on 11/28/2016 14:11:54 [name] ([id number]) -------------------------------------------------------------------------------- Pain Assessment Details [name] Date of Service: 11/28/2016 2:00 PM Medical Record Number: [id number] Patient Account Number: [id number] Date of Birth/Sex: [birth year] (71 y.o. Female) Treating RN: Afful, RN, BSN, [place] Sink Primary Care Jazziel Fitzsimmons: Milon Score Other Clinician: Referring Taesean Reth: Treating Maridel Pixler/Extender: Meyer Russel,  Errol Weeks in Treatment: 10 Active Problems Location of Pain Severity and Description of Pain Patient Has Paino No Site Locations With Dressing Change: No Pain Management and Medication Current Pain Management: Electronic Signature(s) Signed: 11/28/2016 4:57:34 PM By: Elpidio Eric BSN, RN Entered By: Elpidio Eric on 11/28/2016 14:03:57 [name] ([id number]) -------------------------------------------------------------------------------- Patient/Caregiver  Education Details [name] Date of Service: 11/28/2016 2:00 PM Medical Record Number: [id number] Patient Account Number: [id number] Date of Birth/Gender: 30-Aug-1945 (71 y.o. Female) Treating RN: Clover Mealy, RN, BSN, [place] Sink Primary Care Physician: Milon Score Other Clinician: Referring Physician: Treating Physician/Extender: Rudene Re in Treatment: 10 Education Assessment Education Provided To: Patient Education Topics Provided Welcome To The Wound Care Center: Methods: Explain/Verbal Responses: State content correctly Electronic Signature(s) Signed: 11/29/2016 1:22:14 PM By: Elpidio Eric BSN, RN Entered By: Elpidio Eric on 11/28/2016 17:00:12 [name] ([id number]) -------------------------------------------------------------------------------- Wound Assessment Details [name] Date of Service: 11/28/2016 2:00 PM Medical Record Number: [id number] Patient Account Number: [id number] Date of Birth/Sex: 1946/05/18 (71 y.o. Female) Treating RN: Afful, RN, BSN, [name] Primary Care Natassja Ollis: Milon Score Other Clinician: Referring Boluwatife Mutchler: Treating Georgiann Neider/Extender: Rudene Re in Treatment: 10 Wound Status Wound Number: 1 Primary Trauma, Other Etiology: Wound Location: Right Lower Leg - Anterior, Proximal Wound Open Status: Wounding Event: Trauma Comorbid Chronic Obstructive Pulmonary Date Acquired: 08/17/2016 History: Disease (COPD), Hypertension, Type Weeks Of Treatment: 10 II Diabetes Clustered Wound: No Photos Photo Uploaded By: Elpidio Eric on 11/28/2016 15:44:08 Wound Measurements Length: (cm) 1.9 Width: (cm) 0.8 Depth: (cm) 0.1 Area: (cm) 1.194 Volume: (cm) 0.119 % Reduction in Area: 67.3% % Reduction in Volume: 67.4% Epithelialization: Small (1-33%) Tunneling: No Undermining: No Wound Description Full Thickness Without Foul Odor Janet Classification: Exposed Support Structures Slough/Fibrin Diabetic  Severity Grade 1 (Wagner): Wound Margin: Epibole Exudate Amount: Medium Exudate Type: Serous Exudate Color: amber er Cleansing: No o No Wound Bed Granulation Amount: Large (67-100%) Exposed Structure [name], [name] ([id number]) Granulation Quality: Red, Hyper-granulation Fascia Exposed: No Necrotic Amount: None Present (0%) Fat Layer (Subcutaneous Tissue) Exposed: Yes Tendon Exposed: No Muscle Exposed: No Joint Exposed: No Bone Exposed: No Periwound Skin Texture Texture Color No Abnormalities Noted: No No Abnormalities Noted: No Callus: No Atrophie Blanche: No Crepitus: No Cyanosis: No Excoriation: No Ecchymosis: Yes Induration: No Erythema: No Rash: No Hemosiderin Staining: No Scarring: Yes Mottled: No Pallor: No Moisture Rubor: No No Abnormalities Noted: No Dry / Scaly: No Temperature / Pain Maceration: No Temperature: No Abnormality Tenderness on Palpation: Yes Wound Preparation Ulcer Cleansing: Rinsed/Irrigated with Saline Topical Anesthetic Applied: Other: lidocaine 4%, Treatment Notes Wound #1 (Right, Proximal, Anterior Lower Leg) 1. Cleansed with: Clean wound with Normal Saline 4. Dressing Applied: Other dressing (specify in notes) 5. Secondary Dressing Applied Dry Gauze Kerlix/Conform 7. Secured with Other (specify in notes) Notes Sorbact, Gauze to bolster, Coban for pressure Electronic Signature(s) Signed: 11/28/2016 4:57:34 PM By: Elpidio Eric BSN, RN Entered By: Elpidio Eric on 11/28/2016 14:07:45 [name] ([id number]) -------------------------------------------------------------------------------- Vitals Details [name] Date of Service: 11/28/2016 2:00 PM Medical Record Number: [id number] Patient Account Number: [id number] Date of Birth/Sex: 06/15/1946 (71 y.o. Female) Treating RN: Afful, RN, BSN, [place] Sink Primary Care Barak Bialecki: Milon Score Other Clinician: Referring Bartlett Enke: Treating Maddyson Keil/Extender:  Rudene Re in Treatment: 10 Vital Signs Time Taken: 14:03 Temperature (F): 97.9 Height (in): 67 Pulse (bpm): 74 Weight (lbs): 197 Respiratory Rate (breaths/min): 16 Body Mass Index (BMI): 30.9 Blood Pressure (mmHg): 143/66 Reference Range:  80 - 120 mg / dl Electronic Signature(s) Signed: 11/28/2016 4:57:34 PM By: Elpidio EricAfful, [name] BSN, RN Entered By: Elpidio EricAfful, [name] on 11/28/2016 14:04:15

## 2016-12-06 ENCOUNTER — Encounter: Payer: Medicare Other | Admitting: Surgery

## 2016-12-06 DIAGNOSIS — E11622 Type 2 diabetes mellitus with other skin ulcer: Secondary | ICD-10-CM | POA: Diagnosis not present

## 2016-12-08 NOTE — Progress Notes (Signed)
CALLY, NYGARD ([id number]) Visit Report for 12/06/2016 Arrival Information Details [name], [name] Date of Service: 12/06/2016 3:30 PM Medical Record Number: [id number] Patient Account Number: [id number] Date of Birth/Sex: 29-Jan-1946 (71 y.o. Female) Treating RN: Curtis Sites Primary Care Harman Ferrin: Milon Score Other Clinician: Referring Shavonte Zhao: Treating Kaoru Benda/Extender: Rudene Re in Treatment: 12 Visit Information History Since Last Visit Added or deleted any medications: No Patient Arrived: Ambulatory Any new allergies or adverse reactions: No Arrival Time: 15:16 Had a fall or experienced change in No Accompanied By: self activities of daily living that may affect Transfer Assistance: None risk of falls: Patient Identification Verified: Yes Signs or symptoms of abuse/neglect since last No Secondary Verification Process Yes visito Completed: Hospitalized since last visit: No Patient Requires Transmission-Based No Has Dressing in Place as Prescribed: Yes Precautions: Pain Present Now: No Patient Has Alerts: Yes Patient Alerts: DMII Electronic Signature(s) Signed: 12/06/2016 3:54:46 PM By: Curtis Sites Entered By: Curtis Sites on 12/06/2016 15:16:37 [name] ([id number]) -------------------------------------------------------------------------------- Clinic Level of Care Assessment Details [name] Date of Service: 12/06/2016 3:30 PM Medical Record Number: [id number] Patient Account Number: [id number] Date of Birth/Sex: 1946-07-15 (71 y.o. Female) Treating RN: Curtis Sites Primary Care Akbar Sacra: Milon Score Other Clinician: Referring Dervin Vore: Treating Sallie Staron/Extender: Rudene Re in Treatment: 12 Clinic Level of Care Assessment Items TOOL 4 Quantity Score []  - Use when only an EandM is performed on FOLLOW-UP visit 0 ASSESSMENTS - Nursing Assessment / Reassessment X - Reassessment of  Co-morbidities (includes updates in patient status) 1 10 X - Reassessment of Adherence to Treatment Plan 1 5 ASSESSMENTS - Wound and Skin Assessment / Reassessment X - Simple Wound Assessment / Reassessment - one wound 1 5 []  - Complex Wound Assessment / Reassessment - multiple wounds 0 []  - Dermatologic / Skin Assessment (not related to wound area) 0 ASSESSMENTS - Focused Assessment []  - Circumferential Edema Measurements - multi extremities 0 []  - Nutritional Assessment / Counseling / Intervention 0 X - Lower Extremity Assessment (monofilament, tuning fork, pulses) 1 5 []  - Peripheral Arterial Disease Assessment (using hand held doppler) 0 ASSESSMENTS - Ostomy and/or Continence Assessment and Care []  - Incontinence Assessment and Management 0 []  - Ostomy Care Assessment and Management (repouching, etc.) 0 PROCESS - Coordination of Care X - Simple Patient / Family Education for ongoing care 1 15 []  - Complex (extensive) Patient / Family Education for ongoing care 0 []  - Staff obtains Chiropractor, Records, Test Results / Process Orders 0 []  - Staff telephones HHA, Nursing Homes / Clarify orders / etc 0 []  - Routine Transfer to another Facility (non-emergent condition) 0 Wrench, Ludean C. ([id number]) []  - Routine Hospital Admission (non-emergent condition) 0 []  - New Admissions / Manufacturing engineer / Ordering NPWT, Apligraf, etc. 0 []  - Emergency Hospital Admission (emergent condition) 0 X - Simple Discharge Coordination 1 10 []  - Complex (extensive) Discharge Coordination 0 PROCESS - Special Needs []  - Pediatric / Minor Patient Management 0 []  - Isolation Patient Management 0 []  - Hearing / Language / Visual special needs 0 []  - Assessment of Community assistance (transportation, D/C planning, etc.) 0 []  - Additional assistance / Altered mentation 0 []  - Support Surface(s) Assessment (bed, cushion, seat, etc.) 0 INTERVENTIONS - Wound Cleansing / Measurement X - Simple Wound  Cleansing - one wound 1 5 []  - Complex Wound Cleansing - multiple wounds 0 X - Wound Imaging (photographs - any number of wounds) 1 5 []  - Wound  Tracing (instead of photographs) 0 X - Simple Wound Measurement - one wound 1 5 []  - Complex Wound Measurement - multiple wounds 0 INTERVENTIONS - Wound Dressings X - Small Wound Dressing one or multiple wounds 1 10 []  - Medium Wound Dressing one or multiple wounds 0 []  - Large Wound Dressing one or multiple wounds 0 []  - Application of Medications - topical 0 []  - Application of Medications - injection 0 INTERVENTIONS - Miscellaneous []  - External ear exam 0 Bushong, Shaneika C. ([id number]) []  - Specimen Collection (cultures, biopsies, blood, body fluids, etc.) 0 []  - Specimen(s) / Culture(s) sent or taken to Lab for analysis 0 []  - Patient Transfer (multiple staff / Michiel Sites Lift / Similar devices) 0 []  - Simple Staple / Suture removal (25 or less) 0 []  - Complex Staple / Suture removal (26 or more) 0 []  - Hypo / Hyperglycemic Management (close monitor of Blood Glucose) 0 []  - Ankle / Brachial Index (ABI) - do not check if billed separately 0 X - Vital Signs 1 5 Has the patient been seen at the hospital within the last three years: Yes Total Score: 80 Level Of Care: New/Established - Level 3 Electronic Signature(s) Signed: 12/06/2016 3:54:46 PM By: Curtis Sites Entered By: Curtis Sites on 12/06/2016 15:35:53 [name] ([id number]) -------------------------------------------------------------------------------- Encounter Discharge Information Details [name] Date of Service: 12/06/2016 3:30 PM Medical Record Number: [id number] Patient Account Number: [id number] Date of Birth/Sex: [birth year] (71 y.o. Female) Treating RN: Curtis Sites Primary Care Travin Marik: Milon Score Other Clinician: Referring Kaci Dillie: Treating Tonga Prout/Extender: Rudene Re in Treatment: 12 Encounter Discharge Information  Items Discharge Pain Level: 0 Discharge Condition: Stable Ambulatory Status: Ambulatory Discharge Destination: Home Transportation: Private Auto Accompanied By: self Schedule Follow-up Appointment: Yes Medication Reconciliation completed and provided to Patient/Care No Talar Fraley: Provided on Clinical Summary of Care: 12/06/2016 Form Type Recipient Paper Patient ML Electronic Signature(s) Signed: 12/06/2016 3:44:59 PM By: Gwenlyn Perking Entered By: Gwenlyn Perking on 12/06/2016 15:44:59 [name] ([id number]) -------------------------------------------------------------------------------- Lower Extremity Assessment Details [name] Date of Service: 12/06/2016 3:30 PM Medical Record Number: [id number] Patient Account Number: [id number] Date of Birth/Sex: 1945-11-25 (71 y.o. Female) Treating RN: Curtis Sites Primary Care Yoshito Gaza: Milon Score Other Clinician: Referring Asjah Rauda: Treating Bryston Colocho/Extender: Rudene Re in Treatment: 12 Vascular Assessment Pulses: Dorsalis Pedis Palpable: [Right:Yes] Posterior Tibial Extremity colors, hair growth, and conditions: Extremity Color: [Right:Hyperpigmented] Hair Growth on Extremity: [Right:Yes] Temperature of Extremity: [Right:Warm] Capillary Refill: [Right:< 3 seconds] Electronic Signature(s) Signed: 12/06/2016 3:54:46 PM By: Curtis Sites Entered By: Curtis Sites on 12/06/2016 15:29:26 [name] ([id number]) -------------------------------------------------------------------------------- Multi Wound Chart Details [name] Date of Service: 12/06/2016 3:30 PM Medical Record Number: [id number] Patient Account Number: [id number] Date of Birth/Sex: [birth year] (71 y.o. Female) Treating RN: Curtis Sites Primary Care Chance Munter: Milon Score Other Clinician: Referring Carliss Quast: Treating Adoria Kawamoto/Extender: Rudene Re in Treatment: 12 Vital Signs Height(in):  67 Pulse(bpm): 75 Weight(lbs): 197 Blood Pressure 145/72 (mmHg): Body Mass Index(BMI): 31 Temperature(F): Respiratory Rate 18 (breaths/min): Photos: [N/A:N/A] Wound Location: Right Lower Leg - N/A N/A Anterior, Proximal Wounding Event: Trauma N/A N/A Primary Etiology: Trauma, Other N/A N/A Comorbid History: Chronic Obstructive N/A N/A Pulmonary Disease (COPD), Hypertension, Type II Diabetes Date Acquired: 08/17/2016 N/A N/A Weeks of Treatment: 12 N/A N/A Wound Status: Open N/A N/A Measurements L x W x D 1.3x0.5x0.1 N/A N/A (cm) Area (cm) : 0.511 N/A N/A Volume (cm) : 0.051 N/A N/A % Reduction  in Area: 86.00% N/A N/A % Reduction in Volume: 86.00% N/A N/A Starting Position 1 6 (o'clock): Ending Position 1 12 (o'clock): Maximum Distance 1 0.5 (cm): Hendrickson, Freja C. ([id number]) Undermining: Yes N/A N/A Classification: Full Thickness Without N/A N/A Exposed Support Structures HBO Classification: Grade 1 N/A N/A Exudate Amount: Medium N/A N/A Exudate Type: Serous N/A N/A Exudate Color: amber N/A N/A Wound Margin: Epibole N/A N/A Granulation Amount: Large (67-100%) N/A N/A Granulation Quality: Red, Hyper-granulation N/A N/A Necrotic Amount: None Present (0%) N/A N/A Exposed Structures: Fat Layer (Subcutaneous N/A N/A Tissue) Exposed: Yes Fascia: No Tendon: No Muscle: No Joint: No Bone: No Epithelialization: Small (1-33%) N/A N/A Periwound Skin Texture: Scarring: Yes N/A N/A Excoriation: No Induration: No Callus: No Crepitus: No Rash: No Periwound Skin Maceration: No N/A N/A Moisture: Dry/Scaly: No Periwound Skin Color: Ecchymosis: Yes N/A N/A Atrophie Blanche: No Cyanosis: No Erythema: No Hemosiderin Staining: No Mottled: No Pallor: No Rubor: No Temperature: No Abnormality N/A N/A Tenderness on Yes N/A N/A Palpation: Wound Preparation: Ulcer Cleansing: N/A N/A Rinsed/Irrigated with Saline Topical Anesthetic Applied: Other:  lidocaine 4% Treatment Notes Wound #1 (Right, Proximal, Anterior Lower Leg) Pen, Kirstina C. ([id number]) 1. Cleansed with: Clean wound with Normal Saline 2. Anesthetic Topical Lidocaine 4% cream to wound bed prior to debridement 4. Dressing Applied: Other dressing (specify in notes) 5. Secondary Dressing Applied Dry Gauze 7. Secured with Tape Notes Sorbact, Gauze to bolster Electronic Signature(s) Signed: 12/06/2016 3:51:32 PM By: Evlyn KannerBritto, Errol MD, FACS Entered By: Evlyn KannerBritto, Errol on 12/06/2016 15:51:31 Janet SimpersLANGLEY, [name] ([id number]) -------------------------------------------------------------------------------- Multi-Disciplinary Care Plan Details Patient Name: Janet SimpersLANGLEY, [name] Date of Service: 12/06/2016 3:30 PM Medical Record Number: [id number] Patient Account Number: [id number] Date of Birth/Sex: [birth year] (71 y.o. Female) Treating RN: Curtis Sitesorthy, Joanna Primary Care Sincere Liuzzi: Milon ScoreJONES, CARON Other Clinician: Referring Joelyn Lover: Treating Charee Tumblin/Extender: Rudene ReBritto, Errol Weeks in Treatment: 12 Active Inactive ` Necrotic Tissue Nursing Diagnoses: Impaired tissue integrity related to necrotic/devitalized tissue Goals: Necrotic/devitalized tissue will be minimized in the wound bed Date Initiated: 09/13/2016 Target Resolution Date: 10/18/2016 Goal Status: Active Interventions: Assess patient pain level pre-, during and post procedure and prior to discharge Notes: ` Orientation to the Wound Care Program Nursing Diagnoses: Knowledge deficit related to the wound healing center program Goals: Patient/caregiver will verbalize understanding of the Wound Healing Center Program Date Initiated: 09/13/2016 Target Resolution Date: 12/20/2016 Goal Status: Active Interventions: Provide education on orientation to the wound center Notes: ` Wound/Skin Impairment Nursing Diagnoses: Impaired tissue integrity Janet SimpersLANGLEY, [name] ([id number]) Goals: Patient/caregiver will verbalize understanding  of skin care regimen Date Initiated: 09/13/2016 Target Resolution Date: 12/20/2016 Goal Status: Active Ulcer/skin breakdown will have a volume reduction of 30% by week 4 Date Initiated: 09/13/2016 Target Resolution Date: 12/20/2016 Goal Status: Active Ulcer/skin breakdown will have a volume reduction of 50% by week 8 Date Initiated: 09/13/2016 Target Resolution Date: 12/20/2016 Goal Status: Active Ulcer/skin breakdown will have a volume reduction of 80% by week 12 Date Initiated: 09/13/2016 Target Resolution Date: 12/20/2016 Goal Status: Active Ulcer/skin breakdown will heal within 14 weeks Date Initiated: 09/13/2016 Target Resolution Date: 12/20/2016 Goal Status: Active Interventions: Assess patient/caregiver ability to obtain necessary supplies Assess patient/caregiver ability to perform ulcer/skin care regimen upon admission and as needed Assess ulceration(s) every visit Notes: Electronic Signature(s) Signed: 12/06/2016 3:54:46 PM By: Curtis Sitesorthy, Joanna Entered By: Curtis Sitesorthy, Joanna on 12/06/2016 15:29:34 Janet SimpersLANGLEY, [name] ([id number]) -------------------------------------------------------------------------------- Pain Assessment Details Patient Name: Janet SimpersLANGLEY, [name] Date of Service: 12/06/2016 3:30 PM Medical Record Number: [id number] Patient Account  Number: [id number] Date of Birth/Sex: July 26, 1945 (71 y.o. Female) Treating RN: Curtis Sites Primary Care [place] Pechacek: Milon Score Other Clinician: Referring Fifi Schindler: Treating Valla Pacey/Extender: Rudene Re in Treatment: 12 Active Problems Location of Pain Severity and Description of Pain Patient Has Paino No Site Locations Pain Management and Medication Current Pain Management: Electronic Signature(s) Signed: 12/06/2016 3:54:46 PM By: Curtis Sites Entered By: Curtis Sites on 12/06/2016 15:17:45 [name] ([id number]) -------------------------------------------------------------------------------- Patient/Caregiver Education  Details [name] Date of Service: 12/06/2016 3:30 PM Medical Record Number: [id number] Patient Account Number: [id number] Date of Birth/Gender: 12-17-1945 (71 y.o. Female) Treating RN: Curtis Sites Primary Care Physician: Milon Score Other Clinician: Referring Physician: Treating Physician/Extender: Rudene Re in Treatment: 12 Education Assessment Education Provided To: Patient Education Topics Provided Wound/Skin Impairment: Handouts: Other: wound care as ordered Methods: Demonstration, Explain/Verbal Responses: State content correctly Electronic Signature(s) Signed: 12/06/2016 3:54:46 PM By: Curtis Sites Entered By: Curtis Sites on 12/06/2016 15:31:09 [name] ([id number]) -------------------------------------------------------------------------------- Wound Assessment Details [name] Date of Service: 12/06/2016 3:30 PM Medical Record Number: [id number] Patient Account Number: [id number] Date of Birth/Sex: [birth year] (71 y.o. Female) Treating RN: Curtis Sites Primary Care Jayln Madeira: Milon Score Other Clinician: Referring Garet Hooton: Treating Nhyla Nappi/Extender: Rudene Re in Treatment: 12 Wound Status Wound Number: 1 Primary Trauma, Other Etiology: Wound Location: Right Lower Leg - Anterior, Proximal Wound Open Status: Wounding Event: Trauma Comorbid Chronic Obstructive Pulmonary Date Acquired: 08/17/2016 History: Disease (COPD), Hypertension, Type Weeks Of Treatment: 12 II Diabetes Clustered Wound: No Photos Photo Uploaded By: Curtis Sites on 12/06/2016 15:49:59 Wound Measurements Length: (cm) 1.3 Width: (cm) 0.5 Depth: (cm) 0.1 Area: (cm) 0.511 Volume: (cm) 0.051 % Reduction in Area: 86% % Reduction in Volume: 86% Epithelialization: Small (1-33%) Tunneling: No Undermining: Yes Starting Position (o'clock): 6 Ending Position (o'clock): 12 Maximum Distance: (cm) 0.5 Wound  Description Full Thickness Without Classification: Exposed Support Structures Diabetic Severity Grade 1 (Wagner): Wound Margin: Epibole Exudate Amount: Medium [name], [name] ([id number]) Foul Odor After Cleansing: No Slough/Fibrino No Exudate Type: Serous Exudate Color: amber Wound Bed Granulation Amount: Large (67-100%) Exposed Structure Granulation Quality: Red, Hyper-granulation Fascia Exposed: No Necrotic Amount: None Present (0%) Fat Layer (Subcutaneous Tissue) Exposed: Yes Tendon Exposed: No Muscle Exposed: No Joint Exposed: No Bone Exposed: No Periwound Skin Texture Texture Color No Abnormalities Noted: No No Abnormalities Noted: No Callus: No Atrophie Blanche: No Crepitus: No Cyanosis: No Excoriation: No Ecchymosis: Yes Induration: No Erythema: No Rash: No Hemosiderin Staining: No Scarring: Yes Mottled: No Pallor: No Moisture Rubor: No No Abnormalities Noted: No Dry / Scaly: No Temperature / Pain Maceration: No Temperature: No Abnormality Tenderness on Palpation: Yes Wound Preparation Ulcer Cleansing: Rinsed/Irrigated with Saline Topical Anesthetic Applied: Other: lidocaine 4%, Treatment Notes Wound #1 (Right, Proximal, Anterior Lower Leg) 1. Cleansed with: Clean wound with Normal Saline 2. Anesthetic Topical Lidocaine 4% cream to wound bed prior to debridement 4. Dressing Applied: Other dressing (specify in notes) 5. Secondary Dressing Applied Dry Gauze 7. Secured with Tape Notes Sorbact, Gauze to bolster [name], [name] ([id number]) Electronic Signature(s) Signed: 12/06/2016 3:54:46 PM By: Curtis Sites Entered By: Curtis Sites on 12/06/2016 15:28:50 [name], [name] ([id number]) -------------------------------------------------------------------------------- Vitals Details [name] Date of Service: 12/06/2016 3:30 PM Medical Record Number: [id number] Patient Account Number: [id number] Date of Birth/Sex:  [birth year] (71 y.o. Female) Treating RN: Curtis Sites Primary Care Larkin Morelos: Milon Score Other Clinician: Referring Erhardt Dada: Treating Micharl Helmes/Extender: Rudene Re in Treatment: 12 Vital  Signs Time Taken: 15:17 Pulse (bpm): 75 Height (in): 67 Respiratory Rate (breaths/min): 18 Weight (lbs): 197 Blood Pressure (mmHg): 145/72 Body Mass Index (BMI): 30.9 Reference Range: 80 - 120 mg / dl Electronic Signature(s) Signed: 12/06/2016 3:54:46 PM By: Curtis Sites Entered By: Curtis Sites on 12/06/2016 15:18:54

## 2016-12-08 NOTE — Progress Notes (Signed)
[name], [name] ([id number]) Visit Report for 12/06/2016 Chief Complaint Document Details [name], [name] Date of Service: 12/06/2016 3:30 PM Medical Record Number: [id number] Patient Account Number: [id number] Date of Birth/Sex: [birth year] (70 y.o. Female) Treating RN: Curtis Sitesorthy, Joanna Primary Care Provider: Milon ScoreJONES, CARON Other Clinician: Referring Provider: Treating Provider/Extender: Rudene ReBritto, Marjan Rosman Weeks in Treatment: 12 Information Obtained from: Patient Chief Complaint patient is here for follow-up evaluation of a right lower extremity ulcer Electronic Signature(s) Signed: 12/06/2016 3:51:43 PM By: Evlyn KannerBritto, Lakesa Coste MD, FACS Entered By: Evlyn KannerBritto, Britanee Vanblarcom on 12/06/2016 15:51:43 [name], [name] ([id number]) -------------------------------------------------------------------------------- HPI Details [name], [name] Date of Service: 12/06/2016 3:30 PM Medical Record Number: [id number] Patient Account Number: [id number] Date of Birth/Sex: [birth year](70 y.o. Female) Treating RN: Curtis Sitesorthy, Joanna Primary Care Provider: Milon ScoreJONES, CARON Other Clinician: Referring Provider: Treating Provider/Extender: Rudene ReBritto, Detravion Tester Weeks in Treatment: 12 History of Present Illness Location: right lower extremity in the area of the lateral calf below the knee Quality: Patient reports experiencing a sharp pain to affected area(s) but this is not occurring at this point Severity: Patient states wound are getting better but too slowly for her liking Duration: Patient has had the wound for < 4 weeks prior to presenting for treatment Timing: Pain in wound is intermittent and less frequent these days Context: The wound occurred when the patient a blunt injury and fall Modifying Factors: Other treatment(s) tried include:courses of doxycycline and local Bactroban ointment Associated Signs and Symptoms: Patient reports having increase discharge. HPI Description: 71 year old patient has been referred  to as with right lower extremity wound with edema and ecchymosis, after having a injury due to a fall on 08/20/2016. she has a history of diabetes mellitus type 2 which is uncomplicated, chronic back pain, COPD, hypertension, hypothyroidism, supraventricular tachycardia and tobacco abuse. He is status post neck surgery, appendectomy, cholecystectomy, hernia repair. He smokes about half a pack of cigarettes a day for the last 50 years. Recently on doxycycline for 10 days and was given symptomatic treatment for wound. 10/04/2016 -- the patient tolerated the snap vacuum system well and had no significant problems. She continues to smoke and is trying to give it up. 10/11/2016 -- she continues to tolerate the snap like him system fairly well and other than that she has no fresh issues 11/01/16- she is here for follow-up evaluation of her right lower extremity ulcer. She is tolerating the SNAP negative pressure system. She is voicing no complaints of pain. She states her glucose levels are consistently less than 150 with her evening glucose levels being higher than morning glucose levels. She continues to smoke, approximately 0.5-ppd which is a reduction of 1-ppd approximately 6 months ago. 11/08/16 patient on evaluation today fortunately does not appear to be having as much discomfort as she has in the past. She is tolerating the dressing changes well although she does note some irritation where the wound VAC has been applied. She specifically wonders if we could see about taking a break possibly progressing to discontinuation of the wound VAC. There is no evidence of infection. Electronic Signature(s) Signed: 12/06/2016 3:51:50 PM By: Evlyn KannerBritto, Ariadna Setter MD, FACS Entered By: Evlyn KannerBritto, Dennys Guin on 12/06/2016 15:51:49 [name], [name] ([id number]) -------------------------------------------------------------------------------- Physical Exam Details [name], Malky C. Date of Service: 12/06/2016 3:30  PM Medical Record Number: [id number] Patient Account Number: [id number] Date of Birth/Sex: [birth year] (70 y.o. Female) Treating RN: Curtis Sitesorthy, Joanna Primary Care Provider: Milon ScoreJONES, CARON Other Clinician: Referring Provider: Treating Provider/Extender: Rudene ReBritto, Ayla Dunigan Weeks in  Treatment: 12 Constitutional . Pulse regular. Respirations normal and unlabored. Afebrile. . Eyes Nonicteric. Reactive to light. Ears, Nose, Mouth, and Throat Lips, teeth, and gums WNL.Marland Kitchen Moist mucosa without lesions. Neck supple and nontender. No palpable supraclavicular or cervical adenopathy. Normal sized without goiter. Respiratory WNL. No retractions.. Breath sounds WNL, No rubs, rales, rhonchi, or wheeze.. Cardiovascular Heart rhythm and rate regular, no murmur or gallop.. Pedal Pulses WNL. No clubbing, cyanosis or edema. Chest Breasts symmetical and no nipple discharge.. Breast tissue WNL, no masses, lumps, or tenderness.. Lymphatic No adneopathy. No adenopathy. No adenopathy. Musculoskeletal Adexa without tenderness or enlargement.. Digits and nails w/o clubbing, cyanosis, infection, petechiae, ischemia, or inflammatory conditions.. Integumentary (Hair, Skin) No suspicious lesions. No crepitus or fluctuance. No peri-wound warmth or erythema. No masses.Marland Kitchen Psychiatric Judgement and insight Intact.. No evidence of depression, anxiety, or agitation.. Notes the medial part of the wound has completely healed and it is a small crescent on the lateral part which still has undermining though less. Electronic Signature(s) Signed: 12/06/2016 3:52:22 PM By: Evlyn Kanner MD, FACS Entered By: Evlyn Kanner on 12/06/2016 15:52:21 [name], [name] ([id number]) -------------------------------------------------------------------------------- Physician Orders Details Patient Name: Janet Simpers Date of Service: 12/06/2016 3:30 PM Medical Record Number: [id number] Patient Account Number: [id number] Date of Birth/Sex:  Sep 13, 1945 (70 y.o. Female) Treating RN: Curtis Sites Primary Care Provider: Milon Score Other Clinician: Referring Provider: Treating Provider/Extender: Rudene Re in Treatment: 12 Verbal / Phone Orders: No Diagnosis Coding Wound Cleansing Wound #1 Right,Proximal,Anterior Lower Leg o Clean wound with Normal Saline. o May Shower, gently pat wound dry prior to applying new dressing. Anesthetic Wound #1 Right,Proximal,Anterior Lower Leg o Topical Lidocaine 4% cream applied to wound bed prior to debridement Skin Barriers/Peri-Wound Care Wound #1 Right,Proximal,Anterior Lower Leg o Skin Prep Primary Wound Dressing Wound #1 Right,Proximal,Anterior Lower Leg o Cutimed Sorbact Secondary Dressing o Dry Gauze - bolster with tape Dressing Change Frequency Wound #1 Right,Proximal,Anterior Lower Leg o Change dressing every other day. Follow-up Appointments Wound #1 Right,Proximal,Anterior Lower Leg o Return Appointment in 1 week. Edema Control Wound #1 Right,Proximal,Anterior Lower Leg o Elevate legs to the level of the heart and pump ankles as often as possible Additional Orders / Instructions Wound #1 Right,Proximal,Anterior Lower Leg o Stop Smoking Bales, Hettie C. ([id number]) o Increase protein intake. o Other: - Please try to keep blood sugars below 180 Electronic Signature(s) Signed: 12/06/2016 4:01:04 PM By: Evlyn Kanner MD, FACS Entered By: Evlyn Kanner on 12/06/2016 15:52:33 [name], [name] ([id number]) -------------------------------------------------------------------------------- Problem List Details Patient Name: Janet Simpers Date of Service: 12/06/2016 3:30 PM Medical Record Number: [id number] Patient Account Number: [id number] Date of Birth/Sex: 05/18/1946 (70 y.o. 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 Female) Treating RN: Dorthy,  Mardene Celeste Primary Care Provider: Milon Score Other Clinician: Referring Provider: Treating Provider/Extender: Rudene Re in Treatment: 12 Subjective Chief Complaint Information obtained from Patient patient is here for follow-up evaluation of a right lower extremity ulcer History of Present Illness (HPI) The following HPI elements were documented for the patient's wound: Location: right lower extremity in the area of the lateral calf below the knee Quality: Patient reports experiencing a sharp pain to affected area(s) but this is not occurring at this point Severity: Patient states wound are getting better but too slowly for her liking Duration: Patient has had the wound for < 4 weeks prior to presenting for treatment Timing: Pain in wound is intermittent and less frequent these days Context: The wound occurred when the patient a blunt injury and fall Modifying Factors: Other treatment(s) tried include:courses of doxycycline and local Bactroban  ointment Associated Signs and Symptoms: Patient reports having increase discharge. 71 year old patient has been referred to as with right lower extremity wound with edema and ecchymosis, after having a injury due to a fall on 08/20/2016. she has a history of diabetes mellitus type 2 which is uncomplicated, chronic back pain, COPD, hypertension, hypothyroidism, supraventricular tachycardia and tobacco abuse. He is status post neck surgery, appendectomy, cholecystectomy, hernia repair. He smokes about half a pack of cigarettes a day for the last 50 years. Recently on doxycycline for 10 days and was given symptomatic treatment for wound. 10/04/2016 -- the patient tolerated the snap vacuum system well and had no significant problems. She continues to smoke and is trying to give it up. 10/11/2016 -- she continues to tolerate the snap like him system fairly well and other than that she has no fresh issues 11/01/16- she is here for follow-up evaluation of her right lower extremity ulcer. She is tolerating the SNAP negative pressure system. She is voicing no complaints of pain. She states her glucose levels are consistently less than 150 with her evening glucose levels being higher than morning glucose levels. She continues to smoke, approximately 0.5-ppd which is a reduction of 1-ppd approximately 6 months ago. 11/08/16 patient on evaluation today fortunately does not appear to be having as much discomfort as she has in the past. She is tolerating the dressing changes well although she does note some irritation where the wound VAC has been applied. She specifically wonders if we could see about taking a break possibly progressing to discontinuation of the wound VAC. There is no evidence of infection. [name], [name] ([id number]) Objective Constitutional Pulse regular. Respirations normal and unlabored. Afebrile. Vitals Time Taken: 3:17 PM, Height: 67 in, Weight: 197 lbs, BMI: 30.9, Pulse: 75 bpm,  Respiratory Rate: 18 breaths/min, Blood Pressure: 145/72 mmHg. Eyes Nonicteric. Reactive to light. Ears, Nose, Mouth, and Throat Lips, teeth, and gums WNL.Marland Kitchen Moist mucosa without lesions. Neck supple and nontender. No palpable supraclavicular or cervical adenopathy. Normal sized without goiter. Respiratory WNL. No retractions.. Breath sounds WNL, No rubs, rales, rhonchi, or wheeze.. Cardiovascular Heart rhythm and rate regular, no murmur or gallop.. Pedal Pulses WNL. No clubbing, cyanosis or edema. Chest Breasts symmetical and no nipple discharge.. Breast tissue WNL, no masses, lumps, or tenderness.. Lymphatic No adneopathy. No adenopathy. No adenopathy. Musculoskeletal Adexa without tenderness or enlargement.. Digits and nails w/o clubbing, cyanosis, infection, petechiae, ischemia, or inflammatory conditions.Marland Kitchen Psychiatric Judgement and insight Intact.. No evidence of depression, anxiety, or agitation.. General Notes: the medial part of the wound has completely healed and it is a small crescent on the lateral part  which still has undermining though less. Integumentary (Hair, Skin) No suspicious lesions. No crepitus or fluctuance. No peri-wound warmth or erythema. No masses.. Wound #1 status is Open. Original cause of wound was Trauma. The wound is located on the Right,Proximal,Anterior Lower Leg. The wound measures 1.3cm length x 0.5cm width x 0.1cm depth; 0.511cm^2 area and 0.051cm^3 volume. There is Fat Layer (Subcutaneous Tissue) Exposed exposed. [name], [name] ([id number]) There is no tunneling noted, however, there is undermining starting at 6:00 and ending at 12:00 with a maximum distance of 0.5cm. There is a medium amount of serous drainage noted. The wound margin is epibole. There is large (67-100%) red granulation within the wound bed. There is no necrotic tissue within the wound bed. The periwound skin appearance exhibited: Scarring, Ecchymosis. The periwound  skin appearance did not exhibit: Callus, Crepitus, Excoriation, Induration, Rash, Dry/Scaly, Maceration, Atrophie Blanche, Cyanosis, Hemosiderin Staining, Mottled, Pallor, Rubor, Erythema. Periwound temperature was noted as No Abnormality. The periwound has tenderness on palpation. 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 May Shower, gently pat wound dry prior to applying new dressing. Anesthetic: Wound #1 Right,Proximal,Anterior Lower Leg: Topical Lidocaine 4% cream applied to wound bed prior to debridement Skin Barriers/Peri-Wound Care: Wound #1 Right,Proximal,Anterior Lower Leg: Skin Prep Primary Wound Dressing: Wound #1 Right,Proximal,Anterior Lower Leg: Cutimed Sorbact Secondary Dressing: Dry Gauze - bolster with tape Dressing Change Frequency: Wound #1 Right,Proximal,Anterior Lower Leg: Change dressing every other day. Follow-up Appointments: Wound #1 Right,Proximal,Anterior Lower Leg: Return Appointment in 1 week. Edema Control: [name], [name]. ([id number]) Wound #1 Right,Proximal,Anterior Lower Leg: Elevate legs to the level of the heart and pump ankles as often as possible Additional Orders / Instructions: Wound #1 Right,Proximal,Anterior Lower Leg: Stop Smoking Increase protein intake. Other: - Please try to keep blood sugars below 180 I have recommended placing a small piece of Sorbact on the wound and nothing under the skin flap and using a pressure dressing externally to try and close the dead space. There has been good progress this week and we will wait a while before  deciding whether we will go ahead with application of a skin substitute She is encouraged to give up smoking and continue adequate protein and vitamin supplements Electronic Signature(s) Signed: 12/06/2016 3:54:34 PM By: Evlyn Kanner MD, FACS Entered By: Evlyn Kanner on 12/06/2016 15:54:34 Janet Simpers ([id number]) -------------------------------------------------------------------------------- SuperBill Details Patient Name: Janet Simpers Date of Service: 12/06/2016 Medical Record Number: [id number] Patient Account Number: [id number] Date of Birth/Sex: 01/28/1946 (70 y.o. Female) Treating RN: Curtis Sites Primary Care Provider: Milon Score Other Clinician: Referring Provider: Treating Provider/Extender: Rudene Re in Treatment: 12 Diagnosis Coding ICD-10 Codes Code Description E11.622 Type 2 diabetes mellitus with other skin ulcer M70.861 Other soft tissue disorders related to use, overuse and pressure, right lower leg L97.212 Non-pressure chronic ulcer of right calf with fat layer exposed F17.218 Nicotine dependence, cigarettes, with other nicotine-induced disorders Facility Procedures CPT4 Code: [id number] Description: 99213 - WOUND CARE VISIT-LEV 3 EST PT Modifier: Quantity: 1 Physician Procedures CPT4: Description Modifier Quantity Code [id number] 99213 - WC PHYS LEVEL 3 - EST PT 1 ICD-10 Description Diagnosis E11.622 Type 2 diabetes mellitus with other skin  ulcer M70.861 Other soft tissue disorders related to use, overuse and pressure, right lower  leg L97.212 Non-pressure chronic ulcer of right calf with fat layer exposed Electronic Signature(s) Signed: 12/06/2016 3:54:56 PM By: Evlyn Kanner MD, FACS Entered By: Evlyn Kanner on 12/06/2016 15:54:56

## 2016-12-13 ENCOUNTER — Encounter: Payer: Medicare Other | Attending: Surgery | Admitting: Surgery

## 2016-12-13 DIAGNOSIS — Z79899 Other long term (current) drug therapy: Secondary | ICD-10-CM | POA: Diagnosis not present

## 2016-12-13 DIAGNOSIS — E039 Hypothyroidism, unspecified: Secondary | ICD-10-CM | POA: Diagnosis not present

## 2016-12-13 DIAGNOSIS — M545 Low back pain: Secondary | ICD-10-CM | POA: Insufficient documentation

## 2016-12-13 DIAGNOSIS — E11622 Type 2 diabetes mellitus with other skin ulcer: Secondary | ICD-10-CM | POA: Insufficient documentation

## 2016-12-13 DIAGNOSIS — Z7984 Long term (current) use of oral hypoglycemic drugs: Secondary | ICD-10-CM | POA: Insufficient documentation

## 2016-12-13 DIAGNOSIS — Z886 Allergy status to analgesic agent status: Secondary | ICD-10-CM | POA: Diagnosis not present

## 2016-12-13 DIAGNOSIS — M70861 Other soft tissue disorders related to use, overuse and pressure, right lower leg: Secondary | ICD-10-CM | POA: Diagnosis not present

## 2016-12-13 DIAGNOSIS — I10 Essential (primary) hypertension: Secondary | ICD-10-CM | POA: Insufficient documentation

## 2016-12-13 DIAGNOSIS — Z7982 Long term (current) use of aspirin: Secondary | ICD-10-CM | POA: Diagnosis not present

## 2016-12-13 DIAGNOSIS — G8929 Other chronic pain: Secondary | ICD-10-CM | POA: Diagnosis not present

## 2016-12-13 DIAGNOSIS — I471 Supraventricular tachycardia: Secondary | ICD-10-CM | POA: Diagnosis not present

## 2016-12-13 DIAGNOSIS — F17218 Nicotine dependence, cigarettes, with other nicotine-induced disorders: Secondary | ICD-10-CM | POA: Insufficient documentation

## 2016-12-13 DIAGNOSIS — L97212 Non-pressure chronic ulcer of right calf with fat layer exposed: Secondary | ICD-10-CM | POA: Insufficient documentation

## 2016-12-13 DIAGNOSIS — J449 Chronic obstructive pulmonary disease, unspecified: Secondary | ICD-10-CM | POA: Insufficient documentation

## 2016-12-13 DIAGNOSIS — Z881 Allergy status to other antibiotic agents status: Secondary | ICD-10-CM | POA: Diagnosis not present

## 2016-12-15 NOTE — Progress Notes (Signed)
[name], [name] ([id number]) Visit Report for 12/13/2016 Chief Complaint Document Details [name], [name] Date of Service: 12/13/2016 1:30 PM Medical Record Number: [id number] Patient Account Number: [id number] Date of Birth/Sex: 04-22-1946 (70 y.o. Female) Treating RN: Curtis Sites Primary Care Provider: Milon Score Other Clinician: Referring Provider: Treating Provider/Extender: Rudene Re in Treatment: 13 Information Obtained from: Patient Chief Complaint patient is here for follow-up evaluation of a right lower extremity ulcer Electronic Signature(s) Signed: 12/13/2016 2:05:59 PM By: Evlyn Kanner MD, FACS Entered By: Evlyn Kanner on 12/13/2016 14:05:59 [name] ([id number]) -------------------------------------------------------------------------------- Debridement Details [name] Date of Service: 12/13/2016 1:30 PM Medical Record Number: [id number] Patient Account Number: [id number] Date of Birth/Sex: 03-11-1946 (70 y.o. Female) Treating RN: Curtis Sites Primary Care Provider: Milon Score Other Clinician: Referring Provider: Treating Provider/Extender: Rudene Re in Treatment: 13 Debridement Performed for Wound #1 Right,Proximal,Anterior Lower Leg Assessment: Performed By: Physician Evlyn Kanner, MD Debridement: Debridement Pre-procedure Verification/Time Out Yes - 13:56 Taken: Start Time: 13:56 Pain Control: Lidocaine 4% Topical Solution Level: Skin/Subcutaneous Tissue Total Area Debrided (L x 1.2 (cm) x 0.3 (cm) = 0.36 (cm) W): Tissue and other Viable, Non-Viable, Fibrin/Slough, Subcutaneous material debrided: Instrument: Curette Bleeding: Minimum Hemostasis Achieved: Pressure End Time: 13:59 Procedural Pain: 0 Post Procedural Pain: 0 Response to Treatment: Procedure was tolerated well Post Debridement Measurements of Total Wound Length: (cm) 1.2 Width: (cm) 0.3 Depth: (cm) 0.1 Volume:  (cm) 0.028 Character of Wound/Ulcer Post Improved Debridement: Post Procedure Diagnosis Same as Pre-procedure Electronic Signature(s) Signed: 12/13/2016 2:05:52 PM By: Evlyn Kanner MD, FACS Signed: 12/13/2016 4:43:23 PM By: Curtis Sites Entered By: Evlyn Kanner on 12/13/2016 14:05:52 [name] ([id number]) -------------------------------------------------------------------------------- HPI Details [name] Date of Service: 12/13/2016 1:30 PM Medical Record Number: [id number] Patient Account Number: [id number] Date of Birth/Sex: 1945/09/13 (70 y.o. Female) Treating RN: Curtis Sites Primary Care Provider: Milon Score Other Clinician: Referring Provider: Treating Provider/Extender: Rudene Re in Treatment: 13 History of Present Illness Location: right lower extremity in the area of the lateral calf below the knee Quality: Patient reports experiencing a sharp pain to affected area(s) but this is not occurring at this point Severity: Patient states wound are getting better but too slowly for her liking Duration: Patient has had the wound for < 4 weeks prior to presenting for treatment Timing: Pain in wound is intermittent and less frequent these days Context: The wound occurred when the patient a blunt injury and fall Modifying Factors: Other treatment(s) tried include:courses of doxycycline and local Bactroban ointment Associated Signs and Symptoms: Patient reports having increase discharge. HPI Description: 71 year old patient has been referred to as with right lower extremity wound with edema and ecchymosis, after having a injury due to a fall on 08/20/2016. she has a history of diabetes mellitus type 2 which is uncomplicated, chronic back pain, COPD, hypertension, hypothyroidism, supraventricular tachycardia and tobacco abuse. He is status post neck surgery, appendectomy, cholecystectomy, hernia repair. He smokes about half a pack of cigarettes  a day for the last 50 years. Recently on doxycycline for 10 days and was given symptomatic treatment for wound. 10/04/2016 -- the patient tolerated the snap vacuum system well and had no significant problems. She continues to smoke and is trying to give it up. 10/11/2016 -- she continues to tolerate the snap like him system fairly well and other than that she has no fresh issues 11/01/16- she is here for follow-up evaluation of her right  lower extremity ulcer. She is tolerating the SNAP negative pressure system. She is voicing no complaints of pain. She states her glucose levels are consistently less than 150 with her evening glucose levels being higher than morning glucose levels. She continues to smoke, approximately 0.5-ppd which is a reduction of 1-ppd approximately 6 months ago. 11/08/16 patient on evaluation today fortunately does not appear to be having as much discomfort as she has in the past. She is tolerating the dressing changes well although she does note some irritation where the wound VAC has been applied. She specifically wonders if we could see about taking a break possibly progressing to discontinuation of the wound VAC. There is no evidence of infection. Electronic Signature(s) Signed: 12/13/2016 2:06:04 PM By: Evlyn Kanner MD, FACS Entered By: Evlyn Kanner on 12/13/2016 14:06:03 [name] ([id number]) -------------------------------------------------------------------------------- Physical Exam Details [name] Date of Service: 12/13/2016 1:30 PM Medical Record Number: [id number] Patient Account Number: [id number] Date of Birth/Sex: 20-Nov-1945 (70 y.o. Female) Treating RN: Curtis Sites Primary Care Provider: Milon Score Other Clinician: Referring Provider: Treating Provider/Extender: Rudene Re in Treatment: 13 Constitutional . Pulse regular. Respirations normal and unlabored. Afebrile. . Eyes Nonicteric. Reactive to light. Ears,  Nose, Mouth, and Throat Lips, teeth, and gums WNL.Marland Kitchen Moist mucosa without lesions. Neck supple and nontender. No palpable supraclavicular or cervical adenopathy. Normal sized without goiter. Respiratory WNL. No retractions.. Breath sounds WNL, No rubs, rales, rhonchi, or wheeze.. Cardiovascular Heart rhythm and rate regular, no murmur or gallop.. Pedal Pulses WNL. No clubbing, cyanosis or edema. Chest Breasts symmetical and no nipple discharge.. Breast tissue WNL, no masses, lumps, or tenderness.. Gastrointestinal (GI) Abdomen without masses or tenderness.. No liver or spleen enlargement or tenderness.. Genitourinary (GU) No hydrocele, spermatocele, tenderness of the cord, or testicular mass.Marland Kitchen Penis without lesions.Renetta Chalk without lesions. No cystocele, or rectocele. Pelvic support intact, no discharge.Marland Kitchen Urethra without masses, tenderness or scarring.Marland Kitchen Lymphatic No adneopathy. No adenopathy. No adenopathy. Musculoskeletal Adexa without tenderness or enlargement.. Digits and nails w/o clubbing, cyanosis, infection, petechiae, ischemia, or inflammatory conditions.. Integumentary (Hair, Skin) No suspicious lesions. No crepitus or fluctuance. No peri-wound warmth or erythema. No masses.Marland Kitchen Psychiatric Judgement and insight Intact.. No evidence of depression, anxiety, or agitation.. Notes the lateral part of the wound under the edge from the 6 to 12:00 position is still undermining and after sharp debridement both of the base of the ulcer and the skin overlying it we will put a pressure dressing. Electronic Signature(s) [name], [name] ([id number]) Signed: 12/13/2016 2:06:44 PM By: Evlyn Kanner MD, FACS Entered By: Evlyn Kanner on 12/13/2016 14:06:44 [name], [name] ([id number]) -------------------------------------------------------------------------------- Physician Orders Details [name] Date of Service: 12/13/2016 1:30 PM Medical Record Number:  [id number] Patient Account Number: [id number] Date of Birth/Sex: 1946-05-12 (70 y.o. Female) Treating RN: Curtis Sites Primary Care Provider: Milon Score Other Clinician: Referring Provider: Treating Provider/Extender: Rudene Re in Treatment: 68 Verbal / Phone Orders: No Diagnosis Coding Wound Cleansing Wound #1 Right,Proximal,Anterior Lower Leg o Clean wound with Normal Saline. o May Shower, gently pat wound dry prior to applying new dressing. Anesthetic Wound #1 Right,Proximal,Anterior Lower Leg o Topical Lidocaine 4% cream applied to wound bed prior to debridement Skin Barriers/Peri-Wound Care Wound #1 Right,Proximal,Anterior Lower Leg o Skin Prep Primary Wound Dressing Wound #1 Right,Proximal,Anterior Lower Leg o Cutimed Sorbact Secondary Dressing o Dry Gauze - bolster with tape Dressing Change Frequency Wound #1 Right,Proximal,Anterior Lower Leg o Change dressing every other day. Follow-up  Appointments Wound #1 Right,Proximal,Anterior Lower Leg o Return Appointment in 1 week. Edema Control Wound #1 Right,Proximal,Anterior Lower Leg o Elevate legs to the level of the heart and pump ankles as often as possible Additional Orders / Instructions Wound #1 Right,Proximal,Anterior Lower Leg o Stop Smoking Lannan, [name] ([id number]) o Increase protein intake. o Other: - Please try to keep blood sugars below 180 Notes Order Grafix skin substitute for next visit Electronic Signature(s) Signed: 12/13/2016 4:12:51 PM By: Evlyn Kanner MD, FACS Signed: 12/13/2016 4:43:23 PM By: Curtis Sites Entered By: Curtis Sites on 12/13/2016 14:01:21 [name], [name] ([id number]) -------------------------------------------------------------------------------- Problem List Details [name] Date of Service: 12/13/2016 1:30 PM Medical Record Number: [id number] Patient Account Number: [id number] Date of Birth/Sex: December 24, 1945 (70 y.o.  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 Female) Treating RN: Curtis Sites Primary Care Provider: Milon Score Other Clinician: Referring Provider: Treating Provider/Extender: Rudene Re in Treatment: 13 Subjective Chief Complaint Information obtained from Patient patient is here for follow-up evaluation of a right lower extremity ulcer History of Present Illness (HPI) The following HPI elements were documented for the patient's wound: Location: right lower extremity in the area of the lateral calf below the knee Quality: Patient reports experiencing a sharp pain to affected area(s) but this is not occurring at this point Severity: Patient states wound are getting better but too slowly for her liking Duration: Patient has had the wound for < 4 weeks prior to presenting for treatment Timing: Pain in wound is intermittent and less frequent these days Context:  The wound occurred when the patient a blunt injury and fall Modifying Factors: Other treatment(s) tried include:courses of doxycycline and local Bactroban ointment Associated Signs and Symptoms: Patient reports having increase discharge. 71 year old patient has been referred to as with right lower extremity wound with edema and ecchymosis, after having a injury due to a fall on 08/20/2016. she has a history of diabetes mellitus type 2 which is uncomplicated, chronic back pain, COPD, hypertension, hypothyroidism, supraventricular tachycardia and tobacco abuse. He is status post neck surgery, appendectomy, cholecystectomy, hernia repair. He smokes about half a pack of cigarettes a day for the last 50 years. Recently on doxycycline for 10 days and was given symptomatic treatment for wound. 10/04/2016 -- the patient tolerated the snap vacuum system well and had no significant problems. She continues to smoke and is trying to give it up. 10/11/2016 -- she continues to tolerate the snap like him system fairly well and other than that she has no fresh issues 11/01/16- she is here for follow-up evaluation of her right lower extremity ulcer. She is tolerating the SNAP negative pressure system. She is voicing no complaints of pain. She states her glucose  levels are consistently less than 150 with her evening glucose levels being higher than morning glucose levels. She continues to smoke, approximately 0.5-ppd which is a reduction of 1-ppd approximately 6 months ago. 11/08/16 patient on evaluation today fortunately does not appear to be having as much discomfort as she has in the past. She is tolerating the dressing changes well although she does note some irritation where the wound VAC has been applied. She specifically wonders if we could see about taking a break possibly progressing to discontinuation of the wound VAC. There is no evidence of infection. [name], [name]  ([id number]) Objective Constitutional Pulse regular. Respirations normal and unlabored. Afebrile. Vitals Time Taken: 1:45 PM, Height: 67 in, Weight: 197 lbs, BMI: 30.9, Temperature: 98.3 F, Pulse: 66 bpm, Respiratory Rate: 18 breaths/min, Blood Pressure: 145/54 mmHg. Eyes Nonicteric. Reactive to light. Ears, Nose, Mouth, and Throat Lips, teeth, and gums WNL.Marland Kitchen. Moist mucosa without lesions. Neck supple and nontender. No palpable supraclavicular or cervical adenopathy. Normal sized without goiter. Respiratory WNL. No retractions.. Breath sounds WNL, No rubs, rales, rhonchi, or wheeze.. Cardiovascular Heart rhythm and rate regular, no murmur or gallop.. Pedal Pulses WNL. No clubbing, cyanosis or edema. Chest Breasts symmetical and no nipple discharge.. Breast tissue WNL, no masses, lumps, or tenderness.. Gastrointestinal (GI) Abdomen without masses or tenderness.. No liver or spleen enlargement or tenderness.. Genitourinary (GU) No hydrocele, spermatocele, tenderness of the cord, or testicular mass.Marland Kitchen. Penis without lesions.Renetta Chalk. Genitalia without lesions. No cystocele, or rectocele. Pelvic support intact, no discharge.Marland Kitchen. Urethra without masses, tenderness or scarring.Marland Kitchen. Lymphatic No adneopathy. No adenopathy. No adenopathy. Musculoskeletal Adexa without tenderness or enlargement.. Digits and nails w/o clubbing, cyanosis, infection, petechiae, ischemia, or inflammatory conditions.Marland Kitchen. Psychiatric Judgement and insight Intact.. No evidence of depression, anxiety, or agitation.. General Notes: the lateral part of the wound under the edge from the 6 to 12:00 position is still undermining [place]ander, Leota C. ([id number]) and after sharp debridement both of the base of the ulcer and the skin overlying it we will put a pressure dressing. Integumentary (Hair, Skin) No suspicious lesions. No crepitus or fluctuance. No peri-wound warmth or erythema. No masses.. Wound #1 status is Open. Original cause of  wound was Trauma. The wound is located on the Right,Proximal,Anterior Lower Leg. The wound measures 1.2cm length x 0.3cm width x 0.1cm depth; 0.283cm^2 area and 0.028cm^3 volume. There is Fat Layer (Subcutaneous Tissue) Exposed exposed. There is no tunneling noted, however, there is undermining starting at 6:00 and ending at 12:00 with a maximum distance of 0.4cm. There is a medium amount of serous drainage noted. The wound margin is epibole. There is large (67-100%) red granulation within the wound bed. There is no necrotic tissue within the wound bed. The periwound skin appearance exhibited: Scarring, Ecchymosis. The periwound skin appearance did not exhibit: Callus, Crepitus, Excoriation, Induration, Rash, Dry/Scaly, Maceration, Atrophie Blanche, Cyanosis, Hemosiderin Staining, Mottled, Pallor, Rubor, Erythema. Periwound temperature was noted as No Abnormality. The periwound has tenderness on palpation. Assessment Active Problems ICD-10 E11.622 - Type 2 diabetes mellitus with other skin ulcer M70.861 - Other soft tissue disorders related to use, overuse and pressure, right lower leg L97.212 - Non-pressure chronic ulcer of right calf with fat layer exposed F17.218 - Nicotine dependence, cigarettes, with other nicotine-induced disorders Procedures Wound #1 Pre-procedure diagnosis of Wound #1 is a Trauma, Other located on the Right,Proximal,Anterior Lower Leg . There was a Skin/Subcutaneous Tissue Debridement ([id number]) debridement with total area of 0.36 sq cm performed by Evlyn KannerBritto, Nisaiah Bechtol, MD. with the  following instrument(s): Curette to remove Viable and Non-Viable tissue/material including Fibrin/Slough and Subcutaneous after achieving pain control using Lidocaine 4% Topical Solution. A time out was conducted at 13:56, prior to the start of the procedure. A Minimum amount of bleeding was controlled with Pressure. The procedure was tolerated well with a pain level of 0 throughout and a  pain level of 0 following the procedure. Post Debridement Measurements: 1.2cm length x 0.3cm width x 0.1cm depth; 0.028cm^3 volume. Character of Wound/Ulcer Post Debridement is improved. Post procedure Diagnosis Wound #1: Same as Pre-Procedure [name], Ragen C. ([id number]) Plan Wound Cleansing: Wound #1 Right,Proximal,Anterior Lower Leg: Clean wound with Normal Saline. May Shower, gently pat wound dry prior to applying new dressing. Anesthetic: Wound #1 Right,Proximal,Anterior Lower Leg: Topical Lidocaine 4% cream applied to wound bed prior to debridement Skin Barriers/Peri-Wound Care: Wound #1 Right,Proximal,Anterior Lower Leg: Skin Prep Primary Wound Dressing: Wound #1 Right,Proximal,Anterior Lower Leg: Cutimed Sorbact Secondary Dressing: Dry Gauze - bolster with tape Dressing Change Frequency: Wound #1 Right,Proximal,Anterior Lower Leg: Change dressing every other day. Follow-up Appointments: Wound #1 Right,Proximal,Anterior Lower Leg: Return Appointment in 1 week. Edema Control: Wound #1 Right,Proximal,Anterior Lower Leg: Elevate legs to the level of the heart and pump ankles as often as possible Additional Orders / Instructions: Wound #1 Right,Proximal,Anterior Lower Leg: Stop Smoking Increase protein intake. Other: - Please try to keep blood sugars below 180 General Notes: Order Grafix skin substitute for next visit The area between the 6 and 12:00 position has a undermining flap which will not close. I believe at this stage after using all conservative attempts, it is time to use a skin substitute to see if we can close this area of ulceration. We will order her Grafix for the next visit I have recommended placing a small piece of Sorbact on the wound and nothing under the skin flap and using a pressure dressing externally to try and close the dead space. JERMYA, DOWDING ([id number]) She is encouraged to give up smoking and continue adequate protein and vitamin  supplements Electronic Signature(s) Signed: 12/13/2016 2:09:07 PM By: Evlyn Kanner MD, FACS Entered By: Evlyn Kanner on 12/13/2016 14:09:07 [name] ([id number]) -------------------------------------------------------------------------------- SuperBill Details [name] Date of Service: 12/13/2016 Medical Record Number: [id number] Patient Account Number: [id number] Date of Birth/Sex: Dec 17, 1945 (70 y.o. Female) Treating RN: Curtis Sites Primary Care Provider: Milon Score Other Clinician: Referring Provider: Treating Provider/Extender: Rudene Re in Treatment: 13 Diagnosis Coding ICD-10 Codes Code Description E11.622 Type 2 diabetes mellitus with other skin ulcer M70.861 Other soft tissue disorders related to use, overuse and pressure, right lower leg L97.212 Non-pressure chronic ulcer of right calf with fat layer exposed F17.218 Nicotine dependence, cigarettes, with other nicotine-induced disorders Facility Procedures CPT4: Description Modifier Quantity Code [id number] 11042 - DEB SUBQ TISSUE 20 SQ CM/< 1 ICD-10 Description Diagnosis E11.622 Type 2 diabetes mellitus with other skin ulcer M70.861 Other soft tissue disorders related to use, overuse and pressure, right  lower leg L97.212 Non-pressure chronic ulcer of right calf with fat layer exposed Physician Procedures CPT4: Description Modifier Quantity Code [id number] 11042 - WC PHYS SUBQ TISS 20 SQ CM 1 ICD-10 Description Diagnosis E11.622 Type 2 diabetes mellitus with other skin ulcer M70.861 Other soft tissue disorders related to use, overuse and pressure, right  lower leg L97.212 Non-pressure chronic ulcer of right calf with fat layer exposed Electronic Signature(s) Signed: 12/13/2016 2:09:25 PM By: Evlyn Kanner MD, FACS Entered By: Evlyn Kanner on 12/13/2016 14:09:24

## 2016-12-15 NOTE — Progress Notes (Signed)
[name], Makita [name]. ([id number]) Visit Report for 12/13/2016 Arrival Information Details [name], [name]. Date of Service: 12/13/2016 1:30 PM Medical Record Number: [id number] Patient Account Number: [id number] Date of Birth/Sex: [birth year] (70 y.o. Female) Treating RN: Curtis Sitesorthy, Joanna Primary Care Rayland Hamed: Milon ScoreJONES, CARON Other Clinician: Referring Jamieon Lannen: Treating Shalissa Easterwood/Extender: Rudene ReBritto, Errol Weeks in Treatment: 13 Visit Information History Since Last Visit Added or deleted any medications: No Patient Arrived: Ambulatory Any new allergies or adverse reactions: No Arrival Time: 13:41 Had a fall or experienced change in No Accompanied By: self activities of daily living that may affect Transfer Assistance: None risk of falls: Patient Identification Verified: Yes Signs or symptoms of abuse/neglect since last No Secondary Verification Process Yes visito Completed: Hospitalized since last visit: No Patient Requires Transmission-Based No Has Dressing in Place as Prescribed: Yes Precautions: Pain Present Now: No Patient Has Alerts: Yes Patient Alerts: DMII Electronic Signature(s) Signed: 12/13/2016 4:43:23 PM By: Curtis Sitesorthy, Joanna Entered By: Curtis Sitesorthy, Joanna on 12/13/2016 13:44:22 [name], [name]. ([id number]) -------------------------------------------------------------------------------- Encounter Discharge Information Details [name], [name]. Date of Service: 12/13/2016 1:30 PM Medical Record Number: [id number] Patient Account Number: [id number] Date of Birth/Sex: [birth year] (70 y.o. Female) Treating RN: Curtis Sitesorthy, Joanna Primary Care Trevyon Swor: Milon ScoreJONES, CARON Other Clinician: Referring Nylee Barbuto: Treating Amyjo Mizrachi/Extender: Rudene ReBritto, Errol Weeks in Treatment: 13 Encounter Discharge Information Items Discharge Pain Level: 0 Discharge Condition: Stable Ambulatory Status: Ambulatory Discharge Destination: Home Transportation: Private Auto Accompanied By:  self Schedule Follow-up Appointment: Yes Medication Reconciliation completed and provided to Patient/Care No Micole Delehanty: Provided on Clinical Summary of Care: 12/13/2016 Form Type Recipient Paper Patient ML Electronic Signature(s) Signed: 12/13/2016 2:17:26 PM By: Curtis Sitesorthy, Joanna Previous Signature: 12/13/2016 2:07:07 PM Version By: Gwenlyn PerkingMoore, Shelia Entered By: Curtis Sitesorthy, Joanna on 12/13/2016 14:17:26 [name], [name]. ([id number]) -------------------------------------------------------------------------------- Lower Extremity Assessment Details [name], [name]. Date of Service: 12/13/2016 1:30 PM Medical Record Number: [id number] Patient Account Number: [id number] Date of Birth/Sex: [birth year] (70 y.o. Female) Treating RN: Curtis Sitesorthy, Joanna Primary Care Prisilla Kocsis: Milon ScoreJONES, CARON Other Clinician: Referring Rhodesia Stanger: Treating Bonnie Roig/Extender: Rudene ReBritto, Errol Weeks in Treatment: 13 Vascular Assessment Pulses: Dorsalis Pedis Palpable: [Right:Yes] Posterior Tibial Extremity colors, hair growth, and conditions: Extremity Color: [Right:Normal] Hair Growth on Extremity: [Right:Yes] Temperature of Extremity: [Right:Warm] Capillary Refill: [Right:< 3 seconds] Electronic Signature(s) Signed: 12/13/2016 4:43:23 PM By: Curtis Sitesorthy, Joanna Entered By: Curtis Sitesorthy, Joanna on 12/13/2016 13:58:41 [name], [name]. ([id number]) -------------------------------------------------------------------------------- Multi Wound Chart Details [name], [name]. Date of Service: 12/13/2016 1:30 PM Medical Record Number: [id number] Patient Account Number: [id number] Date of Birth/Sex: [birth year] (70 y.o. Female) Treating RN: Curtis Sitesorthy, Joanna Primary Care Nakia Koble: Milon ScoreJONES, CARON Other Clinician: Referring Pearl Bents: Treating Kody Vigil/Extender: Rudene ReBritto, Errol Weeks in Treatment: 13 Vital Signs Height(in): 67 Pulse(bpm): 66 Weight(lbs): 197 Blood Pressure 145/54 (mmHg): Body Mass Index(BMI): 31 Temperature(F):  98.3 Respiratory Rate 18 (breaths/min): Photos: [1:No Photos] [N/A:N/A] Wound Location: [1:Right Lower Leg - Anterior, Proximal] [N/A:N/A] Wounding Event: [1:Trauma] [N/A:N/A] Primary Etiology: [1:Trauma, Other] [N/A:N/A] Comorbid History: [1:Chronic Obstructive Pulmonary Disease (COPD), Hypertension, Type II Diabetes] [N/A:N/A] Date Acquired: [1:08/17/2016] [N/A:N/A] Weeks of Treatment: [1:13] [N/A:N/A] Wound Status: [1:Open] [N/A:N/A] Measurements L x W x D 1.2x0.3x0.1 [N/A:N/A] (cm) Area (cm) : [1:0.283] [N/A:N/A] Volume (cm) : [1:0.028] [N/A:N/A] % Reduction in Area: [1:92.30%] [N/A:N/A] % Reduction in Volume: 92.30% [N/A:N/A] Starting Position 1 6 (o'clock): Ending Position 1 [1:12] (o'clock): Maximum Distance 1 0.4 (cm): Undermining: [1:Yes] [N/A:N/A] Classification: [1:Full Thickness Without Exposed Support Structures] [N/A:N/A] HBO Classification: [1:Grade 1] [N/A:N/A] Exudate Amount: [1:Medium] [N/A:N/A] Exudate Type: Serous N/A  N/A Exudate Color: amber N/A N/A Wound Margin: Epibole N/A N/A Granulation Amount: Large (67-100%) N/A N/A Granulation Quality: Red, Hyper-granulation N/A N/A Necrotic Amount: None Present (0%) N/A N/A Exposed Structures: Fat Layer (Subcutaneous N/A N/A Tissue) Exposed: Yes Fascia: No Tendon: No Muscle: No Joint: No Bone: No Epithelialization: Small (1-33%) N/A N/A Debridement: Debridement (16109- N/A N/A 11047) Pre-procedure 13:56 N/A N/A Verification/Time Out Taken: Pain Control: Lidocaine 4% Topical N/A N/A Solution Tissue Debrided: Fibrin/Slough, N/A N/A Subcutaneous Level: Skin/Subcutaneous N/A N/A Tissue Debridement Area (sq 0.36 N/A N/A cm): Instrument: Curette N/A N/A Bleeding: Minimum N/A N/A Hemostasis Achieved: Pressure N/A N/A Procedural Pain: 0 N/A N/A Post Procedural Pain: 0 N/A N/A Debridement Treatment Procedure was tolerated N/A N/A Response: well Post Debridement 1.2x0.3x0.1 N/A N/A Measurements L x W  x D (cm) Post Debridement 0.028 N/A N/A Volume: (cm) Periwound Skin Texture: Scarring: Yes N/A N/A Excoriation: No Induration: No Callus: No Crepitus: No Rash: No Periwound Skin Maceration: No N/A N/A Moisture: Dry/Scaly: No Periwound Skin Color: Ecchymosis: Yes N/A N/A Atrophie Blanche: No Cyanosis: No [place], Song [name]. ([id number]) Erythema: No Hemosiderin Staining: No Mottled: No Pallor: No Rubor: No Temperature: No Abnormality N/A N/A Tenderness on Yes N/A N/A Palpation: Wound Preparation: Ulcer Cleansing: N/A N/A Rinsed/Irrigated with Saline Topical Anesthetic Applied: Other: lidocaine 4% Procedures Performed: Debridement N/A N/A Treatment Notes Electronic Signature(s) Signed: 12/13/2016 2:05:37 PM By: Evlyn Kanner MD, FACS Entered By: Evlyn Kanner on 12/13/2016 14:05:37 JENICE, LEINER ([id number]) -------------------------------------------------------------------------------- Multi-Disciplinary Care Plan Details Patient Name: Janet Simpers Date of Service: 12/13/2016 1:30 PM Medical Record Number: [id number] Patient Account Number: [id number] Date of Birth/Sex: 1946/06/24 (70 y.o. Female) Treating RN: Curtis Sites Primary Care Orra Nolde: Milon Score Other Clinician: Referring Chynna Buerkle: Treating Maydell Knoebel/Extender: Rudene Re in Treatment: 13 Active Inactive ` Necrotic Tissue Nursing Diagnoses: Impaired tissue integrity related to necrotic/devitalized tissue Goals: Necrotic/devitalized tissue will be minimized in the wound bed Date Initiated: 09/13/2016 Target Resolution Date: 10/18/2016 Goal Status: Active Interventions: Assess patient pain level pre-, during and post procedure and prior to discharge Notes: ` Orientation to the Wound Care Program Nursing Diagnoses: Knowledge deficit related to the wound healing center program Goals: Patient/caregiver will verbalize understanding of the Wound Healing Center Program Date Initiated:  09/13/2016 Target Resolution Date: 12/20/2016 Goal Status: Active Interventions: Provide education on orientation to the wound center Notes: ` Wound/Skin Impairment Nursing Diagnoses: Impaired tissue integrity TOTIANA, EVERSON ([id number]) Goals: Patient/caregiver will verbalize understanding of skin care regimen Date Initiated: 09/13/2016 Target Resolution Date: 12/20/2016 Goal Status: Active Ulcer/skin breakdown will have a volume reduction of 30% by week 4 Date Initiated: 09/13/2016 Target Resolution Date: 12/20/2016 Goal Status: Active Ulcer/skin breakdown will have a volume reduction of 50% by week 8 Date Initiated: 09/13/2016 Target Resolution Date: 12/20/2016 Goal Status: Active Ulcer/skin breakdown will have a volume reduction of 80% by week 12 Date Initiated: 09/13/2016 Target Resolution Date: 12/20/2016 Goal Status: Active Ulcer/skin breakdown will heal within 14 weeks Date Initiated: 09/13/2016 Target Resolution Date: 12/20/2016 Goal Status: Active Interventions: Assess patient/caregiver ability to obtain necessary supplies Assess patient/caregiver ability to perform ulcer/skin care regimen upon admission and as needed Assess ulceration(s) every visit Notes: Electronic Signature(s) Signed: 12/13/2016 4:43:23 PM By: Curtis Sites Entered By: Curtis Sites on 12/13/2016 13:58:46 Janet Simpers ([id number]) -------------------------------------------------------------------------------- Pain Assessment Details Patient Name: Janet Simpers Date of Service: 12/13/2016 1:30 PM Medical Record Number: [id number] Patient Account Number: [id number] Date of Birth/Sex: [birth year] (70 y.o. Female) Treating RN: Curtis Sites  Primary Care Airica Schwartzkopf: Milon Score Other Clinician: Referring Maciej Schweitzer: Treating Bindu Docter/Extender: Rudene Re in Treatment: 13 Active Problems Location of Pain Severity and Description of Pain Patient Has Paino No Site Locations Pain Management and  Medication Current Pain Management: Notes Topical or injectable lidocaine is offered to patient for acute pain when surgical debridement is performed. If needed, Patient is instructed to use over the counter pain medication for the following 24-48 hours after debridement. Wound care MDs do not prescribed pain medications. Patient has chronic pain or uncontrolled pain. Patient has been instructed to make an appointment with their Primary Care Physician for pain management. Electronic Signature(s) Signed: 12/13/2016 4:43:23 PM By: Curtis Sites Entered By: Curtis Sites on 12/13/2016 13:44:38 Janet Simpers ([id number]) -------------------------------------------------------------------------------- Patient/Caregiver Education Details Patient Name: Janet Simpers Date of Service: 12/13/2016 1:30 PM Medical Record Number: [id number] Patient Account Number: [id number] Date of Birth/Gender: April 23, 1946 (70 y.o. Female) Treating RN: Curtis Sites Primary Care Physician: Milon Score Other Clinician: Referring Physician: Treating Physician/Extender: Rudene Re in Treatment: 13 Education Assessment Education Provided To: Patient Education Topics Provided Wound/Skin Impairment: Handouts: Other: wound care as ordered Methods: Demonstration, Explain/Verbal Responses: State content correctly Electronic Signature(s) Signed: 12/13/2016 4:43:23 PM By: Curtis Sites Entered By: Curtis Sites on 12/13/2016 14:18:01 Janet Simpers ([id number]) -------------------------------------------------------------------------------- Wound Assessment Details Patient Name: Janet Simpers Date of Service: 12/13/2016 1:30 PM Medical Record Number: [id number] Patient Account Number: [id number] Date of Birth/Sex: 03/25/1946 (70 y.o. Female) Treating RN: Curtis Sites Primary Care Auden Wettstein: Milon Score Other Clinician: Referring Cannen Dupras: Treating Keli Buehner/Extender: Rudene Re in  Treatment: 13 Wound Status Wound Number: 1 Primary Trauma, Other Etiology: Wound Location: Right Lower Leg - Anterior, Proximal Wound Open Status: Wounding Event: Trauma Comorbid Chronic Obstructive Pulmonary Date Acquired: 08/17/2016 History: Disease (COPD), Hypertension, Type Weeks Of Treatment: 13 II Diabetes Clustered Wound: No Photos Photo Uploaded By: Curtis Sites on 12/13/2016 15:14:13 Wound Measurements Length: (cm) 1.2 Width: (cm) 0.3 Depth: (cm) 0.1 Area: (cm) 0.283 Volume: (cm) 0.028 % Reduction in Area: 92.3% % Reduction in Volume: 92.3% Epithelialization: Small (1-33%) Tunneling: No Undermining: Yes Starting Position (o'clock): 6 Ending Position (o'clock): 12 Maximum Distance: (cm) 0.4 Wound Description Full Thickness Without Classification: Exposed Support Structures Diabetic Severity Grade 1 (Wagner): Wound Margin: Epibole Exudate Amount: Medium Spoelstra, Kylar [name]. ([id number]) Foul Odor After Cleansing: No Slough/Fibrino No Exudate Type: Serous Exudate Color: amber Wound Bed Granulation Amount: Large (67-100%) Exposed Structure Granulation Quality: Red, Hyper-granulation Fascia Exposed: No Necrotic Amount: None Present (0%) Fat Layer (Subcutaneous Tissue) Exposed: Yes Tendon Exposed: No Muscle Exposed: No Joint Exposed: No Bone Exposed: No Periwound Skin Texture Texture Color No Abnormalities Noted: No No Abnormalities Noted: No Callus: No Atrophie Blanche: No Crepitus: No Cyanosis: No Excoriation: No Ecchymosis: Yes Induration: No Erythema: No Rash: No Hemosiderin Staining: No Scarring: Yes Mottled: No Pallor: No Moisture Rubor: No No Abnormalities Noted: No Dry / Scaly: No Temperature / Pain Maceration: No Temperature: No Abnormality Tenderness on Palpation: Yes Wound Preparation Ulcer Cleansing: Rinsed/Irrigated with Saline Topical Anesthetic Applied: Other: lidocaine 4%, Treatment Notes Wound #1 (Right, Proximal,  Anterior Lower Leg) 1. Cleansed with: Clean wound with Normal Saline 2. Anesthetic Topical Lidocaine 4% cream to wound bed prior to debridement 4. Dressing Applied: Other dressing (specify in notes) 5. Secondary Dressing Applied Dry Gauze 7. Secured with Tape Notes Sorbact, Gauze to bolster MORELIA, CASSELLS ([id number]) Electronic Signature(s) Signed: 12/13/2016 4:43:23 PM By: Curtis Sites Entered By: Curtis Sites on 12/13/2016 13:59:28 Tolbert,  RHEANNA SERGENT ([id number]) -------------------------------------------------------------------------------- Vitals Details Patient Name: Janet Simpers Date of Service: 12/13/2016 1:30 PM Medical Record Number: [id number] Patient Account Number: [id number] Date of Birth/Sex: 05-25-1946 (70 y.o. Female) Treating RN: Curtis Sites Primary Care Misk Galentine: Milon Score Other Clinician: Referring Draya Felker: Treating Schawn Byas/Extender: Rudene Re in Treatment: 13 Vital Signs Time Taken: 13:45 Temperature (F): 98.3 Height (in): 67 Pulse (bpm): 66 Weight (lbs): 197 Respiratory Rate (breaths/min): 18 Body Mass Index (BMI): 30.9 Blood Pressure (mmHg): 145/54 Reference Range: 80 - 120 mg / dl Electronic Signature(s) Signed: 12/13/2016 4:43:23 PM By: Curtis Sites Entered By: Curtis Sites on 12/13/2016 13:45:37

## 2016-12-20 ENCOUNTER — Encounter: Payer: Medicare Other | Admitting: Surgery

## 2016-12-20 DIAGNOSIS — E11622 Type 2 diabetes mellitus with other skin ulcer: Secondary | ICD-10-CM | POA: Diagnosis not present

## 2016-12-22 NOTE — Progress Notes (Signed)
[name], [name] ([id number]) Visit Report for 12/20/2016 Arrival Information Details [name], [name] Date of Service: 12/20/2016 3:30 PM Medical Record Number: [id number] Patient Account Number: [id number] Date of Birth/Sex: 05/20/1946 (70 y.o. Female) Treating RN: Huel CoventryWoody, Kim Primary Care Amyia Lodwick: Milon ScoreJONES, [name] Other Clinician: Referring Sivan Cuello: Treating Jasemine [name]: [name], [name]: 14 Visit Information History Since Last Visit Added or deleted any medications: No Patient Arrived: Ambulatory Any new allergies or adverse reactions: No Arrival Time: 15:17 Had a fall or experienced change in No Accompanied By: self activities of daily living that may affect Transfer Assistance: None risk of falls: Patient Identification Verified: Yes Signs or symptoms of abuse/neglect since last No Secondary Verification Process Yes visito Completed: Hospitalized since last visit: No Patient Requires Transmission-Based No Has Dressing in Place as Prescribed: Yes Precautions: Pain Present Now: No Patient Has Alerts: Yes Electronic Signature(s) Signed: 12/20/2016 4:43:58 PM By: Elliot GurneyWoody, BSN, RN, CWS, Kim RN, BSN Entered By: Elliot GurneyWoody, BSN, RN, CWS, Kim on 12/20/2016 15:17:46 [name], Tom C. ([id number]) -------------------------------------------------------------------------------- Clinic Level of Care Assessment Details [name], [name] Date of Service: 12/20/2016 3:30 PM Medical Record Number: [id number] Patient Account Number: [id number] Date of Birth/Sex: 05/20/1946 (70 y.o. Female) Treating RN: Huel CoventryWoody, Kim Primary Care Abdiel Blackerby: Milon ScoreJONES, [name] Other Clinician: Referring Elani Delph: Treating Jameisha Stofko/Extender: [name], [name]: 14 Clinic Level of Care Assessment Items TOOL 4 Quantity Score []  - Use when only an EandM is performed on FOLLOW-UP visit 0 ASSESSMENTS - Nursing Assessment / Reassessment []  - Reassessment of Co-morbidities  (includes updates in patient status) 0 X - Reassessment of Adherence to [name] Plan 1 5 ASSESSMENTS - Wound and Skin Assessment / Reassessment X - Simple Wound Assessment / Reassessment - one wound 1 5 []  - Complex Wound Assessment / Reassessment - multiple wounds 0 []  - Dermatologic / Skin Assessment (not related to wound area) 0 ASSESSMENTS - Focused Assessment []  - Circumferential Edema Measurements - multi extremities 0 []  - Nutritional Assessment / Counseling / Intervention 0 []  - Lower Extremity Assessment (monofilament, tuning fork, pulses) 0 []  - Peripheral Arterial Disease Assessment (using hand held doppler) 0 ASSESSMENTS - Ostomy and/or Continence Assessment and Care []  - Incontinence Assessment and Management 0 []  - Ostomy Care Assessment and Management (repouching, etc.) 0 PROCESS - Coordination of Care X - Simple Patient / Family Education for ongoing care 1 15 []  - Complex (extensive) Patient / Family Education for ongoing care 0 []  - Staff obtains ChiropractorConsents, Records, Test Results / Process Orders 0 []  - Staff telephones HHA, Nursing Homes / Clarify orders / etc 0 []  - Routine Transfer to another Facility (non-emergent condition) 0 [name], [name] ([id number]) []  - Routine Hospital Admission (non-emergent condition) 0 []  - New Admissions / Manufacturing engineernsurance Authorizations / Ordering NPWT, Apligraf, etc. 0 []  - Emergency Hospital Admission (emergent condition) 0 X - Simple Discharge Coordination 1 10 []  - Complex (extensive) Discharge Coordination 0 PROCESS - Special Needs []  - Pediatric / Minor Patient Management 0 []  - Isolation Patient Management 0 []  - Hearing / Language / Visual special needs 0 []  - Assessment of Community assistance (transportation, D/C planning, etc.) 0 []  - Additional assistance / Altered mentation 0 []  - Support Surface(s) Assessment (bed, cushion, seat, etc.) 0 INTERVENTIONS - Wound Cleansing / Measurement X - Simple Wound Cleansing - one wound 1  5 []  - Complex Wound Cleansing - multiple wounds 0 X - Wound Imaging (photographs - any number of wounds) 1 5 []  -  Wound Tracing (instead of photographs) 0 X - Simple Wound Measurement - one wound 1 5 []  - Complex Wound Measurement - multiple wounds 0 INTERVENTIONS - Wound Dressings X - Small Wound Dressing one or multiple wounds 1 10 []  - Medium Wound Dressing one or multiple wounds 0 []  - Large Wound Dressing one or multiple wounds 0 []  - Application of Medications - topical 0 []  - Application of Medications - injection 0 INTERVENTIONS - Miscellaneous []  - External ear exam 0 [name], [name] ([id number]) []  - Specimen Collection (cultures, biopsies, blood, body fluids, etc.) 0 []  - Specimen(s) / Culture(s) sent or taken to Lab for analysis 0 []  - Patient Transfer (multiple staff / Michiel Sites Lift / Similar devices) 0 []  - Simple Staple / Suture removal (25 or less) 0 []  - Complex Staple / Suture removal (26 or more) 0 []  - Hypo / Hyperglycemic Management (close monitor of Blood Glucose) 0 []  - Ankle / Brachial Index (ABI) - do not check if billed separately 0 X - Vital Signs 1 5 Has the patient been seen at the hospital within the last three years: Yes Total Score: 65 Level Of Care: New/Established - Level 2 Electronic Signature(s) Signed: 12/20/2016 4:43:58 PM By: Elliot Gurney, BSN, RN, CWS, Kim RN, BSN Entered By: Elliot Gurney, BSN, RN, CWS, Kim on 12/20/2016 16:08:15 [name] ([id number]) -------------------------------------------------------------------------------- Lower Extremity Assessment Details [name] Date of Service: 12/20/2016 3:30 PM Medical Record Number: [id number] Patient Account Number: [id number] Date of Birth/Sex: 1945-11-22 (70 y.o. Female) Treating RN: Huel Coventry Primary Care Garland Hincapie: Milon Score Other Clinician: Referring Lafaye Mcelmurry: Treating Hilmar Moldovan/Extender: Janet Re in [name]: 14 Edema Assessment Assessed: [Left: No] [Right:  No] Edema: [Left: N] [Right: o] Vascular Assessment Pulses: Dorsalis Pedis Palpable: [Right:Yes] Posterior Tibial Palpable: [Right:Yes] Extremity colors, hair growth, and conditions: Extremity Color: [Right:Mottled] Hair Growth on Extremity: [Right:Yes] Temperature of Extremity: [Right:Warm] Capillary Refill: [Right:< 3 seconds] Dependent Rubor: [Right:No] Blanched when Elevated: [Right:No] Lipodermatosclerosis: [Right:No] Toe Nail Assessment Left: Right: Thick: No Discolored: No Deformed: No Improper Length and Hygiene: No Electronic Signature(s) Signed: 12/20/2016 4:43:58 PM By: Elliot Gurney, BSN, RN, CWS, Kim RN, BSN Entered By: Elliot Gurney, BSN, RN, CWS, Kim on 12/20/2016 15:24:15 [name] ([id number]) -------------------------------------------------------------------------------- Multi Wound Chart Details [name] Date of Service: 12/20/2016 3:30 PM Medical Record Number: [id number] Patient Account Number: [id number] Date of Birth/Sex: 25-Dec-1945 (70 y.o. Female) Treating RN: Huel Coventry Primary Care Javyon Fontan: Milon Score Other Clinician: Referring Kenan Moodie: Treating Chinyere Galiano/Extender: Janet Re in [name]: 14 Vital Signs Height(in): 67 Pulse(bpm): 79 Weight(lbs): 197 Blood Pressure 162/67 (mmHg): Body Mass Index(BMI): 31 Temperature(F): 97.7 Respiratory Rate 16 (breaths/min): Photos: [N/A:N/A] Wound Location: Right Lower Leg - N/A N/A Anterior, Proximal Wounding Event: Trauma N/A N/A Primary Etiology: Trauma, Other N/A N/A Comorbid History: Chronic Obstructive N/A N/A Pulmonary Disease (COPD), Hypertension, Type II Diabetes Date Acquired: 08/17/2016 N/A N/A [name] of [name]: 14 N/A N/A Wound Status: Open N/A N/A Measurements L x W x D 1x0.5x0.1 N/A N/A (cm) Area (cm) : 0.393 N/A N/A Volume (cm) : 0.039 N/A N/A % Reduction in Area: 89.20% N/A N/A % Reduction in Volume: 89.30% N/A N/A Classification: Full Thickness  Without N/A N/A Exposed Support Structures HBO Classification: Grade 1 N/A N/A Exudate Amount: Medium N/A N/A Exudate Type: Serous N/A N/A Exudate Color: amber N/A N/A Carlin, Ortha C. ([id number]) Wound Margin: Epibole N/A N/A Granulation Amount: None Present (0%) N/A N/A Necrotic Amount: Large (67-100%) N/A N/A  Necrotic Tissue: Eschar N/A N/A Exposed Structures: Fascia: No N/A N/A Fat Layer (Subcutaneous Tissue) Exposed: No Tendon: No Muscle: No Joint: No Bone: No Epithelialization: None N/A N/A Periwound Skin Texture: Scarring: Yes N/A N/A Excoriation: No Induration: No Callus: No Crepitus: No Rash: No Periwound Skin Maceration: No N/A N/A Moisture: Dry/Scaly: No Periwound Skin Color: Ecchymosis: Yes N/A N/A Atrophie Blanche: No Cyanosis: No Erythema: No Hemosiderin Staining: No Mottled: No Pallor: No Rubor: No Temperature: No Abnormality N/A N/A Tenderness on Yes N/A N/A Palpation: Wound Preparation: Ulcer Cleansing: N/A N/A Rinsed/Irrigated with Saline Topical Anesthetic Applied: Other: lidocaine 4% [name] Notes Electronic Signature(s) Signed: 12/20/2016 4:06:28 PM By: Elliot Gurney, BSN, RN, CWS, Kim RN, BSN Previous Signature: 12/20/2016 4:02:16 PM Version By: Evlyn Kanner MD, FACS Entered By: Elliot Gurney BSN, RN, CWS, Kim on 12/20/2016 16:06:28 NYONNA, HARGROVE ([id number]) -------------------------------------------------------------------------------- Multi-Disciplinary Care Plan Details [name] Date of Service: 12/20/2016 3:30 PM Medical Record Number: [id number] Patient Account Number: [id number] Date of Birth/Sex: 12-21-1945 (70 y.o. Female) Treating RN: Huel Coventry Primary Care Demarcus Thielke: Milon Score Other Clinician: Referring Chanler Mendonca: Treating Tylar Merendino/Extender: Janet Re in [name]: 14 Active Inactive ` Necrotic Tissue Nursing Diagnoses: Impaired tissue integrity related to necrotic/devitalized  tissue Goals: Necrotic/devitalized tissue will be minimized in the wound bed Date Initiated: 09/13/2016 Target Resolution Date: 10/18/2016 Goal Status: Active Interventions: Assess patient pain level pre-, during and post procedure and prior to discharge Notes: ` Orientation to the Wound Care Program Nursing Diagnoses: Knowledge deficit related to the wound healing center program Goals: Patient/caregiver will verbalize understanding of the Wound Healing Center Program Date Initiated: 09/13/2016 Target Resolution Date: 12/20/2016 Goal Status: Active Interventions: Provide education on orientation to the wound center Notes: ` Wound/Skin Impairment Nursing Diagnoses: Impaired tissue integrity JIAYI, LENGACHER ([id number]) Goals: Patient/caregiver will verbalize understanding of skin care regimen Date Initiated: 09/13/2016 Target Resolution Date: 12/20/2016 Goal Status: Active Ulcer/skin breakdown will have a volume reduction of 30% by week 4 Date Initiated: 09/13/2016 Target Resolution Date: 12/20/2016 Goal Status: Active Ulcer/skin breakdown will have a volume reduction of 50% by week 8 Date Initiated: 09/13/2016 Target Resolution Date: 12/20/2016 Goal Status: Active Ulcer/skin breakdown will have a volume reduction of 80% by week 12 Date Initiated: 09/13/2016 Target Resolution Date: 12/20/2016 Goal Status: Active Ulcer/skin breakdown will heal within 14 [name] Date Initiated: 09/13/2016 Target Resolution Date: 12/20/2016 Goal Status: Active Interventions: Assess patient/caregiver ability to obtain necessary supplies Assess patient/caregiver ability to perform ulcer/skin care regimen upon admission and as needed Assess ulceration(s) every visit Notes: Electronic Signature(s) Signed: 12/20/2016 4:06:16 PM By: Elliot Gurney, BSN, RN, CWS, Kim RN, BSN Entered By: Elliot Gurney, BSN, RN, CWS, Kim on 12/20/2016 16:06:16 [name]  ([id number]) -------------------------------------------------------------------------------- Pain Assessment Details [name] Date of Service: 12/20/2016 3:30 PM Medical Record Number: [id number] Patient Account Number: [id number] Date of Birth/Sex: 1946-04-05 (70 y.o. Female) Treating RN: Huel Coventry Primary Care Creasie Lacosse: Milon Score Other Clinician: Referring Yaeko Fazekas: Treating Katoria Yetman/Extender: Janet Re in [name]: 14 Active Problems Location of Pain Severity and Description of Pain Patient Has Paino No Site Locations With Dressing Change: No Pain Management and Medication Current Pain Management: Electronic Signature(s) Signed: 12/20/2016 4:43:58 PM By: Elliot Gurney, BSN, RN, CWS, Kim RN, BSN Entered By: Elliot Gurney, BSN, RN, CWS, Kim on 12/20/2016 15:17:53 [name] ([id number]) -------------------------------------------------------------------------------- Wound Assessment Details [name] Date of Service: 12/20/2016 3:30 PM Medical Record Number: [id number] Patient Account Number: [id number] Date of Birth/Sex: [birth year] (70 y.o.  Female) Treating RN: Huel Coventry Primary Care Lasonja Lakins: Milon Score Other Clinician: Referring Ellanor Feuerstein: Treating Makara Lanzo/Extender: Janet Re in [name]: 14 Wound Status Wound Number: 1 Primary Trauma, Other Etiology: Wound Location: Right Lower Leg - Anterior, Proximal Wound Open Status: Wounding Event: Trauma Comorbid Chronic Obstructive Pulmonary Date Acquired: 08/17/2016 History: Disease (COPD), Hypertension, Type [name] Of [name]: 14 II Diabetes Clustered Wound: No Photos Wound Measurements Length: (cm) 1 Width: (cm) 0.5 Depth: (cm) 0.1 Area: (cm) 0.393 Volume: (cm) 0.039 % Reduction in Area: 89.2% % Reduction in Volume: 89.3% Epithelialization: None Tunneling: No Undermining: No Wound Description Full Thickness Without Foul Odor Aft Classification: Exposed  Support Structures Slough/Fibrin Diabetic Severity Grade 1 (Wagner): Wound Margin: Epibole Exudate Amount: Medium Exudate Type: Serous Exudate Color: amber er Cleansing: No o No Wound Bed Granulation Amount: None Present (0%) Exposed Structure Necrotic Amount: Large (67-100%) Fascia Exposed: No Necrotic Quality: Eschar Fat Layer (Subcutaneous Tissue) Exposed: No Tendon Exposed: No Mccrory, Jaeline C. ([id number]) Muscle Exposed: No Joint Exposed: No Bone Exposed: No Periwound Skin Texture Texture Color No Abnormalities Noted: No No Abnormalities Noted: No Callus: No Atrophie Blanche: No Crepitus: No Cyanosis: No Excoriation: No Ecchymosis: Yes Induration: No Erythema: No Rash: No Hemosiderin Staining: No Scarring: Yes Mottled: No Pallor: No Moisture Rubor: No No Abnormalities Noted: No Dry / Scaly: No Temperature / Pain Maceration: No Temperature: No Abnormality Tenderness on Palpation: Yes Wound Preparation Ulcer Cleansing: Rinsed/Irrigated with Saline Topical Anesthetic Applied: Other: lidocaine 4%, [name] Notes Wound #1 (Right, Proximal, Anterior Lower Leg) 1. Cleansed with: Clean wound with Normal Saline 2. Anesthetic Topical Lidocaine 4% cream to wound bed prior to debridement 4. Dressing Applied: Other dressing (specify in notes) 5. Secondary Dressing Applied Foam Notes steri-strips, ABD and conform to secure. Electronic Signature(s) Signed: 12/20/2016 4:43:58 PM By: Elliot Gurney, BSN, RN, CWS, Kim RN, BSN Entered By: Elliot Gurney, BSN, RN, CWS, Kim on 12/20/2016 15:23:28 AKERA, SNOWBERGER ([id number]) -------------------------------------------------------------------------------- Vitals Details [name] Date of Service: 12/20/2016 3:30 PM Medical Record Number: [id number] Patient Account Number: [id number] Date of Birth/Sex: 01-19-1946 (70 y.o. Female) Treating RN: Huel Coventry Primary Care Francely Craw: Milon Score Other  Clinician: Referring Montey Ebel: Treating Xzavior Reinig/Extender: Janet Re in [name]: 14 Vital Signs Time Taken: 15:17 Temperature (F): 97.7 Height (in): 67 Pulse (bpm): 79 Weight (lbs): 197 Respiratory Rate (breaths/min): 16 Body Mass Index (BMI): 30.9 Blood Pressure (mmHg): 162/67 Reference Range: 80 - 120 mg / dl Electronic Signature(s) Signed: 12/20/2016 4:43:58 PM By: Elliot Gurney, BSN, RN, CWS, Kim RN, BSN Entered By: Elliot Gurney, BSN, RN, CWS, Kim on 12/20/2016 15:19:33

## 2016-12-22 NOTE — Progress Notes (Addendum)
JATZIRY, WECHTER ([id number]) Visit Report for 12/20/2016 Chief Complaint Document Details [name], [name] Date of Service: 12/20/2016 3:30 PM Medical Record Number: [id number] Patient Account Number: [id number] Date of Birth/Sex: 1946-07-15 (70 y.o. Female) Treating RN: Huel Coventry Primary Care Provider: Milon Score Other Clinician: Referring Provider: Treating Provider/Extender: Rudene Re in Treatment: 14 Information Obtained from: Patient Chief Complaint patient is here for follow-up evaluation of a right lower extremity ulcer Electronic Signature(s) Signed: 12/20/2016 4:02:23 PM By: Evlyn Kanner MD, FACS Entered By: Evlyn Kanner on 12/20/2016 16:02:23 [name] ([id number]) -------------------------------------------------------------------------------- HPI Details [name] Date of Service: 12/20/2016 3:30 PM Medical Record Number: [id number] Patient Account Number: [id number] Date of Birth/Sex: October 10, 1945 (71 y.o. Female) Treating RN: Huel Coventry Primary Care Provider: Milon Score Other Clinician: Referring Provider: Treating Provider/Extender: Rudene Re in Treatment: 14 History of Present Illness Location: right lower extremity in the area of the lateral calf below the knee Quality: Patient reports experiencing a sharp pain to affected area(s) but this is not occurring at this point Severity: Patient states wound are getting better but too slowly for her liking Duration: Patient has had the wound for < 4 weeks prior to presenting for treatment Timing: Pain in wound is intermittent and less frequent these days Context: The wound occurred when the patient a blunt injury and fall Modifying Factors: Other treatment(s) tried include:courses of doxycycline and local Bactroban ointment Associated Signs and Symptoms: Patient reports having increase discharge. HPI Description: 71 year old patient has been referred to as with  right lower extremity wound with edema and ecchymosis, after having a injury due to a fall on 08/20/2016. she has a history of diabetes mellitus type 2 which is uncomplicated, chronic back pain, COPD, hypertension, hypothyroidism, supraventricular tachycardia and tobacco abuse. He is status post neck surgery, appendectomy, cholecystectomy, hernia repair. He smokes about half a pack of cigarettes a day for the last 50 years. Recently on doxycycline for 10 days and was given symptomatic treatment for wound. 10/04/2016 -- the patient tolerated the snap vacuum system well and had no significant problems. She continues to smoke and is trying to give it up. 10/11/2016 -- she continues to tolerate the snap like him system fairly well and other than that she has no fresh issues 11/01/16- she is here for follow-up evaluation of her right lower extremity ulcer. She is tolerating the SNAP negative pressure system. She is voicing no complaints of pain. She states her glucose levels are consistently less than 150 with her evening glucose levels being higher than morning glucose levels. She continues to smoke, approximately 0.5-ppd which is a reduction of 1-ppd approximately 6 months ago. 11/08/16 patient on evaluation today fortunately does not appear to be having as much discomfort as she has in the past. She is tolerating the dressing changes well although she does note some irritation where the wound VAC has been applied. She specifically wonders if we could see about taking a break possibly progressing to discontinuation of the wound VAC. There is no evidence of infection. 12/20/2016 -- we had prepared to use a Grafix skin substitute today but her wound had improved remarkably and we did not use this Electronic Signature(s) Signed: 12/20/2016 4:02:51 PM By: Evlyn Kanner MD, FACS Entered By: Evlyn Kanner on 12/20/2016 16:02:51 [name]  ([id number]) -------------------------------------------------------------------------------- Physical Exam Details [name] Date of Service: 12/20/2016 3:30 PM Medical Record Number: [id number] Patient Account Number: [id number] Date of  Birth/Sex: 09-23-1945 (71 y.o. Female) Treating RN: Huel Coventry Primary Care Provider: Milon Score Other Clinician: Referring Provider: Treating Provider/Extender: Rudene Re in Treatment: 14 Constitutional . Pulse regular. Respirations normal and unlabored. Afebrile. . Eyes Nonicteric. Reactive to light. Ears, Nose, Mouth, and Throat Lips, teeth, and gums WNL.Marland Kitchen Moist mucosa without lesions. Neck supple and nontender. No palpable supraclavicular or cervical adenopathy. Normal sized without goiter. Respiratory WNL. No retractions.. Breath sounds WNL, No rubs, rales, rhonchi, or wheeze.. Cardiovascular Heart rhythm and rate regular, no murmur or gallop.. Pedal Pulses WNL. No clubbing, cyanosis or edema. Chest Breasts symmetical and no nipple discharge.. Breast tissue WNL, no masses, lumps, or tenderness.. Lymphatic No adneopathy. No adenopathy. No adenopathy. Musculoskeletal Adexa without tenderness or enlargement.. Digits and nails w/o clubbing, cyanosis, infection, petechiae, ischemia, or inflammatory conditions.. Integumentary (Hair, Skin) No suspicious lesions. No crepitus or fluctuance. No peri-wound warmth or erythema. No masses.Marland Kitchen Psychiatric Judgement and insight Intact.. No evidence of depression, anxiety, or agitation.. Notes the lateral part of the wound has now completely up the lyse and except for a millimeter or so between the 6 and 12:00 position, there is complete closure of the wound. I have used Telfa, form and Steri-Stripped this in place and will hold it in place over the week. Electronic Signature(s) Signed: 12/20/2016 4:03:38 PM By: Evlyn Kanner MD, FACS Entered By: Evlyn Kanner on 12/20/2016  16:03:37 [name], [name] ([id number]) -------------------------------------------------------------------------------- Physician Orders Details [name] Date of Service: 12/20/2016 3:30 PM Medical Record Number: [id number] Patient Account Number: [id number] Date of Birth/Sex: [birth year] (70 y.o. Female) Treating RN: Huel Coventry Primary Care Provider: Milon Score Other Clinician: Referring Provider: Treating Provider/Extender: Rudene Re in Treatment: 14 Verbal / Phone Orders: No Diagnosis Coding ICD-10 Coding Code Description E11.622 Type 2 diabetes mellitus with other skin ulcer M70.861 Other soft tissue disorders related to use, overuse and pressure, right lower leg L97.212 Non-pressure chronic ulcer of right calf with fat layer exposed F17.218 Nicotine dependence, cigarettes, with other nicotine-induced disorders Wound Cleansing Wound #1 Right,Proximal,Anterior Lower Leg o Clean wound with Normal Saline. Skin Barriers/Peri-Wound Care Wound #1 Right,Proximal,Anterior Lower Leg o Skin Prep Primary Wound Dressing Wound #1 Right,Proximal,Anterior Lower Leg o Other: - telfa Secondary Dressing Wound #1 Right,Proximal,Anterior Lower Leg o Foam Dressing Change Frequency Wound #1 Right,Proximal,Anterior Lower Leg o Change dressing every week Follow-up Appointments Wound #1 Right,Proximal,Anterior Lower Leg o Return Appointment in 1 week. Additional Orders / Instructions Wound #1 Right,Proximal,Anterior Lower Leg o Other: - do not get dressing wet [name], [name] ([id number]) Electronic Signature(s) Signed: 12/20/2016 4:07:39 PM By: Elliot Gurney, BSN, RN, CWS, Kim RN, BSN Signed: 12/20/2016 4:12:58 PM By: Evlyn Kanner MD, FACS Entered By: Elliot Gurney, BSN, RN, CWS, Kim on 12/20/2016 16:07:38 [name], [name] ([id number]) -------------------------------------------------------------------------------- Problem List Details [name] Date of Service: 12/20/2016 3:30 PM Medical Record Number: [id number] Patient Account Number: [id number] Date of Birth/Sex: 1945-08-25 (71 y.o. Female) Treating RN: Huel Coventry Primary Care Provider: Milon Score Other Clinician: Referring Provider: Treating Provider/Extender: Rudene Re in Treatment: 14 Active Problems ICD-10 Encounter Code Description Active Date Diagnosis E11.622 Type 2 diabetes mellitus with other skin ulcer 09/13/2016 Yes M70.861 Other soft tissue disorders related to use, overuse and 09/13/2016 Yes pressure, right lower leg L97.212 Non-pressure chronic ulcer of right calf with fat layer 09/13/2016 Yes exposed F17.218 Nicotine dependence, cigarettes, with other nicotine- 09/13/2016 Yes induced disorders Inactive Problems Resolved Problems Electronic Signature(s) Signed: 12/20/2016 4:01:47 PM By:  Evlyn Kanner MD, FACS Entered By: Evlyn Kanner on 12/20/2016 16:01:47 [name] ([id number]) -------------------------------------------------------------------------------- Progress Note Details [name] Date of Service: 12/20/2016 3:30 PM Medical Record Number: [id number] Patient Account Number: [id number] Date of Birth/Sex: 1946-03-27 (70 y.o. Female) Treating RN: Huel Coventry Primary Care Provider: Milon Score Other Clinician: Referring Provider: Treating Provider/Extender: Rudene Re in Treatment: 14 Subjective Chief Complaint Information obtained from Patient patient is here for follow-up evaluation of a right lower extremity ulcer History of Present Illness (HPI) The following HPI elements were documented for the patient's wound: Location: right lower extremity in the area of the lateral calf below the knee Quality: Patient reports experiencing a sharp pain to affected area(s) but this is not occurring at this point Severity: Patient states wound are getting better but too slowly for her liking Duration: Patient has  had the wound for < 4 weeks prior to presenting for treatment Timing: Pain in wound is intermittent and less frequent these days Context: The wound occurred when the patient a blunt injury and fall Modifying Factors: Other treatment(s) tried include:courses of doxycycline and local Bactroban ointment Associated Signs and Symptoms: Patient reports having increase discharge. 71 year old patient has been referred to as with right lower extremity wound with edema and ecchymosis, after having a injury due to a fall on 08/20/2016. she has a history of diabetes mellitus type 2 which is uncomplicated, chronic back pain, COPD, hypertension, hypothyroidism, supraventricular tachycardia and tobacco abuse. He is status post neck surgery, appendectomy, cholecystectomy, hernia repair. He smokes about half a pack of cigarettes a day for the last 50 years. Recently on doxycycline for 10 days and was given symptomatic treatment for wound. 10/04/2016 -- the patient tolerated the snap vacuum system well and had no significant problems. She continues to smoke and is trying to give it up. 10/11/2016 -- she continues to tolerate the snap like him system fairly well and other than that she has no fresh issues 11/01/16- she is here for follow-up evaluation of her right lower extremity ulcer. She is tolerating the SNAP negative pressure system. She is voicing no complaints of pain. She states her glucose levels are consistently less than 150 with her evening glucose levels being higher than morning glucose levels. She continues to smoke, approximately 0.5-ppd which is a reduction of 1-ppd approximately 6 months ago. 11/08/16 patient on evaluation today fortunately does not appear to be having as much discomfort as she has in the past. She is tolerating the dressing changes well although she does note some irritation where the wound VAC has been applied. She specifically wonders if we could see about taking a break  possibly progressing to discontinuation of the wound VAC. There is no evidence of infection. 12/20/2016 -- we had prepared to use a Grafix skin substitute today but her wound had improved [name], [name] ([id number]) remarkably and we did not use this Objective Constitutional Pulse regular. Respirations normal and unlabored. Afebrile. Vitals Time Taken: 3:17 PM, Height: 67 in, Weight: 197 lbs, BMI: 30.9, Temperature: 97.7 F, Pulse: 79 bpm, Respiratory Rate: 16 breaths/min, Blood Pressure: 162/67 mmHg. Eyes Nonicteric. Reactive to light. Ears, Nose, Mouth, and Throat Lips, teeth, and gums WNL.Marland Kitchen Moist mucosa without lesions. Neck supple and nontender. No palpable supraclavicular or cervical adenopathy. Normal sized without goiter. Respiratory WNL. No retractions.. Breath sounds WNL, No rubs, rales, rhonchi, or wheeze.. Cardiovascular Heart rhythm and rate regular, no murmur or gallop.. Pedal Pulses WNL. No clubbing, cyanosis  or edema. Chest Breasts symmetical and no nipple discharge.. Breast tissue WNL, no masses, lumps, or tenderness.. Lymphatic No adneopathy. No adenopathy. No adenopathy. Musculoskeletal Adexa without tenderness or enlargement.. Digits and nails w/o clubbing, cyanosis, infection, petechiae, ischemia, or inflammatory conditions.Marland Kitchen Psychiatric Judgement and insight Intact.. No evidence of depression, anxiety, or agitation.. General Notes: the lateral part of the wound has now completely up the lyse and except for a millimeter or so between the 6 and 12:00 position, there is complete closure of the wound. I have used Telfa, form and Steri-Stripped this in place and will hold it in place over the week. Integumentary (Hair, Skin) [name], [name] ([id number]) No suspicious lesions. No crepitus or fluctuance. No peri-wound warmth or erythema. No masses.. Wound #1 status is Open. Original cause of wound was Trauma. The wound is located on the Right,Proximal,Anterior  Lower Leg. The wound measures 1cm length x 0.5cm width x 0.1cm depth; 0.393cm^2 area and 0.039cm^3 volume. There is no tunneling or undermining noted. There is a medium amount of serous drainage noted. The wound margin is epibole. There is no granulation within the wound bed. There is a large (67-100%) amount of necrotic tissue within the wound bed including Eschar. The periwound skin appearance exhibited: Scarring, Ecchymosis. The periwound skin appearance did not exhibit: Callus, Crepitus, Excoriation, Induration, Rash, Dry/Scaly, Maceration, Atrophie Blanche, Cyanosis, Hemosiderin Staining, Mottled, Pallor, Rubor, Erythema. Periwound temperature was noted as No Abnormality. The periwound has tenderness on palpation. 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 Skin Barriers/Peri-Wound Care: Wound #1 Right,Proximal,Anterior Lower Leg: Skin Prep Primary Wound Dressing: Wound #1 Right,Proximal,Anterior Lower Leg: Other: - telfa Secondary Dressing: Wound #1 Right,Proximal,Anterior Lower Leg: Foam Dressing Change Frequency: Wound #1 Right,Proximal,Anterior Lower Leg: Change dressing every week Follow-up Appointments: Wound #1 Right,Proximal,Anterior Lower Leg: Return Appointment in 1 week. [name], [name] ([id number]) Additional Orders / Instructions: Wound #1 Right,Proximal,Anterior Lower Leg: Other: - do not get dressing wet The area between the 6 and 12:00 position has a undermining flap only very minimally about 1 mm. In view of this I have not recommended application of Grafix today I have  put an appropriate dressing in place which we shall hold for the week and hopefully she will be healed soon She is encouraged to give up smoking and continue adequate protein and vitamin supplements Electronic Signature(s) Signed: 12/20/2016 4:17:00 PM By: Evlyn Kanner MD, FACS Previous Signature: 12/20/2016 4:04:58 PM Version By: Evlyn Kanner MD, FACS Entered By: Evlyn Kanner on 12/20/2016 16:17:00 [name] ([id number]) -------------------------------------------------------------------------------- SuperBill Details [name] Date of Service: 12/20/2016 Medical Record Number: [id number] Patient Account Number: [id number] Date of Birth/Sex: March 06, 1946 (70 y.o. Female) Treating RN: Huel Coventry Primary Care Provider: Milon Score Other Clinician: Referring Provider: Treating Provider/Extender: Rudene Re in Treatment: 14 Diagnosis Coding ICD-10 Codes Code Description E11.622 Type 2 diabetes mellitus with other skin ulcer M70.861 Other soft tissue disorders related to use, overuse and pressure, right lower leg L97.212 Non-pressure chronic ulcer of right calf with fat layer exposed F17.218 Nicotine dependence, cigarettes, with other nicotine-induced disorders Facility Procedures CPT4 Code: [id number] Description: 32440 - WOUND CARE VISIT-LEV 2 EST PT Modifier: Quantity:  1 Physician Procedures CPT4: Description Modifier Quantity Code [id number] 99213 - WC PHYS LEVEL 3 - EST PT 1 ICD-10 Description Diagnosis E11.622 Type 2 diabetes mellitus with other skin ulcer M70.861 Other soft tissue disorders related to use, overuse and pressure, right lower  leg L97.212 Non-pressure chronic ulcer of right calf with fat layer exposed Electronic Signature(s) Signed: 12/20/2016 4:09:07 PM By: Elliot GurneyWoody, BSN, RN, CWS, Kim RN, BSN Signed: 12/20/2016 4:12:58 PM By: Evlyn KannerBritto, Detta Mellin MD, FACS Previous Signature: 12/20/2016 4:05:24 PM Version By: Evlyn KannerBritto, Alberto Schoch MD, FACS Previous Signature:  12/20/2016 4:05:17 PM Version By: Evlyn KannerBritto, Siddhant Hashemi MD, FACS Entered By: Elliot GurneyWoody, BSN, RN, CWS, Kim on 12/20/2016 16:09:07

## 2016-12-27 ENCOUNTER — Encounter: Payer: Medicare Other | Admitting: Surgery

## 2016-12-27 DIAGNOSIS — E11622 Type 2 diabetes mellitus with other skin ulcer: Secondary | ICD-10-CM | POA: Diagnosis not present

## 2016-12-29 NOTE — Progress Notes (Signed)
Janet SimpersLANGLEY, Damyiah C. ([id number]) Visit Report for 12/27/2016 Arrival Information Details Patient Name: Janet SimpersLANGLEY, Aubreyanna C. Date of Service: 12/27/2016 3:30 PM Medical Record Number: [id number] Patient Account Number: [id number] Date of Birth/Sex: 02-07-1946 (71 y.o. Female) Treating RN: Curtis Sitesorthy, Joanna Primary Care Ledell Codrington: Milon ScoreJONES, CARON Other Clinician: Referring Birdena Kingma: Treating Beya Tipps/Extender: Rudene ReBritto, Errol Weeks in Treatment: 15 Visit Information History Since Last Visit Added or deleted any medications: No Patient Arrived: Ambulatory Any new allergies or adverse reactions: No Arrival Time: 15:40 Had a fall or experienced change in No Accompanied By: self activities of daily living that may affect Transfer Assistance: None risk of falls: Patient Identification Verified: Yes Signs or symptoms of abuse/neglect since last No Secondary Verification Process Yes visito Completed: Hospitalized since last visit: No Patient Requires Transmission-Based No Has Dressing in Place as Prescribed: Yes Precautions: Pain Present Now: No Patient Has Alerts: Yes Electronic Signature(s) Signed: 12/27/2016 4:38:01 PM By: Curtis Sitesorthy, Joanna Entered By: Curtis Sitesorthy, Joanna on 12/27/2016 15:46:45 Janet SimpersLANGLEY, Niana C. ([id number]) -------------------------------------------------------------------------------- Clinic Level of Care Assessment Details Patient Name: Janet SimpersLANGLEY, Razia C. Date of Service: 12/27/2016 3:30 PM Medical Record Number: [id number] Patient Account Number: [id number] Date of Birth/Sex: 02-07-1946 (71 y.o. Female) Treating RN: Curtis Sitesorthy, Joanna Primary Care Yamilet Mcfayden: Milon ScoreJONES, CARON Other Clinician: Referring Ziad Maye: Treating Josiyah Tozzi/Extender: Rudene ReBritto, Errol Weeks in Treatment: 15 Clinic Level of Care Assessment Items TOOL 4 Quantity Score []  - Use when only an EandM is performed on FOLLOW-UP visit 0 ASSESSMENTS - Nursing Assessment / Reassessment X - Reassessment of Co-morbidities (includes  updates in patient status) 1 10 X - Reassessment of Adherence to Treatment Plan 1 5 ASSESSMENTS - Wound and Skin Assessment / Reassessment X - Simple Wound Assessment / Reassessment - one wound 1 5 []  - Complex Wound Assessment / Reassessment - multiple wounds 0 []  - Dermatologic / Skin Assessment (not related to wound area) 0 ASSESSMENTS - Focused Assessment []  - Circumferential Edema Measurements - multi extremities 0 []  - Nutritional Assessment / Counseling / Intervention 0 X - Lower Extremity Assessment (monofilament, tuning fork, pulses) 1 5 []  - Peripheral Arterial Disease Assessment (using hand held doppler) 0 ASSESSMENTS - Ostomy and/or Continence Assessment and Care []  - Incontinence Assessment and Management 0 []  - Ostomy Care Assessment and Management (repouching, etc.) 0 PROCESS - Coordination of Care X - Simple Patient / Family Education for ongoing care 1 15 []  - Complex (extensive) Patient / Family Education for ongoing care 0 []  - Staff obtains ChiropractorConsents, Records, Test Results / Process Orders 0 []  - Staff telephones HHA, Nursing Homes / Clarify orders / etc 0 []  - Routine Transfer to another Facility (non-emergent condition) 0 Turbyfill, Prudence C. ([id number]) []  - Routine Hospital Admission (non-emergent condition) 0 []  - New Admissions / Manufacturing engineernsurance Authorizations / Ordering NPWT, Apligraf, etc. 0 []  - Emergency Hospital Admission (emergent condition) 0 X - Simple Discharge Coordination 1 10 []  - Complex (extensive) Discharge Coordination 0 PROCESS - Special Needs []  - Pediatric / Minor Patient Management 0 []  - Isolation Patient Management 0 []  - Hearing / Language / Visual special needs 0 []  - Assessment of Community assistance (transportation, D/C planning, etc.) 0 []  - Additional assistance / Altered mentation 0 []  - Support Surface(s) Assessment (bed, cushion, seat, etc.) 0 INTERVENTIONS - Wound Cleansing / Measurement X - Simple Wound Cleansing - one wound 1 5 []  -  Complex Wound Cleansing - multiple wounds 0 X - Wound Imaging (photographs - any number of wounds) 1 5 []  - Wound Tracing (instead of  photographs) 0 X - Simple Wound Measurement - one wound 1 5 []  - Complex Wound Measurement - multiple wounds 0 INTERVENTIONS - Wound Dressings []  - Small Wound Dressing one or multiple wounds 0 []  - Medium Wound Dressing one or multiple wounds 0 []  - Large Wound Dressing one or multiple wounds 0 []  - Application of Medications - topical 0 []  - Application of Medications - injection 0 INTERVENTIONS - Miscellaneous []  - External ear exam 0 Lovelady, Shareta C. ([id number]) []  - Specimen Collection (cultures, biopsies, blood, body fluids, etc.) 0 []  - Specimen(s) / Culture(s) sent or taken to Lab for analysis 0 []  - Patient Transfer (multiple staff / Michiel Sites Lift / Similar devices) 0 []  - Simple Staple / Suture removal (25 or less) 0 []  - Complex Staple / Suture removal (26 or more) 0 []  - Hypo / Hyperglycemic Management (close monitor of Blood Glucose) 0 []  - Ankle / Brachial Index (ABI) - do not check if billed separately 0 X - Vital Signs 1 5 Has the patient been seen at the hospital within the last three years: Yes Total Score: 70 Level Of Care: New/Established - Level 2 Electronic Signature(s) Signed: 12/27/2016 4:38:01 PM By: Curtis Sites Entered By: Curtis Sites on 12/27/2016 15:59:14 [name] ([id number]) -------------------------------------------------------------------------------- Encounter Discharge Information Details [name] Date of Service: 12/27/2016 3:30 PM Medical Record Number: [id number] Patient Account Number: [id number] Date of Birth/Sex: 1946/02/26 (71 y.o. Female) Treating RN: Curtis Sites Primary Care Taylan Marez: Milon Score Other Clinician: Referring Maicee Ullman: Treating Jymir Dunaj/Extender: Rudene Re in Treatment: 15 Encounter Discharge Information Items Discharge Pain Level: 0 Discharge  Condition: Stable Ambulatory Status: Ambulatory Discharge Destination: Home Transportation: Private Auto Accompanied By: self Schedule Follow-up Appointment: No Medication Reconciliation completed and provided to Patient/Care No Jacquelin Krajewski: Provided on Clinical Summary of Care: 12/27/2016 Form Type Recipient Paper Patient ML Electronic Signature(s) Signed: 12/27/2016 4:10:53 PM By: Gwenlyn Perking Entered By: Gwenlyn Perking on 12/27/2016 16:10:53 [name] ([id number]) -------------------------------------------------------------------------------- Lower Extremity Assessment Details [name] Date of Service: 12/27/2016 3:30 PM Medical Record Number: [id number] Patient Account Number: [id number] Date of Birth/Sex: May 15, 1946 (70 y.o. Female) Treating RN: Curtis Sites Primary Care Bosten Newstrom: Milon Score Other Clinician: Referring Verdell Kincannon: Treating Aleathea Pugmire/Extender: Rudene Re in Treatment: 15 Vascular Assessment Pulses: Dorsalis Pedis Palpable: [Right:Yes] Posterior Tibial Extremity colors, hair growth, and conditions: Extremity Color: [Right:Normal] Hair Growth on Extremity: [Right:Yes] Temperature of Extremity: [Right:Warm] Capillary Refill: [Right:< 3 seconds] Electronic Signature(s) Signed: 12/27/2016 4:38:01 PM By: Curtis Sites Entered By: Curtis Sites on 12/27/2016 15:57:18 [name] ([id number]) -------------------------------------------------------------------------------- Multi Wound Chart Details [name] Date of Service: 12/27/2016 3:30 PM Medical Record Number: [id number] Patient Account Number: [id number] Date of Birth/Sex: [birth year] (70 y.o. Female) Treating RN: Curtis Sites Primary Care Breccan Galant: Milon Score Other Clinician: Referring Genavive Kubicki: Treating Nevah Dalal/Extender: Rudene Re in Treatment: 15 Vital Signs Height(in): 67 Pulse(bpm): 74 Weight(lbs): 197 Blood  Pressure 142/59 (mmHg): Body Mass Index(BMI): 31 Temperature(F): 97.8 Respiratory Rate 16 (breaths/min): Photos: [1:No Photos] [N/A:N/A] Wound Location: [1:Right, Proximal, Anterior Lower Leg] [N/A:N/A] Wounding Event: [1:Trauma] [N/A:N/A] Primary Etiology: [1:Trauma, Other] [N/A:N/A] Date Acquired: [1:08/17/2016] [N/A:N/A] Weeks of Treatment: [1:15] [N/A:N/A] Wound Status: [1:Healed - Epithelialized] [N/A:N/A] Measurements L x W x D 0x0x0 [N/A:N/A] (cm) Area (cm) : [1:0] [N/A:N/A] Volume (cm) : [1:0] [N/A:N/A] % Reduction in Area: [1:100.00%] [N/A:N/A] % Reduction in Volume: 100.00% [N/A:N/A] Classification: [1:Full Thickness Without Exposed Support Structures] [N/A:N/A] Periwound Skin Texture: No  Abnormalities Noted [N/A:N/A] Periwound Skin [1:No Abnormalities Noted] [N/A:N/A] Moisture: Periwound Skin Color: No Abnormalities Noted [N/A:N/A] Tenderness on [1:No] [N/A:N/A] Treatment Notes Electronic Signature(s) Signed: 12/27/2016 4:09:21 PM By: Evlyn Kanner MD, FACS [place], Pincus Sanes ([id number]) Entered By: Evlyn Kanner on 12/27/2016 16:09:21 IMARA, STANDIFORD ([id number]) -------------------------------------------------------------------------------- Multi-Disciplinary Care Plan Details [name] Date of Service: 12/27/2016 3:30 PM Medical Record Number: [id number] Patient Account Number: [id number] Date of Birth/Sex: 10/25/1945 (70 y.o. Female) Treating RN: Curtis Sites Primary Care Xzaiver Vayda: Milon Score Other Clinician: Referring Kody Brandl: Treating Chemika Nightengale/Extender: Rudene Re in Treatment: 15 Active Inactive Electronic Signature(s) Signed: 12/27/2016 4:38:01 PM By: Curtis Sites Entered By: Curtis Sites on 12/27/2016 15:57:36 [name] ([id number]) -------------------------------------------------------------------------------- Pain Assessment Details [name] Date of Service: 12/27/2016 3:30  PM Medical Record Number: [id number] Patient Account Number: [id number] Date of Birth/Sex: 05-20-1946 (70 y.o. Female) Treating RN: Curtis Sites Primary Care Willowdean Luhmann: Milon Score Other Clinician: Referring Maveric Debono: Treating Zetha Kuhar/Extender: Rudene Re in Treatment: 15 Active Problems Location of Pain Severity and Description of Pain Patient Has Paino No Site Locations Pain Management and Medication Current Pain Management: Notes Topical or injectable lidocaine is offered to patient for acute pain when surgical debridement is performed. If needed, Patient is instructed to use over the counter pain medication for the following 24-48 hours after debridement. Wound care MDs do not prescribed pain medications. Patient has chronic pain or uncontrolled pain. Patient has been instructed to make an appointment with their Primary Care Physician for pain management Electronic Signature(s) Signed: 12/27/2016 4:38:01 PM By: Curtis Sites Entered By: Curtis Sites on 12/27/2016 15:46:52 [name] ([id number]) -------------------------------------------------------------------------------- Patient/Caregiver Education Details [name] Date of Service: 12/27/2016 3:30 PM Medical Record Number: [id number] Patient Account Number: [id number] Date of Birth/Gender: 04/26/1946 (70 y.o. Female) Treating RN: Curtis Sites Primary Care Physician: Milon Score Other Clinician: Referring Physician: Treating Physician/Extender: Rudene Re in Treatment: 15 Education Assessment Education Provided To: Patient Education Topics Provided Venous: Handouts: Other: wear compression hose from morning until bedtime Methods: Explain/Verbal Responses: State content correctly Electronic Signature(s) Signed: 12/27/2016 4:38:01 PM By: Curtis Sites Entered By: Curtis Sites on 12/27/2016 16:00:16 [name]  ([id number]) -------------------------------------------------------------------------------- Wound Assessment Details [name] Date of Service: 12/27/2016 3:30 PM Medical Record Number: [id number] Patient Account Number: [id number] Date of Birth/Sex: 1946-01-05 (70 y.o. Female) Treating RN: Curtis Sites Primary Care Ree Alcalde: Milon Score Other Clinician: Referring Jackalynn Art: Treating Kartier Bennison/Extender: Rudene Re in Treatment: 15 Wound Status Wound Number: 1 Primary Etiology: Trauma, Other Wound Location: Right, Proximal, Anterior Lower Wound Status: Healed - Epithelialized Leg Wounding Event: Trauma Date Acquired: 08/17/2016 Weeks Of Treatment: 15 Clustered Wound: No Photos Photo Uploaded By: Curtis Sites on 12/27/2016 16:36:46 Wound Measurements Length: (cm) 0 % Reducti Width: (cm) 0 % Reducti Depth: (cm) 0 Area: (cm) 0 Volume: (cm) 0 on in Area: 100% on in Volume: 100% Wound Description Full Thickness Without Exposed Classification: Support Structures Periwound Skin Texture Texture Color No Abnormalities Noted: No No Abnormalities Noted: No Moisture No Abnormalities Noted: No Electronic Signature(s) GALIA, RAHM ([id number]) Signed: 12/27/2016 4:38:01 PM By: Curtis Sites Entered By: Curtis Sites on 12/27/2016 15:56:35 [name] ([id number]) -------------------------------------------------------------------------------- Vitals Details [name] Date of Service: 12/27/2016 3:30 PM Medical Record Number: [id number] Patient Account Number: [id number] Date of Birth/Sex: 08/16/1945 (71 y.o. Female) Treating RN: Curtis Sites Primary Care Edin Kon: Milon Score Other Clinician: Referring Maebry Obrien: Treating Geralda Baumgardner/Extender:  Britto, Errol Weeks in Treatment: 15 Vital Signs Time Taken: 15:47 Temperature (F): 97.8 Height (in): 67 Pulse (bpm): 74 Weight (lbs): 197 Respiratory Rate  (breaths/min): 16 Body Mass Index (BMI): 30.9 Blood Pressure (mmHg): 142/59 Reference Range: 80 - 120 mg / dl Electronic Signature(s) Signed: 12/27/2016 4:38:01 PM By: Curtis Sites Entered By: Curtis Sites on 12/27/2016 15:47:12

## 2016-12-29 NOTE — Progress Notes (Signed)
Janet, [name] ([id number]) Visit Report for 12/27/2016 Chief Complaint Document Details [name], [name] Date of Service: 12/27/2016 3:30 PM Medical Record Number: [id number] Patient Account Number: [id number] Date of Birth/Sex: 07/14/1946 (71 y.o. Female) Treating RN: Curtis Sites Primary Care Provider: Milon Score Other Clinician: Referring Provider: Treating Provider/Extender: Rudene Re in Treatment: 15 Information Obtained from: Patient Chief Complaint patient is here for follow-up evaluation of a right lower extremity ulcer Electronic Signature(s) Signed: 12/27/2016 4:09:27 PM By: Evlyn Kanner MD, FACS Entered By: Evlyn Kanner on 12/27/2016 16:09:27 [name] ([id number]) -------------------------------------------------------------------------------- HPI Details [name] Date of Service: 12/27/2016 3:30 PM Medical Record Number: [id number] Patient Account Number: [id number] Date of Birth/Sex: [birth year] (71 y.o. Female) Treating RN: Curtis Sites Primary Care Provider: Milon Score Other Clinician: Referring Provider: Treating Provider/Extender: Rudene Re in Treatment: 15 History of Present Illness Location: right lower extremity in the area of the lateral calf below the knee Quality: Patient reports experiencing a sharp pain to affected area(s) but this is not occurring at this point Severity: Patient states wound are getting better but too slowly for her liking Duration: Patient has had the wound for < 4 weeks prior to presenting for treatment Timing: Pain in wound is intermittent and less frequent these days Context: The wound occurred when the patient a blunt injury and fall Modifying Factors: Other treatment(s) tried include:courses of doxycycline and local Bactroban ointment Associated Signs and Symptoms: Patient reports having increase discharge. HPI Description: 71 year old patient has been referred  to as with right lower extremity wound with edema and ecchymosis, after having a injury due to a fall on 08/20/2016. she has a history of diabetes mellitus type 2 which is uncomplicated, chronic back pain, COPD, hypertension, hypothyroidism, supraventricular tachycardia and tobacco abuse. He is status post neck surgery, appendectomy, cholecystectomy, hernia repair. He smokes about half a pack of cigarettes a day for the last 50 years. Recently on doxycycline for 10 days and was given symptomatic treatment for wound. 10/04/2016 -- the patient tolerated the snap vacuum system well and had no significant problems. She continues to smoke and is trying to give it up. 10/11/2016 -- she continues to tolerate the snap like him system fairly well and other than that she has no fresh issues 11/01/16- she is here for follow-up evaluation of her right lower extremity ulcer. She is tolerating the SNAP negative pressure system. She is voicing no complaints of pain. She states her glucose levels are consistently less than 150 with her evening glucose levels being higher than morning glucose levels. She continues to smoke, approximately 0.5-ppd which is a reduction of 1-ppd approximately 6 months ago. 11/08/16 patient on evaluation today fortunately does not appear to be having as much discomfort as she has in the past. She is tolerating the dressing changes well although she does note some irritation where the wound VAC has been applied. She specifically wonders if we could see about taking a break possibly progressing to discontinuation of the wound VAC. There is no evidence of infection. 12/20/2016 -- we had prepared to use a Grafix skin substitute today but her wound had improved remarkably and we did not use this Electronic Signature(s) Signed: 12/27/2016 4:09:32 PM By: Evlyn Kanner MD, FACS Entered By: Evlyn Kanner on 12/27/2016 16:09:32 [name]  ([id number]) -------------------------------------------------------------------------------- Physical Exam Details [name] Date of Service: 12/27/2016 3:30 PM Medical Record Number: [id number] Patient Account Number: [id number] Date of  Birth/Sex: 09-Jan-1946 (71 y.o. Female) Treating RN: Curtis Sites Primary Care Provider: Milon Score Other Clinician: Referring Provider: Treating Provider/Extender: Rudene Re in Treatment: 15 Constitutional . Pulse regular. Respirations normal and unlabored. Afebrile. . Eyes Nonicteric. Reactive to light. Ears, Nose, Mouth, and Throat Lips, teeth, and gums WNL.Marland Kitchen Moist mucosa without lesions. Neck supple and nontender. No palpable supraclavicular or cervical adenopathy. Normal sized without goiter. Respiratory WNL. No retractions.. Breath sounds WNL, No rubs, rales, rhonchi, or wheeze.. Cardiovascular Heart rhythm and rate regular, no murmur or gallop.. Pedal Pulses WNL. No clubbing, cyanosis or edema. Chest Breasts symmetical and no nipple discharge.. Breast tissue WNL, no masses, lumps, or tenderness.. Lymphatic No adneopathy. No adenopathy. No adenopathy. Musculoskeletal Adexa without tenderness or enlargement.. Digits and nails w/o clubbing, cyanosis, infection, petechiae, ischemia, or inflammatory conditions.. Integumentary (Hair, Skin) No suspicious lesions. No crepitus or fluctuance. No peri-wound warmth or erythema. No masses.Marland Kitchen Psychiatric Judgement and insight Intact.. No evidence of depression, anxiety, or agitation.. Notes the wound is healed. Electronic Signature(s) Signed: 12/27/2016 4:09:48 PM By: Evlyn Kanner MD, FACS Entered By: Evlyn Kanner on 12/27/2016 16:09:48 [name] ([id number]) -------------------------------------------------------------------------------- Physician Orders Details [name] Date of Service: 12/27/2016 3:30 PM Medical Record Number:  [id number] Patient Account Number: [id number] Date of Birth/Sex: [birth year] (71 y.o. Female) Treating RN: Curtis Sites Primary Care Provider: Milon Score Other Clinician: Referring Provider: Treating Provider/Extender: Rudene Re in Treatment: 15 Verbal / Phone Orders: No Diagnosis Coding Discharge From [facility] Services o Discharge from Wound Care Center Electronic Signature(s) Signed: 12/27/2016 4:13:16 PM By: Evlyn Kanner MD, FACS Signed: 12/27/2016 4:38:01 PM By: Curtis Sites Entered By: Curtis Sites on 12/27/2016 15:58:00 [name] ([id number]) -------------------------------------------------------------------------------- Problem List Details [name] Date of Service: 12/27/2016 3:30 PM Medical Record Number: [id number] Patient Account Number: [id number] Date of Birth/Sex: 1946/06/05 (71 y.o. 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 Female) Treating RN: Curtis Sites Primary Care Provider: Milon Score Other Clinician: Referring Provider: Treating Provider/Extender: Rudene Re in Treatment: 15 Subjective Chief Complaint Information obtained from Patient patient is here for follow-up evaluation of a right lower extremity ulcer History of Present Illness (HPI) The following HPI elements were documented for the patient's wound: Location: right lower extremity in the area of the lateral calf below the knee Quality: Patient reports experiencing a sharp pain to affected area(s) but this is not occurring at this point Severity: Patient states wound are getting better but too slowly for her liking Duration: Patient has had the wound for < 4 weeks prior to presenting for treatment Timing: Pain in wound is intermittent and less frequent these days Context: The wound occurred when the patient a blunt injury and fall  Modifying Factors: Other treatment(s) tried include:courses of doxycycline and local Bactroban ointment Associated Signs and Symptoms: Patient reports having increase discharge. 71 year old patient has been referred to as with right lower extremity wound with edema and ecchymosis, after having a injury due to a fall on 08/20/2016. she has a history of diabetes mellitus type 2 which is uncomplicated, chronic back pain, COPD, hypertension, hypothyroidism, supraventricular tachycardia and tobacco abuse. He is status post neck surgery, appendectomy, cholecystectomy, hernia repair. He smokes about half a pack of cigarettes a day for the last 50 years. Recently on doxycycline for 10 days and was given symptomatic treatment for wound. 10/04/2016 -- the patient tolerated the snap vacuum system well and had no significant problems. She continues to smoke and is trying to give it up. 10/11/2016 -- she continues to tolerate the snap like him system fairly well and other than that she has no fresh  issues 11/01/16- she is here for follow-up evaluation of her right lower extremity ulcer. She is tolerating the SNAP negative pressure system. She is voicing no complaints of pain. She states her glucose levels are consistently less than 150 with her evening glucose levels being higher than morning glucose levels. She continues to smoke, approximately 0.5-ppd which is a reduction of 1-ppd approximately 6 months ago. 11/08/16 patient on evaluation today fortunately does not appear to be having as much discomfort as she has in the past. She is tolerating the dressing changes well although she does note some irritation where the wound VAC has been applied. She specifically wonders if we could see about taking a break possibly progressing to discontinuation of the wound VAC. There is no evidence of infection. 12/20/2016 -- we had prepared to use a Grafix skin substitute today but her wound had improved [place], Noeli C. ([id number]) remarkably and we did not use this Objective Constitutional Pulse regular. Respirations normal and unlabored. Afebrile. Vitals Time Taken: 3:47 PM, Height: 67 in, Weight: 197 lbs, BMI: 30.9, Temperature: 97.8 F, Pulse: 74 bpm, Respiratory Rate: 16 breaths/min, Blood Pressure: 142/59 mmHg. Eyes Nonicteric. Reactive to light. Ears, Nose, Mouth, and Throat Lips, teeth, and gums WNL.Marland Kitchen Moist mucosa without lesions. Neck supple and nontender. No palpable supraclavicular or cervical adenopathy. Normal sized without goiter. Respiratory WNL. No retractions.. Breath sounds WNL, No rubs, rales, rhonchi, or wheeze.. Cardiovascular Heart rhythm and rate regular, no murmur or gallop.. Pedal Pulses WNL. No clubbing, cyanosis or edema. Chest Breasts symmetical and no nipple discharge.. Breast tissue WNL, no masses, lumps, or tenderness.. Lymphatic No adneopathy. No adenopathy. No adenopathy. Musculoskeletal Adexa without tenderness or enlargement.. Digits and nails w/o  clubbing, cyanosis, infection, petechiae, ischemia, or inflammatory conditions.Marland Kitchen Psychiatric Judgement and insight Intact.. No evidence of depression, anxiety, or agitation.. General Notes: the wound is healed. Integumentary (Hair, Skin) No suspicious lesions. No crepitus or fluctuance. No peri-wound warmth or erythema. No masses.Marland Kitchen [name], [name] ([id number]) Wound #1 status is Healed - Epithelialized. Original cause of wound was Trauma. The wound is located on the Right,Proximal,Anterior Lower Leg. The wound measures 0cm length x 0cm width x 0cm depth; 0cm^2 area and 0cm^3 volume. Assessment Active Problems ICD-10 E11.622 - Type 2 diabetes mellitus with other skin ulcer M70.861 - Other soft tissue disorders related to use, overuse and pressure, right lower leg L97.212 - Non-pressure chronic ulcer of right calf with fat layer exposed F17.218 - Nicotine dependence, cigarettes, with other nicotine-induced disorders Plan Discharge From [facility] Services: Discharge from Wound Care Center The wound is healed  and I have asked to be very protective of the supple scar. I have recommended foam with some Kerlix to be placed and to wear 20-30 mm depression stockings which she has. She is discharged from the wound care services and will be asked to come back to see as if there is any question or concern Electronic Signature(s) Signed: 12/27/2016 4:10:50 PM By: Evlyn KannerBritto, Deija Buhrman MD, FACS Entered By: Evlyn KannerBritto, Abel Hageman on 12/27/2016 16:10:50 [name], [name] ([id number]) -------------------------------------------------------------------------------- SuperBill Details [name], [name] Date of Service: 12/27/2016 Medical Record Number: [id number] Patient Account Number: [id number] Date of Birth/Sex: 20-Jun-1946 (71 y.o. Female) Treating RN: Curtis Sitesorthy, Joanna Primary Care Provider: Milon ScoreJONES, CARON Other Clinician: Referring Provider: Treating Provider/Extender: Rudene ReBritto, Bernadette Armijo Weeks in Treatment:  15 Diagnosis Coding ICD-10 Codes Code Description E11.622 Type 2 diabetes mellitus with other skin ulcer M70.861 Other soft tissue disorders related to use, overuse and pressure, right lower leg L97.212 Non-pressure chronic ulcer of right calf with fat layer exposed F17.218 Nicotine dependence, cigarettes, with other nicotine-induced disorders Facility Procedures CPT4 Code: [id number] Description: [phone] - WOUND CARE VISIT-LEV 2 EST PT Modifier: Quantity: 1 Physician Procedures CPT4: Description Modifier Quantity Code [id number] - WC PHYS LEVEL 2 - EST PT 1 ICD-10 Description Diagnosis E11.622 Type 2 diabetes mellitus with other skin ulcer M70.861 Other soft tissue disorders related to use, overuse and pressure, right lower  leg L97.212 Non-pressure chronic ulcer of right calf with fat layer exposed F17.218 Nicotine dependence, cigarettes, with other nicotine-induced disorders Electronic Signature(s) Signed: 12/27/2016 4:11:05 PM By: Evlyn KannerBritto, Orvell Careaga MD, FACS Entered By: Evlyn KannerBritto, Rivers Gassmann on 12/27/2016 16:11:05

## 2016-12-31 ENCOUNTER — Ambulatory Visit
Admission: RE | Admit: 2016-12-31 | Discharge: 2016-12-31 | Disposition: A | Discharge status: Home / Self Care | Payer: Medicare Other | Source: Ambulatory Visit | Attending: Specialist | Admitting: Specialist

## 2016-12-31 DIAGNOSIS — I700 Atherosclerosis of aorta: Secondary | ICD-10-CM | POA: Diagnosis not present

## 2016-12-31 DIAGNOSIS — R918 Other nonspecific abnormal finding of lung field: Secondary | ICD-10-CM | POA: Diagnosis present

## 2016-12-31 DIAGNOSIS — J439 Emphysema, unspecified: Secondary | ICD-10-CM | POA: Diagnosis not present

## 2017-02-20 ENCOUNTER — Encounter: Payer: Medicare Other | Attending: Physician Assistant | Admitting: Physician Assistant

## 2017-02-20 ENCOUNTER — Other Ambulatory Visit
Admission: RE | Admit: 2017-02-20 | Discharge: 2017-02-20 | Disposition: A | Discharge status: Home / Self Care | Payer: Medicare Other | Source: Ambulatory Visit | Attending: Physician Assistant | Admitting: Physician Assistant

## 2017-02-20 DIAGNOSIS — I471 Supraventricular tachycardia: Secondary | ICD-10-CM | POA: Insufficient documentation

## 2017-02-20 DIAGNOSIS — L97212 Non-pressure chronic ulcer of right calf with fat layer exposed: Secondary | ICD-10-CM | POA: Insufficient documentation

## 2017-02-20 DIAGNOSIS — I10 Essential (primary) hypertension: Secondary | ICD-10-CM | POA: Diagnosis not present

## 2017-02-20 DIAGNOSIS — M549 Dorsalgia, unspecified: Secondary | ICD-10-CM | POA: Insufficient documentation

## 2017-02-20 DIAGNOSIS — J449 Chronic obstructive pulmonary disease, unspecified: Secondary | ICD-10-CM | POA: Insufficient documentation

## 2017-02-20 DIAGNOSIS — G8929 Other chronic pain: Secondary | ICD-10-CM | POA: Diagnosis not present

## 2017-02-20 DIAGNOSIS — F17218 Nicotine dependence, cigarettes, with other nicotine-induced disorders: Secondary | ICD-10-CM | POA: Diagnosis not present

## 2017-02-20 DIAGNOSIS — E039 Hypothyroidism, unspecified: Secondary | ICD-10-CM | POA: Insufficient documentation

## 2017-02-20 DIAGNOSIS — E11622 Type 2 diabetes mellitus with other skin ulcer: Secondary | ICD-10-CM | POA: Insufficient documentation

## 2017-02-20 DIAGNOSIS — Z119 Encounter for screening for infectious and parasitic diseases, unspecified: Secondary | ICD-10-CM | POA: Insufficient documentation

## 2017-02-22 LAB — AEROBIC CULTURE W GRAM STAIN (SUPERFICIAL SPECIMEN)

## 2017-02-22 LAB — AEROBIC CULTURE  (SUPERFICIAL SPECIMEN): CULTURE: NO GROWTH

## 2017-02-22 NOTE — Progress Notes (Signed)
DAEJAH, KLEBBA ([id number]) Visit Report for 02/20/2017 Chief Complaint Document Details [name], [name] Date of Service: 02/20/2017 10:30 AM Medical Record Number: [id number] Patient Account Number: [id number] Date of Birth/Sex: 1945-08-06 (70 y.o. Female) Treating RN: Ashok Cordia, Debi Primary Care Provider: Milon Score Other Clinician: Referring Provider: Referral, Self Treating Provider/Extender: Linwood Dibbles, HOYT Weeks in Treatment: 0 Information Obtained from: Patient Chief Complaint patient is here for follow-up evaluation of a right lower extremity ulcer Electronic Signature(s) Signed: 02/21/2017 10:18:42 AM By: Lenda Kelp PA-C Entered By: Lenda Kelp on 02/20/2017 11:13:07 [name] ([id number]) -------------------------------------------------------------------------------- HPI Details [name] Date of Service: 02/20/2017 10:30 AM Medical Record Number: [id number] Patient Account Number: [id number] Date of Birth/Sex: 1946/01/03 (70 y.o. Female) Treating RN: Ashok Cordia, Debi Primary Care Provider: Milon Score Other Clinician: Referring Provider: Referral, Self Treating Provider/Extender: STONE III, HOYT Weeks in Treatment: 0 History of Present Illness Location: right lower extremity in the area of the lateral calf below the knee Quality: She is having no pain Context: The wound occurred when the patient a blunt injury and fall injury Modifying Factors: None as patient was healed Associated Signs and Symptoms: Patient reports having increase discharge. HPI Description: 72 year old patient has been referred to as with right lower extremity wound with edema and ecchymosis, after having a injury due to a fall on 08/20/2016. she has a history of diabetes mellitus type 2 which is uncomplicated, chronic back pain, COPD, hypertension, hypothyroidism, supraventricular tachycardia and tobacco abuse. He is status post neck surgery,  appendectomy, cholecystectomy, hernia repair. He smokes about half a pack of cigarettes a day for the last 50 years. Recently on doxycycline for 10 days and was given symptomatic treatment for wound. 10/04/2016 -- the patient tolerated the snap vacuum system well and had no significant problems. She continues to smoke and is trying to give it up. 10/11/2016 -- she continues to tolerate the snap like him system fairly well and other than that she has no fresh issues 11/01/16- she is here for follow-up evaluation of her right lower extremity ulcer. She is tolerating the SNAP negative pressure system. She is voicing no complaints of pain. She states her glucose levels are consistently less than 150 with her evening glucose levels being higher than morning glucose levels. She continues to smoke, approximately 0.5-ppd which is a reduction of 1-ppd approximately 6 months ago. 11/08/16 patient on evaluation today fortunately does not appear to be having as much discomfort as she has in the past. She is tolerating the dressing changes well although she does note some irritation where the wound VAC has been applied. She specifically wonders if we could see about taking a break possibly progressing to discontinuation of the wound VAC. There is no evidence of infection. 12/20/2016 -- we had prepared to use a Grafix skin substitute today but her wound had improved remarkably and we did not use this Readmission: 02/20/17 on evaluation today patient's wound which had previously healed in June 2018 unfortunately had two areas that began to drain in the past week. She therefore called to be reevaluated in regard to this wound. At the location of the healed wound server she has two panel locations one at 9 o'clock and one at 6 o'clock it appeared to be draining fluid and it seems that she did not fully heal underneath the wound as this close. This therefore trapped fluid and now has reopened. Fortunately it seems  to be mild and  I think this will likely close and heal very quickly although obviously this is frustrating for the patient and she thought that she Rathbun, [name] ([id number]) was done with this wound. No fevers, chills, nausea, or vomiting noted at this time. Patient is having no pain. Electronic Signature(s) Signed: 02/21/2017 10:18:42 AM By: Lenda Kelp PA-C Entered By: Lenda Kelp on 02/20/2017 11:18:36 [name] ([id number]) -------------------------------------------------------------------------------- Physical Exam Details [name] Date of Service: 02/20/2017 10:30 AM Medical Record Number: [id number] Patient Account Number: [id number] Date of Birth/Sex: 1945-08-17 (70 y.o. Female) Treating RN: Ashok Cordia, Debi Primary Care Provider: Milon Score Other Clinician: Referring Provider: Referral, Self Treating Provider/Extender: STONE III, HOYT Weeks in Treatment: 0 Constitutional sitting or standing blood pressure is within target range for patient.. pulse regular and within target range for patient.Marland Kitchen respirations regular, non-labored and within target range for patient.Marland Kitchen temperature within target range for patient.. Well-nourished and well-hydrated in no acute distress. Eyes conjunctiva clear no eyelid edema noted. pupils equal round and reactive to light and accommodation. Ears, Nose, Mouth, and Throat no gross abnormality of ear auricles or external auditory canals. normal hearing noted during conversation. mucus membranes moist. Respiratory normal breathing without difficulty. clear to auscultation bilaterally. Cardiovascular regular rate and rhythm with normal S1, S2. no clubbing, cyanosis, significant edema, <3 sec cap refill. Musculoskeletal normal gait and posture. no significant deformity or arthritic changes, no loss or range of motion, no clubbing. Psychiatric this patient is able to make decisions and demonstrates good insight into  disease process. Alert and Oriented x 3. pleasant and cooperative. Notes Visualizing the panel areas as best I can it looks as if she has good granulation tissue and overall I feel like that these were just small aromas that were collecting underneath the edge of the wound where it was healing. Nonetheless she did have some purulent drainage noted from the 6 o'clock location which was sent for culture today just to be sure. Patient has no surrounding erythema noted. Electronic Signature(s) Signed: 02/21/2017 10:18:42 AM By: Lenda Kelp PA-C Entered By: Lenda Kelp on 02/20/2017 11:19:51 [name], [name] ([id number]) -------------------------------------------------------------------------------- Physician Orders Details [name] Date of Service: 02/20/2017 10:30 AM Medical Record Number: [id number] Patient Account Number: [id number] Date of Birth/Sex: April 25, 1946 (70 y.o. Female) Treating RN: Ashok Cordia, Debi Primary Care Provider: Milon Score Other Clinician: Referring Provider: Referral, Self Treating Provider/Extender: Linwood Dibbles, HOYT Weeks in Treatment: 0 Verbal / Phone Orders: Yes Clinician: Ashok Cordia, Debi Read Back and Verified: Yes Diagnosis Coding ICD-10 Coding Code Description E11.622 Type 2 diabetes mellitus with other skin ulcer L97.212 Non-pressure chronic ulcer of right calf with fat layer exposed F17.218 Nicotine dependence, cigarettes, with other nicotine-induced disorders Wound Cleansing Wound #2 Right,Distal,Anterior Lower Leg o Clean wound with Normal Saline. o Cleanse wound with mild soap and water Wound #3 Right,Proximal,Anterior Lower Leg o Clean wound with Normal Saline. o Cleanse wound with mild soap and water Anesthetic Wound #2 Right,Distal,Anterior Lower Leg o Topical Lidocaine 4% cream applied to wound bed prior to debridement Wound #3 Right,Proximal,Anterior Lower Leg o Topical Lidocaine 4% cream applied to  wound bed prior to debridement Skin Barriers/Peri-Wound Care Wound #2 Right,Distal,Anterior Lower Leg o Skin Prep Wound #3 Right,Proximal,Anterior Lower Leg o Skin Prep Primary Wound Dressing Wound #2 Right,Distal,Anterior Lower Leg o Iodoform packing Gauze Wound #3 Right,Proximal,Anterior Lower Leg o Iodoform packing Gauze Rotondo, [name] ([id number]) Secondary Dressing Wound #2 Right,Distal,Anterior Lower Leg o Dry Gauze   o Non-adherent pad - telfa island Wound #3 Right,Proximal,Anterior Lower Leg o Dry Gauze o Non-adherent pad - telfa island Dressing Change Frequency Wound #2 Right,Distal,Anterior Lower Leg o Change dressing every day. Wound #3 Right,Proximal,Anterior Lower Leg o Change dressing every day. Follow-up Appointments Wound #2 Right,Distal,Anterior Lower Leg o Return Appointment in 1 week. Wound #3 Right,Proximal,Anterior Lower Leg o Return Appointment in 1 week. Edema Control Wound #2 Right,Distal,Anterior Lower Leg o Support Garment 10-20 mm/Hg pressure to: Wound #3 Right,Proximal,Anterior Lower Leg o Support Garment 10-20 mm/Hg pressure to: Additional Orders / Instructions Wound #2 Right,Distal,Anterior Lower Leg o Increase protein intake. Wound #3 Right,Proximal,Anterior Lower Leg o Increase protein intake. Laboratory o Bacteria identified in Wound by Culture (MICRO) - (ICD10 L97.212 - Non-pressure chronic ulcer of right calf with fat layer exposed) oooo LOINC Code: 6462-6 oooo Convenience Name: Wound culture routine Weitman, Mone C. ([id number]) Notes I am going to initiate Iodoform Gauls packing into the two tunnel locations at 9 o'clock and 6 o'clock. We will cut the quarter inch strips in half and patient was advised to lightly pack the wounds. She states she will likely have her son do this and so we will feature what to show her son to do as well. Otherwise we will send the culture and see what that shows as well  and if anything worsens in the interim she will contact our office for additional recommendations. We will see her in one week. Electronic Signature(s) Signed: 02/21/2017 10:18:42 AM By: Lenda Kelp PA-C Entered By: Lenda Kelp on 02/20/2017 11:20:54 ARDELIA, WREDE ([id number]) -------------------------------------------------------------------------------- Problem List Details [name] Date of Service: 02/20/2017 10:30 AM Medical Record Number: [id number] Patient Account Number: [id number] Date of Birth/Sex: 1946/05/08 (70 y.o. Female) Treating RN: Ashok Cordia, Debi Primary Care Provider: Milon Score Other Clinician: Referring Provider: Referral, Self Treating Provider/Extender: Linwood Dibbles, HOYT Weeks in Treatment: 0 Active Problems ICD-10 Encounter Code Description Active Date Diagnosis E11.622 Type 2 diabetes mellitus with other skin ulcer 02/20/2017 Yes L97.212 Non-pressure chronic ulcer of right calf with fat layer 02/20/2017 Yes exposed F17.218 Nicotine dependence, cigarettes, with other nicotine- 02/20/2017 Yes induced disorders Inactive Problems Resolved Problems Electronic Signature(s) Signed: 02/21/2017 10:18:42 AM By: Lenda Kelp PA-C Entered By: Lenda Kelp on 02/20/2017 10:59:16 [name] ([id number]) -------------------------------------------------------------------------------- Progress Note Details [name] Date of Service: 02/20/2017 10:30 AM Medical Record Number: [id number] Patient Account Number: [id number] Date of Birth/Sex: Oct 08, 1945 (70 y.o. Female) Treating RN: Ashok Cordia, Debi Primary Care Provider: Milon Score Other Clinician: Referring Provider: Referral, Self Treating Provider/Extender: Linwood Dibbles, HOYT Weeks in Treatment: 0 Subjective Chief Complaint Information obtained from Patient patient is here for follow-up evaluation of a right lower extremity ulcer History of Present Illness  (HPI) The following HPI elements were documented for the patient's wound: Location: right lower extremity in the area of the lateral calf below the knee Quality: She is having no pain Context: The wound occurred when the patient a blunt injury and fall injury Modifying Factors: None as patient was healed Associated Signs and Symptoms: Patient reports having increase discharge. 71 year old patient has been referred to as with right lower extremity wound with edema and ecchymosis, after having a injury due to a fall on 08/20/2016. she has a history of diabetes mellitus type 2 which is uncomplicated, chronic back pain, COPD, hypertension, hypothyroidism, supraventricular tachycardia and tobacco abuse. He is status post neck surgery, appendectomy, cholecystectomy, hernia repair. He smokes about  half a pack of cigarettes a day for the last 50 years. Recently on doxycycline for 10 days and was given symptomatic treatment for wound. 10/04/2016 -- the patient tolerated the snap vacuum system well and had no significant problems. She continues to smoke and is trying to give it up. 10/11/2016 -- she continues to tolerate the snap like him system fairly well and other than that she has no fresh issues 11/01/16- she is here for follow-up evaluation of her right lower extremity ulcer. She is tolerating the SNAP negative pressure system. She is voicing no complaints of pain. She states her glucose levels are consistently less than 150 with her evening glucose levels being higher than morning glucose levels. She continues to smoke, approximately 0.5-ppd which is a reduction of 1-ppd approximately 6 months ago. 11/08/16 patient on evaluation today fortunately does not appear to be having as much discomfort as she has in the past. She is tolerating the dressing changes well although she does note some irritation where the wound VAC has been applied. She specifically wonders if we could see about taking a break  possibly progressing to discontinuation of the wound VAC. There is no evidence of infection. 12/20/2016 -- we had prepared to use a Grafix skin substitute today but her wound had improved remarkably and we did not use this Readmission: [name], [name] ([id number]) 02/20/17 on evaluation today patient's wound which had previously healed in June 2018 unfortunately had two areas that began to drain in the past week. She therefore called to be reevaluated in regard to this wound. At the location of the healed wound server she has two panel locations one at 9 o'clock and one at 6 o'clock it appeared to be draining fluid and it seems that she did not fully heal underneath the wound as this close. This therefore trapped fluid and now has reopened. Fortunately it seems to be mild and I think this will likely close and heal very quickly although obviously this is frustrating for the patient and she thought that she was done with this wound. No fevers, chills, nausea, or vomiting noted at this time. Patient is having no pain. Wound History Patient presents with 1 open wound that has been present for approximately a week. Patient has been treating wound in the following manner: nothing. The wound has been healed in the past but has re-opened. Laboratory tests have not been performed in the last month. Patient reportedly has not tested positive for an antibiotic resistant organism. Patient reportedly has not tested positive for osteomyelitis. Patient reportedly has not had testing performed to evaluate circulation in the legs. Patient experiences the following problems associated with their wounds: swelling. Patient History Information obtained from Patient. Allergies tramadol, erythromycin estolate Social History Former smoker, Marital Status - Married, Alcohol Use - Never, Drug Use - No History, Caffeine Use - Daily. Medical And Surgical History Notes Cardiovascular hx SVT - followed by Dr  Lady Gary Objective Constitutional sitting or standing blood pressure is within target range for patient.. pulse regular and within target range for patient.Marland Kitchen respirations regular, non-labored and within target range for patient.Marland Kitchen temperature within target range for patient.. Well-nourished and well-hydrated in no acute distress. AVRYL, ROEHM ([id number]) Vitals Time Taken: 10:39 AM, Height: 67 in, Source: Stated, Weight: 202 lbs, Source: Measured, BMI: 31.6, Temperature: 98.2 F, Pulse: 71 bpm, Respiratory Rate: 20 breaths/min, Blood Pressure: 139/66 mmHg. Eyes conjunctiva clear no eyelid edema noted. pupils equal round and reactive to light and accommodation. Ears,  Nose, Mouth, and Throat no gross abnormality of ear auricles or external auditory canals. normal hearing noted during conversation. mucus membranes moist. Respiratory normal breathing without difficulty. clear to auscultation bilaterally. Cardiovascular regular rate and rhythm with normal S1, S2. no clubbing, cyanosis, significant edema, Musculoskeletal normal gait and posture. no significant deformity or arthritic changes, no loss or range of motion, no clubbing. Psychiatric this patient is able to make decisions and demonstrates good insight into disease process. Alert and Oriented x 3. pleasant and cooperative. General Notes: Visualizing the panel areas as best I can it looks as if she has good granulation tissue and overall I feel like that these were just small aromas that were collecting underneath the edge of the wound where it was healing. Nonetheless she did have some purulent drainage noted from the 6 o'clock location which was sent for culture today just to be sure. Patient has no surrounding erythema noted. Integumentary (Hair, Skin) Wound #2 status is Open. Original cause of wound was Gradually Appeared. The wound is located on the Right,Distal,Anterior Lower Leg. The wound measures 0.2cm length x 0.1cm width x  0.2cm depth; 0.016cm^2 area and 0.003cm^3 volume. There is tunneling at 6:00 with a maximum distance of 1cm. There is a large amount of purulent drainage noted. The wound margin is distinct with the outline attached to the wound base. There is large (67-100%) granulation within the wound bed. There is no necrotic tissue within the wound bed. Periwound temperature was noted as No Abnormality. The periwound has tenderness on palpation. Wound #3 status is Open. Original cause of wound was Gradually Appeared. The wound is located on the Right,Proximal,Anterior Lower Leg. The wound measures 0.2cm length x 0.1cm width x 0.1cm depth; 0.016cm^2 area and 0.002cm^3 volume. There is no undermining noted, however, there is tunneling at 9:00 with a maximum distance of 0.9cm. There is a large amount of purulent drainage noted. The wound margin is distinct with the outline attached to the wound base. There is large (67-100%) red granulation within the wound bed. There is no necrotic tissue within the wound bed. Periwound temperature was noted as No Abnormality. The periwound has tenderness on palpation. Assessment [name], [name] ([id number]) Active Problems ICD-10 E11.622 - Type 2 diabetes mellitus with other skin ulcer L97.212 - Non-pressure chronic ulcer of right calf with fat layer exposed F17.218 - Nicotine dependence, cigarettes, with other nicotine-induced disorders Plan Wound Cleansing: Wound #2 Right,Distal,Anterior Lower Leg: Clean wound with Normal Saline. Cleanse wound with mild soap and water Wound #3 Right,Proximal,Anterior Lower Leg: Clean wound with Normal Saline. Cleanse wound with mild soap and water Anesthetic: Wound #2 Right,Distal,Anterior Lower Leg: Topical Lidocaine 4% cream applied to wound bed prior to debridement Wound #3 Right,Proximal,Anterior Lower Leg: Topical Lidocaine 4% cream applied to wound bed prior to debridement Skin Barriers/Peri-Wound Care: Wound #2  Right,Distal,Anterior Lower Leg: Skin Prep Wound #3 Right,Proximal,Anterior Lower Leg: Skin Prep Primary Wound Dressing: Wound #2 Right,Distal,Anterior Lower Leg: Iodoform packing Gauze Wound #3 Right,Proximal,Anterior Lower Leg: Iodoform packing Gauze Secondary Dressing: Wound #2 Right,Distal,Anterior Lower Leg: Dry Gauze Non-adherent pad - telfa island Wound #3 Right,Proximal,Anterior Lower Leg: Dry Gauze Non-adherent pad - telfa island Dressing Change Frequency: Wound #2 Right,Distal,Anterior Lower Leg: Change dressing every day. Wound #3 Right,Proximal,Anterior Lower Leg: Change dressing every day. Follow-up Appointments: JOZLIN, BENTLY ([id number]) Wound #2 Right,Distal,Anterior Lower Leg: Return Appointment in 1 week. Wound #3 Right,Proximal,Anterior Lower Leg: Return Appointment in 1 week. Edema Control: Wound #2 Right,Distal,Anterior Lower Leg: Support Garment  10-20 mm/Hg pressure to: Wound #3 Right,Proximal,Anterior Lower Leg: Support Garment 10-20 mm/Hg pressure to: Additional Orders / Instructions: Wound #2 Right,Distal,Anterior Lower Leg: Increase protein intake. Wound #3 Right,Proximal,Anterior Lower Leg: Increase protein intake. Laboratory ordered were: Wound culture routine General Notes: I am going to initiate Iodoform Gauls packing into the two tunnel locations at 9 o'clock and 6 o'clock. We will cut the quarter inch strips in half and patient was advised to lightly pack the wounds. She states she will likely have her son do this and so we will feature what to show her son to do as well. Otherwise we will send the culture and see what that shows as well and if anything worsens in the interim she will contact our office for additional recommendations. We will see her in one week. Electronic Signature(s) Signed: 02/21/2017 10:18:42 AM By: Lenda Kelp PA-C Entered By: Lenda Kelp on 02/20/2017 11:21:04 [name], [name]  ([id number]) -------------------------------------------------------------------------------- ROS/PFSH Details [name] Date of Service: 02/20/2017 10:30 AM Medical Record Number: [id number] Patient Account Number: [id number] Date of Birth/Sex: October 14, 1945 (70 y.o. Female) Treating RN: Ashok Cordia, Debi Primary Care Provider: Milon Score Other Clinician: Referring Provider: Referral, Self Treating Provider/Extender: STONE III, HOYT Weeks in Treatment: 0 Information Obtained From Patient Wound History Do you currently have one or more open woundso Yes How many open wounds do you currently haveo 1 Approximately how long have you had your woundso a week How have you been treating your wound(s) until nowo nothing Has your wound(s) ever healed and then re-openedo Yes Have you had any lab work done in the past montho No Have you tested positive for an antibiotic resistant organism (MRSA, VRE)o No Have you tested positive for osteomyelitis (bone infection)o No Have you had any tests for circulation on your legso No Have you had other problems associated with your woundso Swelling Eyes Medical History: Negative for: Cataracts; Glaucoma; Optic Neuritis Ear/Nose/Mouth/Throat Medical History: Negative for: Chronic sinus problems/congestion; Middle ear problems Hematologic/Lymphatic Medical History: Negative for: Anemia; Hemophilia; Human Immunodeficiency Virus; Lymphedema; Sickle Cell Disease Respiratory Medical History: Positive for: Chronic Obstructive Pulmonary Disease (COPD) Negative for: Aspiration; Asthma; Pneumothorax; Sleep Apnea; Tuberculosis Cardiovascular Medical History: Positive for: Hypertension Negative for: Angina; Arrhythmia; Congestive Heart Failure; Coronary Artery Disease; Deep Vein Thrombosis; Hypotension; Myocardial Infarction; Peripheral Arterial Disease; Peripheral Venous Disease; Phlebitis; Vasculitis Limpert, [name] ([id number]) Past Medical  History Notes: hx SVT - followed by Dr Lady Gary Gastrointestinal Medical History: Negative for: Cirrhosis ; Colitis; Crohnos; Hepatitis A; Hepatitis B; Hepatitis C Endocrine Medical History: Positive for: Type II Diabetes Negative for: Type I Diabetes Time with diabetes: 10 years Treated with: Oral agents Immunological Medical History: Negative for: Lupus Erythematosus; Raynaudos; Scleroderma Integumentary (Skin) Medical History: Negative for: History of Burn; History of pressure wounds Musculoskeletal Medical History: Negative for: Gout; Rheumatoid Arthritis; Osteoarthritis; Osteomyelitis Neurologic Medical History: Negative for: Dementia; Neuropathy; Quadriplegia; Paraplegia; Seizure Disorder Oncologic Medical History: Negative for: Received Chemotherapy; Received Radiation Immunizations Pneumococcal Vaccine: Received Pneumococcal Vaccination: Yes Immunization Notes: up to date Family and Social History [name], [name] ([id number]) Former smoker; Marital Status - Married; Alcohol Use: Never; Drug Use: No History; Caffeine Use: Daily; Financial Concerns: No; Food, Clothing or Shelter Needs: No; Support System Lacking: No; Transportation Concerns: No; Advanced Directives: No; Patient does not want information on Advanced Directives Electronic Signature(s) Signed: 02/20/2017 4:59:12 PM By: Alejandro Mulling Signed: 02/21/2017 10:18:42 AM By: Lenda Kelp PA-C Entered By: Alejandro Mulling on 02/20/2017 10:45:09 [name],  Janet SanesMARY C. ([id number]) -------------------------------------------------------------------------------- SuperBill Details Patient Name: Janet SimpersLANGLEY, [name] Date of Service: 02/20/2017 Medical Record Number: [id number] Patient Account Number: [id number] Date of Birth/Sex: Sep 07, 1945 (70 y.o. 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 F17.218 Nicotine dependence, cigarettes, with other nicotine-induced disorders Facility Procedures CPT4 Code: [id number] Description: 99214 - WOUND CARE VISIT-LEV 4 EST PT Modifier: Quantity: 1 Physician Procedures CPT4 Code Description: 87564336770424 99214 - WC PHYS LEVEL 4 - EST PT ICD-10 Description Diagnosis E11.622 Type 2 diabetes mellitus with other skin ulcer L97.212 Non-pressure chronic ulcer of right calf with fat l F17.218 Nicotine dependence, cigarettes, with  other nicotin Modifier: ayer exposed e-induced di Quantity: 1 sorders Electronic Signature(s) Signed: 02/20/2017 4:59:12 PM By: Alejandro MullingPinkerton, Debra Signed: 02/21/2017 10:18:42 AM By: Lenda KelpStone III, Hoyt PA-C Entered By: Alejandro MullingPinkerton, Debra on 02/20/2017 11:58:30

## 2017-02-22 NOTE — Progress Notes (Signed)
[name], Catelynn C. ([id number]) Visit Report for 02/20/2017 Abuse/Suicide Risk Screen Details [name], [name] Date of Service: 02/20/2017 10:30 AM Medical Record Number: [id number] Patient Account Number: [id number] Date of Birth/Sex: Aug 31, 1945 (70 y.o. Female) Treating RN: Ashok CordiaPinkerton, Debi Primary Care Deaja Rizo: Milon ScoreJONES, CARON Other Clinician: Referring Daeshon Grammatico: Referral, Self Treating Tahja Liao/Extender: STONE III, HOYT Weeks in Treatment: 0 Abuse/Suicide Risk Screen Items Answer ABUSE/SUICIDE RISK SCREEN: Has anyone close to you tried to hurt or harm you recentlyo No Do you feel uncomfortable with anyone in your familyo No Has anyone forced you do things that you didnot want to doo No Do you have any thoughts of harming yourselfo No Patient displays signs or symptoms of abuse and/or neglect. No Electronic Signature(s) Signed: 02/20/2017 4:59:12 PM By: Alejandro MullingPinkerton, Debra Entered By: Alejandro MullingPinkerton, Debra on 02/20/2017 10:45:22 [name], [name] ([id number]) -------------------------------------------------------------------------------- Activities of Daily Living Details [name], [name] Date of Service: 02/20/2017 10:30 AM Medical Record Number: [id number] Patient Account Number: [id number] Date of Birth/Sex: Aug 31, 1945 (70 y.o. Female) Treating RN: Ashok CordiaPinkerton, Debi Primary Care Jamita Mckelvin: Milon ScoreJONES, CARON Other Clinician: Referring Eldora Napp: Referral, Self Treating Aneisha Skyles/Extender: STONE III, HOYT Weeks in Treatment: 0 Activities of Daily Living Items Answer Activities of Daily Living (Please select one for each item) Drive Automobile Completely Able Take Medications Completely Able Use Telephone Completely Able Care for Appearance Completely Able Use Toilet Completely Able Bath / Shower Completely Able Dress Self Completely Able Feed Self Completely Able Walk Completely Able Get In / Out Bed Completely Able Housework Completely Able Prepare Meals Completely  Able Handle Money Completely Able Shop for Self Completely Able Electronic Signature(s) Signed: 02/20/2017 4:59:12 PM By: Alejandro MullingPinkerton, Debra Entered By: Alejandro MullingPinkerton, Debra on 02/20/2017 10:45:38 [name], [name] ([id number]) -------------------------------------------------------------------------------- Education Assessment Details [name], [name] Date of Service: 02/20/2017 10:30 AM Medical Record Number: [id number] Patient Account Number: [id number] Date of Birth/Sex: Aug 31, 1945 (70 y.o. Female) Treating RN: Ashok CordiaPinkerton, Debi Primary Care Mahima Hottle: Milon ScoreJONES, CARON Other Clinician: Referring Monet North: Referral, Self Treating Eural Holzschuh/Extender: Linwood DibblesSTONE III, HOYT Weeks in Treatment: 0 Primary Learner Assessed: Patient Learning Preferences/Education Level/Primary Language Learning Preference: Explanation, Printed Material Highest Education Level: High School Preferred Language: English Cognitive Barrier Assessment/Beliefs Language Barrier: No Translator Needed: No Memory Deficit: No Emotional Barrier: No Cultural/Religious Beliefs Affecting Medical No Care: Physical Barrier Assessment Impaired Vision: Yes Glasses Impaired Hearing: No Decreased Hand dexterity: No Knowledge/Comprehension Assessment Knowledge Level: Medium Comprehension Level: Medium Ability to understand written Medium instructions: Ability to understand verbal Medium instructions: Motivation Assessment Anxiety Level: Calm Cooperation: Cooperative Education Importance: Acknowledges Need Interest in Health Problems: Asks Questions Perception: Coherent Willingness to Engage in Self- Medium Management Activities: Readiness to Engage in Self- Medium Management Activities: Electronic Signature(s) [name], Kelissa C. ([id number]) Signed: 02/20/2017 4:59:12 PM By: Alejandro MullingPinkerton, Debra Entered By: Alejandro MullingPinkerton, Debra on 02/20/2017 10:46:01 [name], [name]  ([id number]) -------------------------------------------------------------------------------- Fall Risk Assessment Details [name], [name] Date of Service: 02/20/2017 10:30 AM Medical Record Number: [id number] Patient Account Number: [id number] Date of Birth/Sex: Aug 31, 1945 [age] y.o. Female) Treating RN: Ashok CordiaPinkerton, Debi Primary Care Sabas Frett: Milon ScoreJONES, CARON Other Clinician: Referring Simar Pothier: Referral, Self Treating Iyauna Sing/Extender: Linwood DibblesSTONE III, HOYT Weeks in Treatment: 0 Fall Risk Assessment Items Have you had 2 or more falls in the last 12 monthso 0 No Have you had any fall that resulted in injury in the last 12 monthso 0 No FALL RISK ASSESSMENT: History of falling - immediate or within 3 months 0 No Secondary diagnosis 0 No Ambulatory aid None/bed rest/wheelchair/nurse 0  No Crutches/cane/walker 0 No Furniture 0 No IV Access/Saline Lock 0 No Gait/Training Normal/bed rest/immobile 0 No Weak 0 No Impaired 0 No Mental Status Oriented to own ability 0 Yes Electronic Signature(s) Signed: 02/20/2017 4:59:12 PM By: Alejandro Mulling Entered By: Alejandro Mulling on 02/20/2017 10:46:13 Janet Simpers ([id number]) -------------------------------------------------------------------------------- Foot Assessment Details Patient Name: Janet Simpers Date of Service: 02/20/2017 10:30 AM Medical Record Number: [id number] Patient Account Number: [id number] Date of Birth/Sex: [birth year] (70 y.o. Female) Treating RN: Ashok Cordia, Debi Primary Care Mariann Palo: Milon Score Other Clinician: Referring Phelicia Dantes: Referral, Self Treating Chin Wachter/Extender: STONE III, HOYT Weeks in Treatment: 0 Foot Assessment Items Site Locations + = Sensation present, - = Sensation absent, C = Callus, U = Ulcer R = Redness, W = Warmth, M = Maceration, PU = Pre-ulcerative lesion F = Fissure, S = Swelling, D = Dryness Assessment Right: Left: Other Deformity: No No Prior Foot Ulcer: No No Prior  Amputation: No No Charcot Joint: No No Ambulatory Status: Ambulatory Without Help Gait: Steady Electronic Signature(s) Signed: 02/20/2017 4:59:12 PM By: Alejandro Mulling Entered By: Alejandro Mulling on 02/20/2017 10:46:35 Janet Simpers ([id number]) -------------------------------------------------------------------------------- Nutrition Risk Assessment Details Patient Name: Janet Simpers Date of Service: 02/20/2017 10:30 AM Medical Record Number: [id number] Patient Account Number: [id number] Date of Birth/Sex: 11-28-1945 (70 y.o. Female) Treating RN: Ashok Cordia, Debi Primary Care Butler Vegh: Milon Score Other Clinician: Referring Edgar Reisz: Referral, Self Treating Ahmya Bernick/Extender: STONE III, HOYT Weeks in Treatment: 0 Height (in): 67 Weight (lbs): 202 Body Mass Index (BMI): 31.6 Nutrition Risk Assessment Items NUTRITION RISK SCREEN: I have an illness or condition that made me change the kind and/or 0 No amount of food I eat I eat fewer than two meals per day 0 No I eat few fruits and vegetables, or milk products 0 No I have three or more drinks of beer, liquor or wine almost every day 0 No I have tooth or mouth problems that make it hard for me to eat 0 No I don't always have enough money to buy the food I need 0 No I eat alone most of the time 0 No I take three or more different prescribed or over-the-counter drugs a 1 Yes day Without wanting to, I have lost or gained 10 pounds in the last six 0 No months I am not always physically able to shop, cook and/or feed myself 0 No Nutrition Protocols Good Risk Protocol Moderate Risk Protocol Electronic Signature(s) Signed: 02/20/2017 4:59:12 PM By: Alejandro Mulling Entered By: Alejandro Mulling on 02/20/2017 10:46:18

## 2017-02-27 ENCOUNTER — Encounter: Payer: Medicare Other | Admitting: Physician Assistant

## 2017-02-27 DIAGNOSIS — E11622 Type 2 diabetes mellitus with other skin ulcer: Secondary | ICD-10-CM | POA: Diagnosis not present

## 2017-02-28 NOTE — Progress Notes (Signed)
EEVA, BRANDT ([id number]) Visit Report for 02/27/2017 Arrival Information Details [name], [name] Date of Service: 02/27/2017 11:15 AM Medical Record Number: [id number] Patient Account Number: [id number] Date of Birth/Sex: 12-11-1945 (70 y.o. Female) Treating RN: Ashok Cordia, Debi Primary Care Alika Eppes: Milon Score Other Clinician: Referring Belmont Valli: Referral, Self Treating Cherilynn Schomburg/Extender: Linwood Dibbles, HOYT Weeks in Treatment: 1 Visit Information History Since Last Visit All ordered tests and consults were completed: No Patient Arrived: Ambulatory Added or deleted any medications: No Arrival Time: 11:17 Any new allergies or adverse reactions: No Accompanied By: self Had a fall or experienced change in No Transfer Assistance: None activities of daily living that may affect Patient Identification Verified: Yes risk of falls: Secondary Verification Process Yes Signs or symptoms of abuse/neglect since last No Completed: visito Patient Requires Transmission-Based No Hospitalized since last visit: No Precautions: Has Dressing in Place as Prescribed: Yes Patient Has Alerts: Yes Pain Present Now: No Patient Alerts: DM II Electronic Signature(s) Signed: 02/27/2017 2:40:18 PM By: Alejandro Mulling Entered By: Alejandro Mulling on 02/27/2017 11:19:01 [name] ([id number]) -------------------------------------------------------------------------------- Clinic Level of Care Assessment Details [name] Date of Service: 02/27/2017 11:15 AM Medical Record Number: [id number] Patient Account Number: [id number] Date of Birth/Sex: [birth year] (70 y.o. Female) Treating RN: Huel Coventry Primary Care Gokul Waybright: Milon Score Other Clinician: Referring Staley Budzinski: Referral, Self Treating Maeby Vankleeck/Extender: Linwood Dibbles, HOYT Weeks in Treatment: 1 Clinic Level of Care Assessment Items TOOL 4 Quantity Score []  - Use when only an EandM is performed on FOLLOW-UP  visit 0 ASSESSMENTS - Nursing Assessment / Reassessment []  - Reassessment of Co-morbidities (includes updates in patient status) 0 X - Reassessment of Adherence to Treatment Plan 1 5 ASSESSMENTS - Wound and Skin Assessment / Reassessment X - Simple Wound Assessment / Reassessment - one wound 1 5 []  - Complex Wound Assessment / Reassessment - multiple wounds 0 []  - Dermatologic / Skin Assessment (not related to wound area) 0 ASSESSMENTS - Focused Assessment []  - Circumferential Edema Measurements - multi extremities 0 []  - Nutritional Assessment / Counseling / Intervention 0 []  - Lower Extremity Assessment (monofilament, tuning fork, pulses) 0 []  - Peripheral Arterial Disease Assessment (using hand held doppler) 0 ASSESSMENTS - Ostomy and/or Continence Assessment and Care []  - Incontinence Assessment and Management 0 []  - Ostomy Care Assessment and Management (repouching, etc.) 0 PROCESS - Coordination of Care X - Simple Patient / Family Education for ongoing care 1 15 []  - Complex (extensive) Patient / Family Education for ongoing care 0 []  - Staff obtains Chiropractor, Records, Test Results / Process Orders 0 []  - Staff telephones HHA, Nursing Homes / Clarify orders / etc 0 []  - Routine Transfer to another Facility (non-emergent condition) 0 Keirsey, Maddyx C. ([id number]) []  - Routine Hospital Admission (non-emergent condition) 0 []  - New Admissions / Manufacturing engineer / Ordering NPWT, Apligraf, etc. 0 []  - Emergency Hospital Admission (emergent condition) 0 X - Simple Discharge Coordination 1 10 []  - Complex (extensive) Discharge Coordination 0 PROCESS - Special Needs []  - Pediatric / Minor Patient Management 0 []  - Isolation Patient Management 0 []  - Hearing / Language / Visual special needs 0 []  - Assessment of Community assistance (transportation, D/C planning, etc.) 0 []  - Additional assistance / Altered mentation 0 []  - Support Surface(s) Assessment (bed, cushion, seat,  etc.) 0 INTERVENTIONS - Wound Cleansing / Measurement X - Simple Wound Cleansing - one wound 1 5 []  - Complex Wound Cleansing - multiple wounds 0 X -  Wound Imaging (photographs - any number of wounds) 1 5 []  - Wound Tracing (instead of photographs) 0 X - Simple Wound Measurement - one wound 1 5 []  - Complex Wound Measurement - multiple wounds 0 INTERVENTIONS - Wound Dressings []  - Small Wound Dressing one or multiple wounds 0 X - Medium Wound Dressing one or multiple wounds 1 15 []  - Large Wound Dressing one or multiple wounds 0 []  - Application of Medications - topical 0 []  - Application of Medications - injection 0 INTERVENTIONS - Miscellaneous []  - External ear exam 0 Storer, Mychal C. ([id number]) []  - Specimen Collection (cultures, biopsies, blood, body fluids, etc.) 0 []  - Specimen(s) / Culture(s) sent or taken to Lab for analysis 0 []  - Patient Transfer (multiple staff / Michiel Sites Lift / Similar devices) 0 []  - Simple Staple / Suture removal (25 or less) 0 []  - Complex Staple / Suture removal (26 or more) 0 []  - Hypo / Hyperglycemic Management (close monitor of Blood Glucose) 0 []  - Ankle / Brachial Index (ABI) - do not check if billed separately 0 X - Vital Signs 1 5 Has the patient been seen at the hospital within the last three years: Yes Total Score: 70 Level Of Care: New/Established - Level 2 Electronic Signature(s) Signed: 02/27/2017 1:20:34 PM By: Elliot Gurney, BSN, RN, CWS, Kim RN, BSN Entered By: Elliot Gurney, BSN, RN, CWS, Kim on 02/27/2017 12:31:24 IO, [name] -------------------------------------------------------------------------------- Encounter Discharge Information Details [name] Date of Service: 02/27/2017 11:15 AM Medical Record Number: [id number] Patient Account Number: [id number] Date of Birth/Sex: [birth year] (70 y.o. Female) Treating RN: Huel Coventry Primary Care Alexandrina Fiorini: Milon Score Other Clinician: Referring Tyre Beaver: Referral,  Self Treating George Alcantar/Extender: Linwood Dibbles, HOYT Weeks in Treatment: 1 Encounter Discharge Information Items Discharge Pain Level: 0 Discharge Condition: Stable Ambulatory Status: Ambulatory Discharge Destination: Home Transportation: Private Auto Accompanied By: self Schedule Follow-up Appointment: Yes Medication Reconciliation completed Yes and provided to Patient/Care Nazair Fortenberry: Patient Clinical Summary of Care: Declined Electronic Signature(s) Signed: 02/27/2017 1:20:34 PM By: Elliot Gurney, BSN, RN, CWS, Kim RN, BSN Entered By: Elliot Gurney, BSN, RN, CWS, Kim on 02/27/2017 12:32:37 [name] ([id number]) -------------------------------------------------------------------------------- Lower Extremity Assessment Details [name] Date of Service: 02/27/2017 11:15 AM Medical Record Number: [id number] Patient Account Number: [id number] Date of Birth/Sex: 1946-05-12 (70 y.o. Female) Treating RN: Ashok Cordia, Debi Primary Care Torre Schaumburg: Milon Score Other Clinician: Referring Jenavee Laguardia: Referral, Self Treating Tristram Milian/Extender: STONE III, HOYT Weeks in Treatment: 1 Vascular Assessment Pulses: Dorsalis Pedis Palpable: [Right:Yes] Posterior Tibial Extremity colors, hair growth, and conditions: Extremity Color: [Right:Normal] Temperature of Extremity: [Right:Warm] Capillary Refill: [Right:< 3 seconds] Toe Nail Assessment Left: Right: Thick: No Discolored: No Deformed: No Improper Length and Hygiene: No Electronic Signature(s) Signed: 02/27/2017 2:40:18 PM By: Alejandro Mulling Entered By: Alejandro Mulling on 02/27/2017 11:26:49 [name] ([id number]) -------------------------------------------------------------------------------- Multi Wound Chart Details [name] Date of Service: 02/27/2017 11:15 AM Medical Record Number: [id number] Patient Account Number: [id number] Date of Birth/Sex: 1946/06/29 (70 y.o. Female) Treating RN:  Ashok Cordia, Debi Primary Care Nilah Belcourt: Milon Score Other Clinician: Referring Kyllie Pettijohn: Referral, Self Treating Daniyla Pfahler/Extender: STONE III, HOYT Weeks in Treatment: 1 Vital Signs Height(in): 67 Pulse(bpm): 66 Weight(lbs): 202 Blood Pressure 124/63 (mmHg): Body Mass Index(BMI): 32 Temperature(F): 98.2 Respiratory Rate 20 (breaths/min): Photos: [2:No Photos] [3:No Photos] [N/A:N/A] Wound Location: [2:Right Lower Leg - Anterior, Distal] [3:Right Lower Leg - Anterior, Proximal] [N/A:N/A] Wounding Event: [2:Gradually Appeared] [3:Gradually Appeared] [N/A:N/A] Primary Etiology: [2:Diabetic Wound/Ulcer of  the Lower Extremity] [3:Diabetic Wound/Ulcer of the Lower Extremity] [N/A:N/A] Comorbid History: [2:Chronic Obstructive Pulmonary Disease (COPD), Hypertension, Type II Diabetes] [3:Chronic Obstructive Pulmonary Disease (COPD), Hypertension, Type II Diabetes] [N/A:N/A] Date Acquired: [2:02/14/2017] [3:02/14/2017] [N/A:N/A] Weeks of Treatment: [2:1] [3:1] [N/A:N/A] Wound Status: [2:Open] [3:Open] [N/A:N/A] Measurements L x W x D 0.2x0.2x0.2 [3:0.2x0.1x0.1] [N/A:N/A] (cm) Area (cm) : [2:0.031] [3:0.016] [N/A:N/A] Volume (cm) : [2:0.006] [3:0.002] [N/A:N/A] % Reduction in Area: [2:-93.70%] [3:0.00%] [N/A:N/A] % Reduction in Volume: -100.00% [3:0.00%] [N/A:N/A] Position 1 (o'clock): 6 [3:9] Maximum Distance 1 1 [3:0.7] (cm): Tunneling: [2:Yes] [3:Yes] [N/A:N/A] Classification: [2:Grade 1] [3:Grade 1] [N/A:N/A] Exudate Amount: [2:Large] [3:Large] [N/A:N/A] Exudate Type: [2:Serosanguineous] [3:Serosanguineous] [N/A:N/A] Exudate Color: [2:red, brown] [3:red, brown] [N/A:N/A] Wound Margin: [2:Distinct, outline attached] [3:Distinct, outline attached] [N/A:N/A] Granulation Amount: [2:Large (67-100%)] [3:Large (67-100%)] [N/A:N/A] Granulation Quality: [2:N/A] [3:Red] [N/A:N/A] Necrotic Amount: None Present (0%) None Present (0%) N/A Epithelialization: N/A None N/A Periwound Skin  Texture: No Abnormalities Noted No Abnormalities Noted N/A Periwound Skin No Abnormalities Noted No Abnormalities Noted N/A Moisture: Periwound Skin Color: No Abnormalities Noted No Abnormalities Noted N/A Temperature: No Abnormality No Abnormality N/A Tenderness on Yes Yes N/A Palpation: Wound Preparation: Ulcer Cleansing: Ulcer Cleansing: N/A Rinsed/Irrigated with Rinsed/Irrigated with Saline Saline Topical Anesthetic Topical Anesthetic Applied: Other: lidocaine4 Applied: Other: lidocaine % 4% Treatment Notes Electronic Signature(s) Signed: 02/27/2017 2:40:18 PM By: Alejandro Mulling Entered By: Alejandro Mulling on 02/27/2017 11:32:41 AMBERLEE, GARVEY ([id number]) -------------------------------------------------------------------------------- Multi-Disciplinary Care Plan Details [name] Date of Service: 02/27/2017 11:15 AM Medical Record Number: [id number] Patient Account Number: [id number] Date of Birth/Sex: 1946/07/12 (70 y.o. Female) Treating RN: Ashok Cordia, Debi Primary Care Tegan Britain: Milon Score Other Clinician: Referring Jahzara Slattery: Referral, Self Treating Arjun Hard/Extender: Linwood Dibbles, HOYT Weeks in Treatment: 1 Active Inactive ` Orientation to the Wound Care Program Nursing Diagnoses: Knowledge deficit related to the wound healing center program Goals: Patient/caregiver will verbalize understanding of the Wound Healing Center Program Date Initiated: 02/20/2017 Target Resolution Date: 03/22/2017 Goal Status: Active Interventions: Provide education on orientation to the wound center Notes: ` Pain, Acute or Chronic Nursing Diagnoses: Pain, acute or chronic: actual or potential Potential alteration in comfort, pain Goals: Patient/caregiver will verbalize adequate pain control between visits Date Initiated: 02/20/2017 Target Resolution Date: 05/24/2017 Goal Status: Active Interventions: Complete pain assessment as per visit  requirements Notes: ` Wound/Skin Impairment Nursing Diagnoses: Impaired tissue integrity Harriss, Khylei C. ([id number]) Knowledge deficit related to ulceration/compromised skin integrity Goals: Ulcer/skin breakdown will have a volume reduction of 80% by week 12 Date Initiated: 02/20/2017 Target Resolution Date: 06/14/2017 Goal Status: Active Interventions: Assess patient/caregiver ability to perform ulcer/skin care regimen upon admission and as needed Assess ulceration(s) every visit Notes: Electronic Signature(s) Signed: 02/27/2017 2:40:18 PM By: Alejandro Mulling Entered By: Alejandro Mulling on 02/27/2017 11:32:32 [name] ([id number]) -------------------------------------------------------------------------------- Pain Assessment Details [name] Date of Service: 02/27/2017 11:15 AM Medical Record Number: [id number] Patient Account Number: [id number] Date of Birth/Sex: [birth year] (70 y.o. Female) Treating RN: Ashok Cordia, Debi Primary Care Kally Cadden: Milon Score Other Clinician: Referring Ellen Goris: Referral, Self Treating Arielle Eber/Extender: Linwood Dibbles, HOYT Weeks in Treatment: 1 Active Problems Location of Pain Severity and Description of Pain Patient Has Paino No Site Locations With Dressing Change: No Pain Management and Medication Current Pain Management: Electronic Signature(s) Signed: 02/27/2017 2:40:18 PM By: Alejandro Mulling Entered By: Alejandro Mulling on 02/27/2017 11:19:07 [name] ([id number]) -------------------------------------------------------------------------------- Patient/Caregiver Education Details [name] Date of Service: 02/27/2017 11:15 AM Medical Record Number: [id number]  Patient Account Number: [id number] Date of Birth/Gender: [birth year] (71 y.o. Female) Treating RN: Huel Coventry Primary Care Physician: Milon Score Other Clinician: Referring Physician: Referral, Self Treating  Physician/Extender: Skeet Simmer in Treatment: 1 Education Assessment Education Provided To: Patient Education Topics Provided Wound/Skin Impairment: Handouts: Caring for Your Ulcer, Other: continue wound care as prescribed Methods: Demonstration, Explain/Verbal Responses: State content correctly Electronic Signature(s) Signed: 02/27/2017 1:20:34 PM By: Elliot Gurney, BSN, RN, CWS, Kim RN, BSN Entered By: Elliot Gurney, BSN, RN, CWS, Kim on 02/27/2017 12:32:57 SHANAIA, SIEVERS ([id number]) -------------------------------------------------------------------------------- Wound Assessment Details [name] Date of Service: 02/27/2017 11:15 AM Medical Record Number: [id number] Patient Account Number: [id number] Date of Birth/Sex: Feb 20, 1946 (70 y.o. Female) Treating RN: Ashok Cordia, Debi Primary Care Donnielle Addison: Milon Score Other Clinician: Referring Recardo Linn: Referral, Self Treating Colbie Sliker/Extender: STONE III, HOYT Weeks in Treatment: 1 Wound Status Wound Number: 2 Primary Diabetic Wound/Ulcer of the Lower Etiology: Extremity Wound Location: Right Lower Leg - Anterior, Distal Wound Open Status: Wounding Event: Gradually Appeared Comorbid Chronic Obstructive Pulmonary Date Acquired: 02/14/2017 History: Disease (COPD), Hypertension, Type Weeks Of Treatment: 1 II Diabetes Clustered Wound: No Photos Photo Uploaded By: Alejandro Mulling on 02/27/2017 13:56:20 Wound Measurements Length: (cm) 0.2 Width: (cm) 0.2 Depth: (cm) 0.2 Area: (cm) 0.031 Volume: (cm) 0.006 % Reduction in Area: -93.7% % Reduction in Volume: -100% Tunneling: Yes Position (o'clock): 6 Maximum Distance: (cm) 1 Wound Description Classification: Grade 1 Foul Odor Aft Wound Margin: Distinct, outline attached Slough/Fibrin Exudate Amount: Large Exudate Type: Serosanguineous Exudate Color: red, brown er Cleansing: No o No Wound Bed Granulation Amount: Large (67-100%) Necrotic Amount: None  Present (0%) Corpus, Kenedie C. ([id number]) Periwound Skin Texture Texture Color No Abnormalities Noted: No No Abnormalities Noted: No Moisture Temperature / Pain No Abnormalities Noted: No Temperature: No Abnormality Tenderness on Palpation: Yes Wound Preparation Ulcer Cleansing: Rinsed/Irrigated with Saline Topical Anesthetic Applied: Other: lidocaine4 %, Treatment Notes Wound #2 (Right, Distal, Anterior Lower Leg) 2. Anesthetic Topical Lidocaine 4% cream to wound bed prior to debridement 4. Dressing Applied: Other dressing (specify in notes) 5. Secondary Dressing Applied Dry Gauze 7. Secured with Other (specify in notes) Notes Endoform, bolster and ace wrap to secure. Electronic Signature(s) Signed: 02/27/2017 2:40:18 PM By: Alejandro Mulling Entered By: Alejandro Mulling on 02/27/2017 11:24:01 [name] ([id number]) -------------------------------------------------------------------------------- Wound Assessment Details [name] Date of Service: 02/27/2017 11:15 AM Medical Record Number: [id number] Patient Account Number: [id number] Date of Birth/Sex: [birth year] (70 y.o. Female) Treating RN: Ashok Cordia, Debi Primary Care Kana Reimann: Milon Score Other Clinician: Referring Ayrianna Mcginniss: Referral, Self Treating Jabriel Vanduyne/Extender: STONE III, HOYT Weeks in Treatment: 1 Wound Status Wound Number: 3 Primary Diabetic Wound/Ulcer of the Lower Etiology: Extremity Wound Location: Right Lower Leg - Anterior, Proximal Wound Open Status: Wounding Event: Gradually Appeared Comorbid Chronic Obstructive Pulmonary Date Acquired: 02/14/2017 History: Disease (COPD), Hypertension, Type Weeks Of Treatment: 1 II Diabetes Clustered Wound: No Photos Photo Uploaded By: Alejandro Mulling on 02/27/2017 13:56:46 Wound Measurements Length: (cm) 0.2 Width: (cm) 0.1 Depth: (cm) 0.1 Area: (cm) 0.016 Volume: (cm) 0.002 % Reduction in Area: 0% % Reduction in Volume:  0% Epithelialization: None Tunneling: Yes Position (o'clock): 9 Maximum Distance: (cm) 0.7 Undermining: No Wound Description Classification: Grade 1 Foul Odor Aft Wound Margin: Distinct, outline attached Slough/Fibrin Exudate Amount: Large Exudate Type: Serosanguineous Exudate Color: red, brown er Cleansing: No o No Wound Bed Kathman, Vieva C. ([id number]) Granulation Amount: Large (67-100%) Granulation Quality: Red Necrotic Amount: None Present (0%) Periwound Skin Texture  Texture Color No Abnormalities Noted: No No Abnormalities Noted: No Moisture Temperature / Pain No Abnormalities Noted: No Temperature: No Abnormality Tenderness on Palpation: Yes Wound Preparation Ulcer Cleansing: Rinsed/Irrigated with Saline Topical Anesthetic Applied: Other: lidocaine 4%, Treatment Notes Wound #3 (Right, Proximal, Anterior Lower Leg) 2. Anesthetic Topical Lidocaine 4% cream to wound bed prior to debridement 4. Dressing Applied: Other dressing (specify in notes) 5. Secondary Dressing Applied Dry Gauze 7. Secured with Other (specify in notes) Notes Endoform, bolster and ace wrap to secure. Electronic Signature(s) Signed: 02/27/2017 2:40:18 PM By: Alejandro Mulling Entered By: Alejandro Mulling on 02/27/2017 11:24:47 [name] ([id number]) -------------------------------------------------------------------------------- Vitals Details [name] Date of Service: 02/27/2017 11:15 AM Medical Record Number: [id number] Patient Account Number: [id number] Date of Birth/Sex: Mar 03, 1946 (70 y.o. Female) Treating RN: Ashok Cordia, Debi Primary Care Lynette Noah: Milon Score Other Clinician: Referring Kharis Lapenna: Referral, Self Treating Eder Macek/Extender: STONE III, HOYT Weeks in Treatment: 1 Vital Signs Time Taken: 11:26 Temperature (F): 98.2 Height (in): 67 Pulse (bpm): 66 Weight (lbs): 202 Respiratory Rate (breaths/min): 20 Body Mass Index (BMI): 31.6 Blood  Pressure (mmHg): 124/63 Reference Range: 80 - 120 mg / dl Electronic Signature(s) Signed: 02/27/2017 2:40:18 PM By: Alejandro Mulling Entered By: Alejandro Mulling on 02/27/2017 11:26:22

## 2017-02-28 NOTE — Progress Notes (Signed)
Janet, [name] ([id number]) Visit Report for 02/27/2017 Chief Complaint Document Details [name], MARMOR Date of Service: 02/27/2017 11:15 AM Medical Record Number: [id number] Patient Account Number: [id number] Date of Birth/Sex: 06-30-1946 (71 y.o. Female) Treating RN: Huel Coventry Primary Care Provider: Milon Score Other Clinician: Referring Provider: Referral, Self Treating Provider/Extender: Linwood Dibbles, Fujie Dickison Weeks in Treatment: 1 Information Obtained from: Patient Chief Complaint patient is here for follow-up evaluation of a right lower extremity ulcer Electronic Signature(s) Signed: 02/27/2017 12:41:32 PM By: Lenda Kelp PA-C Entered By: Lenda Kelp on 02/27/2017 11:42:14 SAHANA, GIANNATTASIO ([id number]) -------------------------------------------------------------------------------- HPI Details [name] Date of Service: 02/27/2017 11:15 AM Medical Record Number: [id number] Patient Account Number: [id number] Date of Birth/Sex: [birth year] (71 y.o. Female) Treating RN: Huel Coventry Primary Care Provider: Milon Score Other Clinician: Referring Provider: Referral, Self Treating Provider/Extender: Linwood Dibbles, Logun Colavito Weeks in Treatment: 1 History of Present Illness Location: right lower extremity in the area of the lateral calf below the knee Quality: She is having no pain Context: The wound occurred when the patient a blunt injury and fall injury Modifying Factors: None as patient was healed Associated Signs and Symptoms: Patient reports having increase discharge. HPI Description: 71 year old patient has been referred to as with right lower extremity wound with edema and ecchymosis, after having a injury due to a fall on 08/20/2016. she has a history of diabetes mellitus type 2 which is uncomplicated, chronic back pain, COPD, hypertension, hypothyroidism, supraventricular tachycardia and tobacco abuse. He is status post neck surgery, appendectomy,  cholecystectomy, hernia repair. He smokes about half a pack of cigarettes a day for the last 50 years. Recently on doxycycline for 10 days and was given symptomatic treatment for wound. 10/04/2016 -- the patient tolerated the snap vacuum system well and had no significant problems. She continues to smoke and is trying to give it up. 10/11/2016 -- she continues to tolerate the snap like him system fairly well and other than that she has no fresh issues 11/01/16- she is here for follow-up evaluation of her right lower extremity ulcer. She is tolerating the SNAP negative pressure system. She is voicing no complaints of pain. She states her glucose levels are consistently less than 150 with her evening glucose levels being higher than morning glucose levels. She continues to smoke, approximately 0.5-ppd which is a reduction of 1-ppd approximately 6 months ago. 11/08/16 patient on evaluation today fortunately does not appear to be having as much discomfort as she has in the past. She is tolerating the dressing changes well although she does note some irritation where the wound VAC has been applied. She specifically wonders if we could see about taking a break possibly progressing to discontinuation of the wound VAC. There is no evidence of infection. 12/20/2016 -- we had prepared to use a Grafix skin substitute today but her wound had improved remarkably and we did not use this Readmission: 02/20/17 on evaluation today patient's wound which had previously healed in June 2018 unfortunately had two areas that began to drain in the past week. She therefore called to be reevaluated in regard to this wound. At the location of the healed wound server she has two panel locations one at 9 o'clock and one at 6 o'clock it appeared to be draining fluid and it seems that she did not fully heal underneath the wound as this close. This therefore trapped fluid and now has reopened. Fortunately it seems to be mild and  I think this will likely close and heal very quickly although obviously this is frustrating for the patient and she thought that she was done with this wound. No fevers, chills, nausea, or vomiting noted at this time. Patient is having no Elman, Nelma C. ([id number]) pain. 02/27/17 on evaluation today patient's right lower extremity ulcer appears to be doing about the same. We have not made a lot of improvements. Fortunately we did get the results of her culture back which showed no growth this was good news. Again I was not overly convinced that this would be infected but nonetheless we wanted to be sure just considering how it was progressing. Fortunately she has no significant pain other than with dressing changes and then it's around a two out of 10. No fevers, chills, nausea, or vomiting noted at this time. Electronic Signature(s) Signed: 02/27/2017 12:41:32 PM By: Lenda Kelp PA-C Entered By: Lenda Kelp on 02/27/2017 11:43:10 [name] ([id number]) -------------------------------------------------------------------------------- Physical Exam Details [name] Date of Service: 02/27/2017 11:15 AM Medical Record Number: [id number] Patient Account Number: [id number] Date of Birth/Sex: 1946-04-19 (71 y.o. Female) Treating RN: Huel Coventry Primary Care Provider: Milon Score Other Clinician: Referring Provider: Referral, Self Treating Provider/Extender: STONE III, Adalyna Godbee Weeks in Treatment: 1 Constitutional Well-nourished and well-hydrated in no acute distress. Respiratory normal breathing without difficulty. clear to auscultation bilaterally. Cardiovascular regular rate and rhythm with normal S1, S2. Psychiatric this patient is able to make decisions and demonstrates good insight into disease process. Alert and Oriented x 3. pleasant and cooperative. Notes Patient's wound appears to show no change at this point. There's no purulent discharge and  overall no significant improvement. Fortunately there's also no surrounding erythema. Electronic Signature(s) Signed: 02/27/2017 12:41:32 PM By: Lenda Kelp PA-C Entered By: Lenda Kelp on 02/27/2017 11:43:50 [name] ([id number]) -------------------------------------------------------------------------------- Physician Orders Details [name] Date of Service: 02/27/2017 11:15 AM Medical Record Number: [id number] Patient Account Number: [id number] Date of Birth/Sex: 17-Apr-1946 (71 y.o. Female) Treating RN: Huel Coventry Primary Care Provider: Milon Score Other Clinician: Referring Provider: Referral, Self Treating Provider/Extender: Linwood Dibbles, Kennadie Brenner Weeks in Treatment: 1 Verbal / Phone Orders: No Diagnosis Coding ICD-10 Coding Code Description E11.622 Type 2 diabetes mellitus with other skin ulcer L97.212 Non-pressure chronic ulcer of right calf with fat layer exposed F17.218 Nicotine dependence, cigarettes, with other nicotine-induced disorders Wound Cleansing Wound #2 Right,Distal,Anterior Lower Leg o Clean wound with Normal Saline. o Cleanse wound with mild soap and water Wound #3 Right,Proximal,Anterior Lower Leg o Clean wound with Normal Saline. o Cleanse wound with mild soap and water Anesthetic Wound #2 Right,Distal,Anterior Lower Leg o Topical Lidocaine 4% cream applied to wound bed prior to debridement Wound #3 Right,Proximal,Anterior Lower Leg o Topical Lidocaine 4% cream applied to wound bed prior to debridement Skin Barriers/Peri-Wound Care Wound #2 Right,Distal,Anterior Lower Leg o Skin Prep Wound #3 Right,Proximal,Anterior Lower Leg o Skin Prep Primary Wound Dressing Wound #2 Right,Distal,Anterior Lower Leg o Other: - Endoform Wound #3 Right,Proximal,Anterior Lower Leg o Other: - Endoform Gramling, Eimi C. ([id number]) Secondary Dressing Wound #2 Right,Distal,Anterior Lower Leg o Dry Gauze - bolster o  Other - ace wrap to secure. Dressing Change Frequency Wound #2 Right,Distal,Anterior Lower Leg o Three times weekly Wound #3 Right,Proximal,Anterior Lower Leg o Three times weekly Follow-up Appointments Wound #2 Right,Distal,Anterior Lower Leg o Return Appointment in 1 week. Wound #3 Right,Proximal,Anterior Lower Leg o Return Appointment in 1 week. Edema Control Wound #2  Right,Distal,Anterior Lower Leg o Support Garment 10-20 mm/Hg pressure to: Wound #3 Right,Proximal,Anterior Lower Leg o Support Garment 10-20 mm/Hg pressure to: Additional Orders / Instructions Wound #2 Right,Distal,Anterior Lower Leg o Increase protein intake. Wound #3 Right,Proximal,Anterior Lower Leg o Increase protein intake. Electronic Signature(s) Signed: 02/27/2017 12:41:32 PM By: Lenda Kelp PA-C Signed: 02/27/2017 1:20:34 PM By: Elliot Gurney BSN, RN, CWS, Kim RN, BSN Entered By: Elliot Gurney, BSN, RN, CWS, Kim on 02/27/2017 12:30:53 ANASTASIA, TOMPSON ([id number]) -------------------------------------------------------------------------------- Problem List Details [name] Date of Service: 02/27/2017 11:15 AM Medical Record Number: [id number] Patient Account Number: [id number] Date of Birth/Sex: 1945-09-26 (71 y.o. Female) Treating RN: Huel Coventry Primary Care Provider: Milon Score Other Clinician: Referring Provider: Referral, Self Treating Provider/Extender: Linwood Dibbles, Dyllin Gulley Weeks in Treatment: 1 Active Problems ICD-10 Encounter Code Description Active Date Diagnosis E11.622 Type 2 diabetes mellitus with other skin ulcer 02/20/2017 Yes L97.212 Non-pressure chronic ulcer of right calf with fat layer 02/20/2017 Yes exposed F17.218 Nicotine dependence, cigarettes, with other nicotine- 02/20/2017 Yes induced disorders Inactive Problems Resolved Problems Electronic Signature(s) Signed: 02/27/2017 12:41:32 PM By: Lenda Kelp PA-C Entered By: Lenda Kelp on 02/27/2017  11:31:20 [name] ([id number]) -------------------------------------------------------------------------------- Progress Note Details [name] Date of Service: 02/27/2017 11:15 AM Medical Record Number: [id number] Patient Account Number: [id number] Date of Birth/Sex: 1946-05-22 (71 y.o. Female) Treating RN: Huel Coventry Primary Care Provider: Milon Score Other Clinician: Referring Provider: Referral, Self Treating Provider/Extender: Linwood Dibbles, Shenequa Howse Weeks in Treatment: 1 Subjective Chief Complaint Information obtained from Patient patient is here for follow-up evaluation of a right lower extremity ulcer History of Present Illness (HPI) The following HPI elements were documented for the patient's wound: Location: right lower extremity in the area of the lateral calf below the knee Quality: She is having no pain Context: The wound occurred when the patient a blunt injury and fall injury Modifying Factors: None as patient was healed Associated Signs and Symptoms: Patient reports having increase discharge. 71 year old patient has been referred to as with right lower extremity wound with edema and ecchymosis, after having a injury due to a fall on 08/20/2016. she has a history of diabetes mellitus type 2 which is uncomplicated, chronic back pain, COPD, hypertension, hypothyroidism, supraventricular tachycardia and tobacco abuse. He is status post neck surgery, appendectomy, cholecystectomy, hernia repair. He smokes about half a pack of cigarettes a day for the last 50 years. Recently on doxycycline for 10 days and was given symptomatic treatment for wound. 10/04/2016 -- the patient tolerated the snap vacuum system well and had no significant problems. She continues to smoke and is trying to give it up. 10/11/2016 -- she continues to tolerate the snap like him system fairly well and other than that she has no fresh issues 11/01/16- she is here for follow-up  evaluation of her right lower extremity ulcer. She is tolerating the SNAP negative pressure system. She is voicing no complaints of pain. She states her glucose levels are consistently less than 150 with her evening glucose levels being higher than morning glucose levels. She continues to smoke, approximately 0.5-ppd which is a reduction of 1-ppd approximately 6 months ago. 11/08/16 patient on evaluation today fortunately does not appear to be having as much discomfort as she has in the past. She is tolerating the dressing changes well although she does note some irritation where the wound VAC has been applied. She specifically wonders if we could see about taking a break possibly  progressing to discontinuation of the wound VAC. There is no evidence of infection. 12/20/2016 -- we had prepared to use a Grafix skin substitute today but her wound had improved remarkably and we did not use this Readmission: KASHARA, BLOCHER ([id number]) 02/20/17 on evaluation today patient's wound which had previously healed in June 2018 unfortunately had two areas that began to drain in the past week. She therefore called to be reevaluated in regard to this wound. At the location of the healed wound server she has two panel locations one at 9 o'clock and one at 6 o'clock it appeared to be draining fluid and it seems that she did not fully heal underneath the wound as this close. This therefore trapped fluid and now has reopened. Fortunately it seems to be mild and I think this will likely close and heal very quickly although obviously this is frustrating for the patient and she thought that she was done with this wound. No fevers, chills, nausea, or vomiting noted at this time. Patient is having no pain. 02/27/17 on evaluation today patient's right lower extremity ulcer appears to be doing about the same. We have not made a lot of improvements. Fortunately we did get the results of her culture back which showed no  growth this was good news. Again I was not overly convinced that this would be infected but nonetheless we wanted to be sure just considering how it was progressing. Fortunately she has no significant pain other than with dressing changes and then it's around a two out of 10. No fevers, chills, nausea, or vomiting noted at this time. Objective Constitutional Well-nourished and well-hydrated in no acute distress. Vitals Time Taken: 11:26 AM, Height: 67 in, Weight: 202 lbs, BMI: 31.6, Temperature: 98.2 F, Pulse: 66 bpm, Respiratory Rate: 20 breaths/min, Blood Pressure: 124/63 mmHg. Respiratory normal breathing without difficulty. clear to auscultation bilaterally. Cardiovascular regular rate and rhythm with normal S1, S2. Psychiatric this patient is able to make decisions and demonstrates good insight into disease process. Alert and Oriented x 3. pleasant and cooperative. General Notes: Patient's wound appears to show no change at this point. There's no purulent discharge and overall no significant improvement. Fortunately there's also no surrounding erythema. Integumentary (Hair, Skin) Wound #2 status is Open. Original cause of wound was Gradually Appeared. The wound is located on the Right,Distal,Anterior Lower Leg. The wound measures 0.2cm length x 0.2cm width x 0.2cm depth; Samarin, Mycah C. ([id number]) 0.031cm^2 area and 0.006cm^3 volume. There is tunneling at 6:00 with a maximum distance of 1cm. There is a large amount of serosanguineous drainage noted. The wound margin is distinct with the outline attached to the wound base. There is large (67-100%) granulation within the wound bed. There is no necrotic tissue within the wound bed. Periwound temperature was noted as No Abnormality. The periwound has tenderness on palpation. Wound #3 status is Open. Original cause of wound was Gradually Appeared. The wound is located on the Right,Proximal,Anterior Lower Leg. The wound measures 0.2cm  length x 0.1cm width x 0.1cm depth; 0.016cm^2 area and 0.002cm^3 volume. There is no undermining noted, however, there is tunneling at 9:00 with a maximum distance of 0.7cm. There is a large amount of serosanguineous drainage noted. The wound margin is distinct with the outline attached to the wound base. There is large (67-100%) red granulation within the wound bed. There is no necrotic tissue within the wound bed. Periwound temperature was noted as No Abnormality. The periwound has tenderness on palpation. Assessment Active Problems  ICD-10 E11.622 - Type 2 diabetes mellitus with other skin ulcer L97.212 - Non-pressure chronic ulcer of right calf with fat layer exposed F17.218 - Nicotine dependence, cigarettes, with other nicotine-induced disorders Plan Wound Cleansing: Wound #2 Right,Distal,Anterior Lower Leg: Clean wound with Normal Saline. Cleanse wound with mild soap and water Wound #3 Right,Proximal,Anterior Lower Leg: Clean wound with Normal Saline. Cleanse wound with mild soap and water Anesthetic: Wound #2 Right,Distal,Anterior Lower Leg: Topical Lidocaine 4% cream applied to wound bed prior to debridement Wound #3 Right,Proximal,Anterior Lower Leg: Topical Lidocaine 4% cream applied to wound bed prior to debridement Skin Barriers/Peri-Wound Care: Wound #2 Right,Distal,Anterior Lower Leg: Skin Prep Wound #3 Right,Proximal,Anterior Lower Leg: Skin Prep Kerlin, Foye C. ([id number]) Primary Wound Dressing: Wound #2 Right,Distal,Anterior Lower Leg: Other: - Endoform Wound #3 Right,Proximal,Anterior Lower Leg: Other: - Endoform Secondary Dressing: Wound #2 Right,Distal,Anterior Lower Leg: Dry Gauze - bolster Other - ace wrap to secure. Dressing Change Frequency: Wound #2 Right,Distal,Anterior Lower Leg: Three times weekly Wound #3 Right,Proximal,Anterior Lower Leg: Three times weekly Follow-up Appointments: Wound #2 Right,Distal,Anterior Lower Leg: Return  Appointment in 1 week. Wound #3 Right,Proximal,Anterior Lower Leg: Return Appointment in 1 week. Edema Control: Wound #2 Right,Distal,Anterior Lower Leg: Support Garment 10-20 mm/Hg pressure to: Wound #3 Right,Proximal,Anterior Lower Leg: Support Garment 10-20 mm/Hg pressure to: Additional Orders / Instructions: Wound #2 Right,Distal,Anterior Lower Leg: Increase protein intake. Wound #3 Right,Proximal,Anterior Lower Leg: Increase protein intake. Following evaluation today we are going to proceed with doing a trial of in the form loosely placed within the two wound openings then covered with a dressing and subsequently an ace wrap to provide some pressure to help with fluid management and drainage. Patient is in agreement with this plan. We will see were things stand in one week. If anything worsens in the interim she will contact our office for additional recommendations. Otherwise hopefully she will continue to do well and hopefully this will show signs of improvement as of next visit. Electronic Signature(s) Signed: 02/27/2017 12:41:32 PM By: Lenda Kelp PA-C Entered By: Lenda Kelp on 02/27/2017 12:37:57 REGLA, FITZGIBBON ([id number]) -------------------------------------------------------------------------------- SuperBill Details [name] Date of Service: 02/27/2017 Medical Record Number: [id number] Patient Account Number: [id number] Date of Birth/Sex: 1946/05/31 (71 y.o. Female) Treating RN: Huel Coventry Primary Care Provider: Milon Score Other Clinician: Referring Provider: Referral, Self Treating Provider/Extender: Linwood Dibbles, Chala Gul Weeks in Treatment: 1 Diagnosis Coding ICD-10 Codes Code Description E11.622 Type 2 diabetes mellitus with other skin ulcer L97.212 Non-pressure chronic ulcer of right calf with fat layer exposed F17.218 Nicotine dependence, cigarettes, with other nicotine-induced disorders Facility Procedures CPT4 Code:  [id number] Description: [phone] - WOUND CARE VISIT-LEV 2 EST PT Modifier: Quantity: 1 Physician Procedures CPT4 Code Description: [id number] - WC PHYS LEVEL 3 - EST PT ICD-10 Description Diagnosis E11.622 Type 2 diabetes mellitus with other skin ulcer L97.212 Non-pressure chronic ulcer of right calf with fat l F17.218 Nicotine dependence, cigarettes, with  other nicotin Modifier: ayer exposed e-induced di Quantity: 1 sorders Electronic Signature(s) Signed: 02/27/2017 12:41:32 PM By: Lenda Kelp PA-C Signed: 02/27/2017 1:20:34 PM By: Elliot Gurney, BSN, RN, CWS, Kim RN, BSN Entered By: Elliot Gurney, BSN, RN, CWS, Kim on 02/27/2017 12:31:31

## 2017-03-07 ENCOUNTER — Encounter: Payer: Medicare Other | Admitting: Surgery

## 2017-03-07 DIAGNOSIS — E11622 Type 2 diabetes mellitus with other skin ulcer: Secondary | ICD-10-CM | POA: Diagnosis not present

## 2017-03-09 NOTE — Progress Notes (Signed)
Janet, [name] ([id number]) Visit Report for 03/07/2017 Chief Complaint Document Details [name], [name] Date of Service: 03/07/2017 11:00 AM Medical Record Number: [id number] Patient Account Number: [id number] Date of Birth/Sex: 05-01-1946 (71 y.o. Female) Treating RN: Curtis Sites Primary Care Provider: Milon Score Other Clinician: Referring Provider: Referral, Self Treating Provider/Extender: Rudene Re in Treatment: 2 Information Obtained from: Patient Chief Complaint patient is here for follow-up evaluation of a right lower extremity ulcer Electronic Signature(s) Signed: 03/07/2017 11:54:07 AM By: Evlyn Kanner MD, FACS Entered By: Evlyn Kanner on 03/07/2017 11:37:08 [name] ([id number]) -------------------------------------------------------------------------------- HPI Details [name] Date of Service: 03/07/2017 11:00 AM Medical Record Number: [id number] Patient Account Number: [id number] Date of Birth/Sex: 1945/10/05 (71 y.o. Female) Treating RN: Curtis Sites Primary Care Provider: Milon Score Other Clinician: Referring Provider: Referral, Self Treating Provider/Extender: Rudene Re in Treatment: 2 History of Present Illness Location: right lower extremity in the area of the lateral calf below the knee Quality: She is having no pain Context: The wound occurred when the patient a blunt injury and fall injury Modifying Factors: None as patient was healed Associated Signs and Symptoms: Patient reports having increase discharge. HPI Description: Readmission: 02/20/17 on evaluation today patient's wound which had previously healed in June 2018 unfortunately had two areas that began to drain in the past week. She therefore called to be reevaluated in regard to this wound. At the location of the healed wound server she has two panel locations one at 9 o'clock and one at 6 o'clock it appeared to be draining fluid  and it seems that she did not fully heal underneath the wound as this close. This therefore trapped fluid and now has reopened. Fortunately it seems to be mild and I think this will likely close and heal very quickly although obviously this is frustrating for the patient and she thought that she was done with this wound. No fevers, chills, nausea, or vomiting noted at this time. Patient is having no pain. 02/27/17 on evaluation today patient's right lower extremity ulcer appears to be doing about the same. We have not made a lot of improvements. Fortunately we did get the results of her culture back which showed no growth this was good news. Again I was not overly convinced that this would be infected but nonetheless we wanted to be sure just considering how it was progressing. Fortunately she has no significant pain other than with dressing changes and then it's around a two out of 10. No fevers, chills, nausea, or vomiting noted at this time. 03/07/2017 -- this patient was discharged on 12/27/2016 has not been wearing her compression stockings as advised but has been wearing TED hose which are basically useless for a problem. He continues to smoke about half pack of cigarettes a day ======== Old Notes 71 year old patient has been referred to as with right lower extremity wound with edema and ecchymosis, after having a injury due to a fall on 08/20/2016. she has a history of diabetes mellitus type 2 which is uncomplicated, chronic back pain, COPD, hypertension, hypothyroidism, supraventricular tachycardia and tobacco abuse. He is status post neck surgery, appendectomy, cholecystectomy, hernia repair. He smokes about half a pack of cigarettes a day for the last 50 years. Recently on doxycycline for 10 days and was given symptomatic treatment for wound. 10/04/2016 -- the patient tolerated the snap vacuum system well and had no significant problems. She continues to smoke and is trying to give  it  up. 10/11/2016 -- she continues to tolerate the snap like him system fairly well and other than that she has no Hermance, Dorisann C. ([id number]) fresh issues 11/01/16- she is here for follow-up evaluation of her right lower extremity ulcer. She is tolerating the SNAP negative pressure system. She is voicing no complaints of pain. She states her glucose levels are consistently less than 150 with her evening glucose levels being higher than morning glucose levels. She continues to smoke, approximately 0.5-ppd which is a reduction of 1-ppd approximately 6 months ago. 11/08/16 patient on evaluation today fortunately does not appear to be having as much discomfort as she has in the past. She is tolerating the dressing changes well although she does note some irritation where the wound VAC has been applied. She specifically wonders if we could see about taking a break possibly progressing to discontinuation of the wound VAC. There is no evidence of infection. 12/20/2016 -- we had prepared to use a Grafix skin substitute today but her wound had improved remarkably and we did not use this ===== Electronic Signature(s) Signed: 03/07/2017 11:54:07 AM By: Evlyn Kanner MD, FACS Entered By: Evlyn Kanner on 03/07/2017 11:38:39 [name] ([id number]) -------------------------------------------------------------------------------- Physical Exam Details [name] Date of Service: 03/07/2017 11:00 AM Medical Record Number: [id number] Patient Account Number: [id number] Date of Birth/Sex: [birth year] (71 y.o. Female) Treating RN: Curtis Sites Primary Care Provider: Milon Score Other Clinician: Referring Provider: Referral, Self Treating Provider/Extender: Rudene Re in Treatment: 2 Constitutional . Pulse regular. Respirations normal and unlabored. Afebrile. . Eyes Nonicteric. Reactive to light. Ears, Nose, Mouth, and Throat Lips, teeth, and gums WNL.Marland Kitchen Moist mucosa  without lesions. Neck supple and nontender. No palpable supraclavicular or cervical adenopathy. Normal sized without goiter. Respiratory WNL. No retractions.. Cardiovascular Pedal Pulses WNL. No clubbing, cyanosis but the patient has lymphedema of her right lower extremity. Lymphatic No adneopathy. No adenopathy. No adenopathy. Musculoskeletal Adexa without tenderness or enlargement.. Digits and nails w/o clubbing, cyanosis, infection, petechiae, ischemia, or inflammatory conditions.. Integumentary (Hair, Skin) No suspicious lesions. No crepitus or fluctuance. No peri-wound warmth or erythema. No masses.Marland Kitchen Psychiatric Judgement and insight Intact.. No evidence of depression, anxiety, or agitation.. Notes The patient has lymphedema of her right lower extremity. there are 2 spots at the 6 and 9:00 position which on gentle pressure exudate a bit of lymph. Other than that there is no cellulitis or inflammation Electronic Signature(s) Signed: 03/07/2017 11:54:07 AM By: Evlyn Kanner MD, FACS Entered By: Evlyn Kanner on 03/07/2017 11:40:02 TISHA, CLINE ([id number]) -------------------------------------------------------------------------------- Physician Orders Details [name] Date of Service: 03/07/2017 11:00 AM Medical Record Number: [id number] Patient Account Number: [id number] Date of Birth/Sex: 1945/09/11 (71 y.o. Female) Treating RN: Curtis Sites Primary Care Provider: Milon Score Other Clinician: Referring Provider: Referral, Self Treating Provider/Extender: Rudene Re in Treatment: 2 Verbal / Phone Orders: No Diagnosis Coding Wound Cleansing Wound #2 Right,Distal,Anterior Lower Leg o Clean wound with Normal Saline. o Cleanse wound with mild soap and water Wound #3 Right,Proximal,Anterior Lower Leg o Clean wound with Normal Saline. o Cleanse wound with mild soap and water Anesthetic Wound #2 Right,Distal,Anterior Lower Leg o  Topical Lidocaine 4% cream applied to wound bed prior to debridement Wound #3 Right,Proximal,Anterior Lower Leg o Topical Lidocaine 4% cream applied to wound bed prior to debridement Skin Barriers/Peri-Wound Care Wound #2 Right,Distal,Anterior Lower Leg o Skin Prep Wound #3 Right,Proximal,Anterior Lower Leg o Skin Prep Primary Wound Dressing Wound #2 Right,Distal,Anterior  Lower Leg o Foam Wound #3 Right,Proximal,Anterior Lower Leg o Foam Secondary Dressing Wound #2 Right,Distal,Anterior Lower Leg o Dry Gauze - bolster o Conform/Kerlix Wound #3 Right,Proximal,Anterior Lower Leg Lannom, Geraldene C. ([id number]) o Dry Gauze - bolster o Conform/Kerlix Dressing Change Frequency Wound #2 Right,Distal,Anterior Lower Leg o Change dressing every other day. Wound #3 Right,Proximal,Anterior Lower Leg o Change dressing every other day. Follow-up Appointments Wound #2 Right,Distal,Anterior Lower Leg o Return Appointment in 1 week. Wound #3 Right,Proximal,Anterior Lower Leg o Return Appointment in 1 week. Edema Control Wound #2 Right,Distal,Anterior Lower Leg o Support Garment 10-20 mm/Hg pressure to: Wound #3 Right,Proximal,Anterior Lower Leg o Support Garment 10-20 mm/Hg pressure to: Additional Orders / Instructions Wound #2 Right,Distal,Anterior Lower Leg o Increase protein intake. Wound #3 Right,Proximal,Anterior Lower Leg o Increase protein intake. Electronic Signature(s) Signed: 03/07/2017 11:54:07 AM By: Evlyn Kanner MD, FACS Signed: 03/07/2017 5:26:25 PM By: Curtis Sites Entered By: Curtis Sites on 03/07/2017 11:29:36 MACYN, SHROPSHIRE ([id number]) -------------------------------------------------------------------------------- Problem List Details [name] Date of Service: 03/07/2017 11:00 AM Medical Record Number: [id number] Patient Account Number: [id number] Date of Birth/Sex: [birth year] (71 y.o. 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 F17.218 Nicotine dependence, cigarettes, with other nicotine- 02/20/2017 Yes induced disorders I89.0 Lymphedema, not elsewhere classified 03/07/2017 Yes Inactive Problems Resolved Problems Electronic Signature(s) Signed: 03/07/2017 11:36:56 AM By: Evlyn Kanner MD, FACS Entered By: Evlyn Kanner on 03/07/2017 11:36:55 [name] ([id number]) -------------------------------------------------------------------------------- Progress Note Details [name] Date of Service: 03/07/2017 11:00 AM Medical Record Number: [id number] Patient Account Number: [id number] Date of Birth/Sex: 14-May-1946 (71 y.o. Female) Treating RN: Curtis Sites Primary Care Provider: Milon Score Other Clinician: Referring Provider: Referral, Self Treating Provider/Extender: Rudene Re in Treatment: 2 Subjective Chief Complaint Information obtained from Patient patient is here for follow-up evaluation of a right lower extremity ulcer History of Present Illness (HPI) The following HPI elements were documented for the patient's wound: Location: right lower extremity in the area of the lateral calf below the knee Quality: She is having no pain Context: The wound occurred when the patient a blunt injury and fall injury Modifying Factors: None as patient was healed Associated Signs and Symptoms: Patient reports having increase discharge. Readmission: 02/20/17 on evaluation today patient's wound which had previously healed in June 2018 unfortunately had two areas that began to drain in the past  week. She therefore called to be reevaluated in regard to this wound. At the location of the healed wound server she has two panel locations one at 9 o'clock and one at 6 o'clock it appeared to be draining fluid and it seems that she did not fully heal underneath the wound as this close. This therefore trapped fluid and now has reopened. Fortunately it seems to be mild and I think this will likely close and heal very quickly although obviously this is frustrating for the patient and she thought that she was done with this wound. No fevers, chills, nausea, or vomiting noted at this time. Patient is having no pain. 02/27/17 on evaluation today patient's right lower extremity ulcer appears to be doing about the same. We have not made a lot of improvements. Fortunately we did  get the results of her culture back which showed no growth this was good news. Again I was not overly convinced that this would be infected but nonetheless we wanted to be sure just considering how it was progressing. Fortunately she has no significant pain other than with dressing changes and then it's around a two out of 10. No fevers, chills, nausea, or vomiting noted at this time. 03/07/2017 -- this patient was discharged on 12/27/2016 has not been wearing her compression stockings as advised but has been wearing TED hose which are basically useless for a problem. He continues to smoke about half pack of cigarettes a day ======== Old Notes 71 year old patient has been referred to as with right lower extremity wound with edema and ecchymosis, after having a injury due to a fall on 08/20/2016. she has a history of diabetes mellitus type 2 which is [place], Jacaria C. ([id number]) uncomplicated, chronic back pain, COPD, hypertension, hypothyroidism, supraventricular tachycardia and tobacco abuse. He is status post neck surgery, appendectomy, cholecystectomy, hernia repair. He smokes about half a pack of cigarettes a day for the  last 50 years. Recently on doxycycline for 10 days and was given symptomatic treatment for wound. 10/04/2016 -- the patient tolerated the snap vacuum system well and had no significant problems. She continues to smoke and is trying to give it up. 10/11/2016 -- she continues to tolerate the snap like him system fairly well and other than that she has no fresh issues 11/01/16- she is here for follow-up evaluation of her right lower extremity ulcer. She is tolerating the SNAP negative pressure system. She is voicing no complaints of pain. She states her glucose levels are consistently less than 150 with her evening glucose levels being higher than morning glucose levels. She continues to smoke, approximately 0.5-ppd which is a reduction of 1-ppd approximately 6 months ago. 11/08/16 patient on evaluation today fortunately does not appear to be having as much discomfort as she has in the past. She is tolerating the dressing changes well although she does note some irritation where the wound VAC has been applied. She specifically wonders if we could see about taking a break possibly progressing to discontinuation of the wound VAC. There is no evidence of infection. 12/20/2016 -- we had prepared to use a Grafix skin substitute today but her wound had improved remarkably and we did not use this ===== Objective Constitutional Pulse regular. Respirations normal and unlabored. Afebrile. Vitals Time Taken: 11:04 AM, Height: 67 in, Weight: 202 lbs, BMI: 31.6, Temperature: 98.4 F, Pulse: 69 bpm, Respiratory Rate: 18 breaths/min, Blood Pressure: 155/67 mmHg. Eyes Nonicteric. Reactive to light. Ears, Nose, Mouth, and Throat Lips, teeth, and gums WNL.Marland Kitchen Moist mucosa without lesions. Neck supple and nontender. No palpable supraclavicular or cervical adenopathy. Normal sized without goiter. [name], [name] C. ([id number]) Respiratory WNL. No retractions.. Cardiovascular Pedal Pulses WNL. No clubbing,  cyanosis but the patient has lymphedema of her right lower extremity. Lymphatic No adneopathy. No adenopathy. No adenopathy. Musculoskeletal Adexa without tenderness or enlargement.. Digits and nails w/o clubbing, cyanosis, infection, petechiae, ischemia, or inflammatory conditions.Marland Kitchen Psychiatric Judgement and insight Intact.. No evidence of depression, anxiety, or agitation.. General Notes: The patient has lymphedema of her right lower extremity. there are 2 spots at the 6 and 9:00 position which on gentle pressure exudate a bit of lymph. Other than that there is no cellulitis or inflammation Integumentary (Hair, Skin) No suspicious lesions. No crepitus or fluctuance. No peri-wound warmth or erythema. No masses.. Wound #  2 status is Open. Original cause of wound was Gradually Appeared. The wound is located on the Right,Distal,Anterior Lower Leg. The wound measures 0.1cm length x 0.1cm width x 0.1cm depth; 0.008cm^2 area and 0.001cm^3 volume. There is no tunneling or undermining noted. There is a large amount of serosanguineous drainage noted. The wound margin is distinct with the outline attached to the wound base. There is large (67-100%) granulation within the wound bed. There is no necrotic tissue within the wound bed. The periwound skin appearance did not exhibit: Callus, Crepitus, Excoriation, Induration, Rash, Scarring, Dry/Scaly, Maceration, Atrophie Blanche, Cyanosis, Ecchymosis, Hemosiderin Staining, Mottled, Pallor, Rubor, Erythema. Periwound temperature was noted as No Abnormality. The periwound has tenderness on palpation. Wound #3 status is Open. Original cause of wound was Gradually Appeared. The wound is located on the Right,Proximal,Anterior Lower Leg. The wound measures 0.2cm length x 0.1cm width x 0.1cm depth; 0.016cm^2 area and 0.002cm^3 volume. There is no tunneling or undermining noted. There is a large amount of purulent drainage noted. The wound margin is distinct with  the outline attached to the wound base. There is large (67-100%) red granulation within the wound bed. There is no necrotic tissue within the wound bed. The periwound skin appearance did not exhibit: Callus, Crepitus, Excoriation, Induration, Rash, Scarring, Dry/Scaly, Maceration, Atrophie Blanche, Cyanosis, Ecchymosis, Hemosiderin Staining, Mottled, Pallor, Rubor, Erythema. Periwound temperature was noted as No Abnormality. The periwound has tenderness on palpation. Assessment KAELENE, ELLISTON ([id number]) Active Problems ICD-10 E11.622 - Type 2 diabetes mellitus with other skin ulcer L97.212 - Non-pressure chronic ulcer of right calf with fat layer exposed F17.218 - Nicotine dependence, cigarettes, with other nicotine-induced disorders I89.0 - Lymphedema, not elsewhere classified Plan Wound Cleansing: Wound #2 Right,Distal,Anterior Lower Leg: Clean wound with Normal Saline. Cleanse wound with mild soap and water Wound #3 Right,Proximal,Anterior Lower Leg: Clean wound with Normal Saline. Cleanse wound with mild soap and water Anesthetic: Wound #2 Right,Distal,Anterior Lower Leg: Topical Lidocaine 4% cream applied to wound bed prior to debridement Wound #3 Right,Proximal,Anterior Lower Leg: Topical Lidocaine 4% cream applied to wound bed prior to debridement Skin Barriers/Peri-Wound Care: Wound #2 Right,Distal,Anterior Lower Leg: Skin Prep Wound #3 Right,Proximal,Anterior Lower Leg: Skin Prep Primary Wound Dressing: Wound #2 Right,Distal,Anterior Lower Leg: Foam Wound #3 Right,Proximal,Anterior Lower Leg: Foam Secondary Dressing: Wound #2 Right,Distal,Anterior Lower Leg: Dry Gauze - bolster Conform/Kerlix Wound #3 Right,Proximal,Anterior Lower Leg: Dry Gauze - bolster Conform/Kerlix Dressing Change Frequency: Wound #2 Right,Distal,Anterior Lower Leg: Change dressing every other day. Wound #3 Right,Proximal,Anterior Lower Leg: Change dressing every other day. Follow-up  Appointments: JOSIAH, WOJTASZEK ([id number]) Wound #2 Right,Distal,Anterior Lower Leg: Return Appointment in 1 week. Wound #3 Right,Proximal,Anterior Lower Leg: Return Appointment in 1 week. Edema Control: Wound #2 Right,Distal,Anterior Lower Leg: Support Garment 10-20 mm/Hg pressure to: Wound #3 Right,Proximal,Anterior Lower Leg: Support Garment 10-20 mm/Hg pressure to: Additional Orders / Instructions: Wound #2 Right,Distal,Anterior Lower Leg: Increase protein intake. Wound #3 Right,Proximal,Anterior Lower Leg: Increase protein intake. after review today I have recommended: 1. Elevation and exercise 2. Compression stockings of the 20-30 mm variety to be worn every day according to a strict schedule 3. I a form and a Kerlix and Coban dressing over that area to have firm compression and obliterate the dead space in this region 4. I have again discussed with her the need to completely give up smoking and have urged her to be compliant 5. I will see her back next week Electronic Signature(s) Signed: 03/07/2017 11:54:07 AM By: Evlyn Kanner MD,  FACS Entered By: Evlyn Kanner on 03/07/2017 11:41:32 [name] ([id number]) -------------------------------------------------------------------------------- SuperBill Details [name] Date of Service: 03/07/2017 Medical Record Number: [id number] Patient Account Number: [id number] Date of Birth/Sex: 31-Oct-1945 (71 y.o. Female) Treating RN: Curtis Sites Primary Care Provider: Milon Score Other Clinician: Referring Provider: Referral, Self Treating Provider/Extender: Rudene Re in Treatment: 2 Diagnosis Coding ICD-10 Codes Code Description E11.622 Type 2 diabetes mellitus with other skin ulcer L97.212 Non-pressure chronic ulcer of right calf with fat layer exposed F17.218 Nicotine dependence, cigarettes, with other nicotine-induced disorders I89.0 Lymphedema, not elsewhere classified Facility  Procedures CPT4 Code: [id number] Description: 99213 - WOUND CARE VISIT-LEV 3 EST PT Modifier: Quantity: 1 Physician Procedures CPT4 Code Description: [id number] 99213 - WC PHYS LEVEL 3 - EST PT ICD-10 Description Diagnosis E11.622 Type 2 diabetes mellitus with other skin ulcer L97.212 Non-pressure chronic ulcer of right calf with fat l F17.218 Nicotine dependence, cigarettes, with  other nicotin I89.0 Lymphedema, not elsewhere classified Modifier: ayer exposed e-induced di Quantity: 1 sorders Electronic Signature(s) Signed: 03/07/2017 1:18:32 PM By: Curtis Sites Signed: 03/07/2017 3:48:30 PM By: Evlyn Kanner MD, FACS Previous Signature: 03/07/2017 11:54:07 AM Version By: Evlyn Kanner MD, FACS Entered By: Curtis Sites on 03/07/2017 13:18:31

## 2017-03-10 NOTE — Progress Notes (Signed)
[name], [name] ([id number]) Visit Report for 03/07/2017 Arrival Information Details [name] Date of Service: 03/07/2017 11:00 AM Medical Record Number: [id number] Patient Account Number: [id number] Date of Birth/Sex: 05/16/1946 (70 y.o. Female) Treating RN: Curtis Sites Primary Care Jersey Espinoza: Milon Score Other Clinician: Referring Makhai Fulco: Referral, Self Treating Rhyse Loux/Extender: Rudene Re in Treatment: 2 Visit Information History Since Last Visit Added or deleted any medications: No Patient Arrived: Ambulatory Any new allergies or adverse reactions: No Arrival Time: 11:04 Had a fall or experienced change in No Accompanied By: self activities of daily living that may affect Transfer Assistance: None risk of falls: Patient Identification Verified: Yes Signs or symptoms of abuse/neglect since last No Secondary Verification Process Yes visito Completed: Hospitalized since last visit: No Patient Requires Transmission-Based No Has Dressing in Place as Prescribed: Yes Precautions: Pain Present Now: No Patient Has Alerts: Yes Patient Alerts: DM II Electronic Signature(s) Signed: 03/07/2017 5:26:25 PM By: Curtis Sites Entered By: Curtis Sites on 03/07/2017 11:04:18 [name] ([id number]) -------------------------------------------------------------------------------- Clinic Level of Care Assessment Details [name] Date of Service: 03/07/2017 11:00 AM Medical Record Number: [id number] Patient Account Number: [id number] Date of Birth/Sex: [birth year] (70 y.o. Female) Treating RN: Curtis Sites Primary Care Taydon Nasworthy: Milon Score Other Clinician: Referring Mareena Cavan: Referral, Self Treating Stetson Pelaez/Extender: Rudene Re in Treatment: 2 Clinic Level of Care Assessment Items TOOL 4 Quantity Score []  - Use when only an EandM is performed on FOLLOW-UP visit 0 ASSESSMENTS - Nursing Assessment /  Reassessment X - Reassessment of Co-morbidities (includes updates in patient status) 1 10 X - Reassessment of Adherence to Treatment Plan 1 5 ASSESSMENTS - Wound and Skin Assessment / Reassessment []  - Simple Wound Assessment / Reassessment - one wound 0 X - Complex Wound Assessment / Reassessment - multiple wounds 2 5 []  - Dermatologic / Skin Assessment (not related to wound area) 0 ASSESSMENTS - Focused Assessment []  - Circumferential Edema Measurements - multi extremities 0 []  - Nutritional Assessment / Counseling / Intervention 0 X - Lower Extremity Assessment (monofilament, tuning fork, pulses) 1 5 []  - Peripheral Arterial Disease Assessment (using hand held doppler) 0 ASSESSMENTS - Ostomy and/or Continence Assessment and Care []  - Incontinence Assessment and Management 0 []  - Ostomy Care Assessment and Management (repouching, etc.) 0 PROCESS - Coordination of Care X - Simple Patient / Family Education for ongoing care 1 15 []  - Complex (extensive) Patient / Family Education for ongoing care 0 []  - Staff obtains Chiropractor, Records, Test Results / Process Orders 0 []  - Staff telephones HHA, Nursing Homes / Clarify orders / etc 0 []  - Routine Transfer to another Facility (non-emergent condition) 0 [name], [name] ([id number]) []  - Routine Hospital Admission (non-emergent condition) 0 []  - New Admissions / Manufacturing engineer / Ordering NPWT, Apligraf, etc. 0 []  - Emergency Hospital Admission (emergent condition) 0 X - Simple Discharge Coordination 1 10 []  - Complex (extensive) Discharge Coordination 0 PROCESS - Special Needs []  - Pediatric / Minor Patient Management 0 []  - Isolation Patient Management 0 []  - Hearing / Language / Visual special needs 0 []  - Assessment of Community assistance (transportation, D/C planning, etc.) 0 []  - Additional assistance / Altered mentation 0 []  - Support Surface(s) Assessment (bed, cushion, seat, etc.) 0 INTERVENTIONS - Wound Cleansing /  Measurement []  - Simple Wound Cleansing - one wound 0 X - Complex Wound Cleansing - multiple wounds 2 5 X - Wound Imaging (photographs - any number of wounds)  1 5 []  - Wound Tracing (instead of photographs) 0 []  - Simple Wound Measurement - one wound 0 X - Complex Wound Measurement - multiple wounds 2 5 INTERVENTIONS - Wound Dressings X - Small Wound Dressing one or multiple wounds 2 10 []  - Medium Wound Dressing one or multiple wounds 0 []  - Large Wound Dressing one or multiple wounds 0 []  - Application of Medications - topical 0 []  - Application of Medications - injection 0 INTERVENTIONS - Miscellaneous []  - External ear exam 0 [name], [name] ([id number]) []  - Specimen Collection (cultures, biopsies, blood, body fluids, etc.) 0 []  - Specimen(s) / Culture(s) sent or taken to Lab for analysis 0 []  - Patient Transfer (multiple staff / Michiel Sites Lift / Similar devices) 0 []  - Simple Staple / Suture removal (25 or less) 0 []  - Complex Staple / Suture removal (26 or more) 0 []  - Hypo / Hyperglycemic Management (close monitor of Blood Glucose) 0 []  - Ankle / Brachial Index (ABI) - do not check if billed separately 0 X - Vital Signs 1 5 Has the patient been seen at the hospital within the last three years: Yes Total Score: 105 Level Of Care: New/Established - Level 3 Electronic Signature(s) Signed: 03/07/2017 5:26:25 PM By: Curtis Sites Entered By: Curtis Sites on 03/07/2017 13:18:24 [name] ([id number]) -------------------------------------------------------------------------------- Encounter Discharge Information Details [name] Date of Service: 03/07/2017 11:00 AM Medical Record Number: [id number] Patient Account Number: [id number] Date of Birth/Sex: April 16, 1946 (70 y.o. Female) Treating RN: Curtis Sites Primary Care Lakhia Gengler: Milon Score Other Clinician: Referring Jamarr Treinen: Referral, Self Treating Axavier Pressley/Extender: Rudene Re in  Treatment: 2 Encounter Discharge Information Items Discharge Pain Level: 0 Discharge Condition: Stable Ambulatory Status: Ambulatory Discharge Destination: Home Transportation: Private Auto Accompanied By: self Schedule Follow-up Appointment: Yes Medication Reconciliation completed and provided to Patient/Care No Lajuanda Penick: Provided on Clinical Summary of Care: 03/07/2017 Form Type Recipient Paper Patient ML Electronic Signature(s) Signed: 03/10/2017 9:40:42 AM By: Gwenlyn Perking Entered By: Gwenlyn Perking on 03/07/2017 11:40:17 [name] ([id number]) -------------------------------------------------------------------------------- Lower Extremity Assessment Details [name] Date of Service: 03/07/2017 11:00 AM Medical Record Number: [id number] Patient Account Number: [id number] Date of Birth/Sex: December 22, 1945 (70 y.o. Female) Treating RN: Curtis Sites Primary Care Stephaney Steven: Milon Score Other Clinician: Referring Shaynna Husby: Referral, Self Treating Mayre Bury/Extender: Rudene Re in Treatment: 2 Vascular Assessment Pulses: Dorsalis Pedis Palpable: [Right:Yes] Posterior Tibial Extremity colors, hair growth, and conditions: Extremity Color: [Right:Normal] Hair Growth on Extremity: [Right:Yes] Temperature of Extremity: [Right:Warm] Capillary Refill: [Right:< 3 seconds] Electronic Signature(s) Signed: 03/07/2017 5:26:25 PM By: Curtis Sites Entered By: Curtis Sites on 03/07/2017 11:16:28 [name] ([id number]) -------------------------------------------------------------------------------- Multi Wound Chart Details [name] Date of Service: 03/07/2017 11:00 AM Medical Record Number: [id number] Patient Account Number: [id number] Date of Birth/Sex: April 02, 1946 (70 y.o. Female) Treating RN: Curtis Sites Primary Care Latricia Cerrito: Milon Score Other Clinician: Referring Braylynn Ghan: Referral, Self Treating  Felesia Stahlecker/Extender: Rudene Re in Treatment: 2 Vital Signs Height(in): 67 Pulse(bpm): 69 Weight(lbs): 202 Blood Pressure 155/67 (mmHg): Body Mass Index(BMI): 32 Temperature(F): 98.4 Respiratory Rate 18 (breaths/min): Photos: [2:No Photos] [3:No Photos] [N/A:N/A] Wound Location: [2:Right Lower Leg - Anterior, Distal] [3:Right Lower Leg - Anterior, Proximal] [N/A:N/A] Wounding Event: [2:Gradually Appeared] [3:Gradually Appeared] [N/A:N/A] Primary Etiology: [2:Diabetic Wound/Ulcer of the Lower Extremity] [3:Diabetic Wound/Ulcer of the Lower Extremity] [N/A:N/A] Comorbid History: [2:Chronic Obstructive Pulmonary Disease (COPD), Hypertension, Type II Diabetes] [3:Chronic Obstructive Pulmonary Disease (COPD), Hypertension, Type II Diabetes] [N/A:N/A]  Date Acquired: [2:02/14/2017] [3:02/14/2017] [N/A:N/A] Weeks of Treatment: [2:2] [3:2] [N/A:N/A] Wound Status: [2:Open] [3:Open] [N/A:N/A] Measurements L x W x D 0.1x0.1x0.1 [3:0.2x0.1x0.1] [N/A:N/A] (cm) Area (cm) : [2:0.008] [3:0.016] [N/A:N/A] Volume (cm) : [2:0.001] [3:0.002] [N/A:N/A] % Reduction in Area: [2:50.00%] [3:0.00%] [N/A:N/A] % Reduction in Volume: 66.70% [3:0.00%] [N/A:N/A] Classification: [2:Grade 1] [3:Grade 1] [N/A:N/A] Exudate Amount: [2:Large] [3:Large] [N/A:N/A] Exudate Type: [2:Serosanguineous] [3:Purulent] [N/A:N/A] Exudate Color: [2:red, brown] [3:yellow, brown, green] [N/A:N/A] Wound Margin: [2:Distinct, outline attached] [3:Distinct, outline attached] [N/A:N/A] Granulation Amount: [2:Large (67-100%)] [3:Large (67-100%)] [N/A:N/A] Granulation Quality: [2:N/A] [3:Red] [N/A:N/A] Necrotic Amount: [2:None Present (0%)] [3:None Present (0%)] [N/A:N/A] Epithelialization: [2:None] [3:None] [N/A:N/A] Periwound Skin Texture: Excoriation: No [2:Induration: No] [3:Excoriation: No Induration: No] [N/A:N/A] Callus: No Callus: No Crepitus: No Crepitus: No Rash: No Rash: No Scarring: No Scarring: No Periwound  Skin Maceration: No Maceration: No N/A Moisture: Dry/Scaly: No Dry/Scaly: No Periwound Skin Color: Atrophie Blanche: No Atrophie Blanche: No N/A Cyanosis: No Cyanosis: No Ecchymosis: No Ecchymosis: No Erythema: No Erythema: No Hemosiderin Staining: No Hemosiderin Staining: No Mottled: No Mottled: No Pallor: No Pallor: No Rubor: No Rubor: No Temperature: No Abnormality No Abnormality N/A Tenderness on Yes Yes N/A Palpation: Wound Preparation: Ulcer Cleansing: Ulcer Cleansing: N/A Rinsed/Irrigated with Rinsed/Irrigated with Saline Saline Topical Anesthetic Topical Anesthetic Applied: Other: lidocaine4 Applied: Other: lidocaine % 4% Treatment Notes Electronic Signature(s) Signed: 03/07/2017 11:54:07 AM By: Evlyn Kanner MD, FACS Entered By: Evlyn Kanner on 03/07/2017 11:37:00 [name], [name] ([id number]) -------------------------------------------------------------------------------- Multi-Disciplinary Care Plan Details [name] Date of Service: 03/07/2017 11:00 AM Medical Record Number: [id number] Patient Account Number: [id number] Date of Birth/Sex: [birth year] (70 y.o. Female) Treating RN: Curtis Sites Primary Care Arihanna Estabrook: Milon Score Other Clinician: Referring Roy Tokarz: Referral, Self Treating Helton Oleson/Extender: Rudene Re in Treatment: 2 Active Inactive ` Orientation to the Wound Care Program Nursing Diagnoses: Knowledge deficit related to the wound healing center program Goals: Patient/caregiver will verbalize understanding of the Wound Healing Center Program Date Initiated: 02/20/2017 Target Resolution Date: 03/22/2017 Goal Status: Active Interventions: Provide education on orientation to the wound center Notes: ` Pain, Acute or Chronic Nursing Diagnoses: Pain, acute or chronic: actual or potential Potential alteration in comfort, pain Goals: Patient/caregiver will verbalize adequate pain control between visits Date  Initiated: 02/20/2017 Target Resolution Date: 05/24/2017 Goal Status: Active Interventions: Complete pain assessment as per visit requirements Notes: ` Wound/Skin Impairment Nursing Diagnoses: Impaired tissue integrity [name], [name] ([id number]) Knowledge deficit related to ulceration/compromised skin integrity Goals: Ulcer/skin breakdown will have a volume reduction of 80% by week 12 Date Initiated: 02/20/2017 Target Resolution Date: 06/14/2017 Goal Status: Active Interventions: Assess patient/caregiver ability to perform ulcer/skin care regimen upon admission and as needed Assess ulceration(s) every visit Notes: Electronic Signature(s) Signed: 03/07/2017 5:26:25 PM By: Curtis Sites Entered By: Curtis Sites on 03/07/2017 11:16:34 [name] ([id number]) -------------------------------------------------------------------------------- Pain Assessment Details [name] Date of Service: 03/07/2017 11:00 AM Medical Record Number: [id number] Patient Account Number: [id number] Date of Birth/Sex: 1945-10-02 (70 y.o. Female) Treating RN: Curtis Sites Primary Care Ilona Colley: Milon Score Other Clinician: Referring Mosie Angus: Referral, Self Treating Heather Streeper/Extender: Rudene Re in Treatment: 2 Active Problems Location of Pain Severity and Description of Pain Patient Has Paino No Site Locations Pain Management and Medication Current Pain Management: Notes Topical or injectable lidocaine is offered to patient for acute pain when surgical debridement is performed. If needed, Patient is instructed to use over the counter pain medication for the following 24-48 hours after debridement. Wound care  MDs do not prescribed pain medications. Patient has chronic pain or uncontrolled pain. Patient has been instructed to make an appointment with their Primary Care Physician for pain management. Electronic Signature(s) Signed: 03/07/2017 5:26:25 PM By: Curtis Sites Entered By: Curtis Sites on 03/07/2017 11:04:28 [name] ([id number]) -------------------------------------------------------------------------------- Patient/Caregiver Education Details [name] Date of Service: 03/07/2017 11:00 AM Medical Record Number: [id number] Patient Account Number: [id number] Date of Birth/Gender: 1946-06-12 (70 y.o. Female) Treating RN: Curtis Sites Primary Care Physician: Milon Score Other Clinician: Referring Physician: Referral, Self Treating Physician/Extender: Rudene Re in Treatment: 2 Education Assessment Education Provided To: Patient Education Topics Provided Venous: Handouts: Other: wear compression hose Methods: Explain/Verbal Responses: State content correctly Electronic Signature(s) Signed: 03/07/2017 5:26:25 PM By: Curtis Sites Entered By: Curtis Sites on 03/07/2017 11:22:21 [name] ([id number]) -------------------------------------------------------------------------------- Wound Assessment Details [name] Date of Service: 03/07/2017 11:00 AM Medical Record Number: [id number] Patient Account Number: [id number] Date of Birth/Sex: February 15, 1946 (70 y.o. Female) Treating RN: Curtis Sites Primary Care Shameika Speelman: Milon Score Other Clinician: Referring Denisha Hoel: Referral, Self Treating Bryam Taborda/Extender: Rudene Re in Treatment: 2 Wound Status Wound Number: 2 Primary Diabetic Wound/Ulcer of the Lower Etiology: Extremity Wound Location: Right Lower Leg - Anterior, Distal Wound Open Status: Wounding Event: Gradually Appeared Comorbid Chronic Obstructive Pulmonary Date Acquired: 02/14/2017 History: Disease (COPD), Hypertension, Type Weeks Of Treatment: 2 II Diabetes Clustered Wound: No Photos Photo Uploaded By: Curtis Sites on 03/07/2017 12:27:28 Wound Measurements Length: (cm) 0.1 Width: (cm) 0.1 Depth: (cm) 0.1 Area: (cm) 0.008 Volume:  (cm) 0.001 % Reduction in Area: 50% % Reduction in Volume: 66.7% Epithelialization: None Tunneling: No Undermining: No Wound Description Classification: Grade 1 Foul Odor Aft Wound Margin: Distinct, outline attached Slough/Fibrin Exudate Amount: Large Exudate Type: Serosanguineous Exudate Color: red, brown er Cleansing: No o No Wound Bed Granulation Amount: Large (67-100%) Necrotic Amount: None Present (0%) Periwound Skin Texture Wedge, Giavana C. ([id number]) Texture Color No Abnormalities Noted: No No Abnormalities Noted: No Callus: No Atrophie Blanche: No Crepitus: No Cyanosis: No Excoriation: No Ecchymosis: No Induration: No Erythema: No Rash: No Hemosiderin Staining: No Scarring: No Mottled: No Pallor: No Moisture Rubor: No No Abnormalities Noted: No Dry / Scaly: No Temperature / Pain Maceration: No Temperature: No Abnormality Tenderness on Palpation: Yes Wound Preparation Ulcer Cleansing: Rinsed/Irrigated with Saline Topical Anesthetic Applied: Other: lidocaine4 %, Treatment Notes Wound #2 (Right, Distal, Anterior Lower Leg) 1. Cleansed with: Clean wound with Normal Saline 2. Anesthetic Topical Lidocaine 4% cream to wound bed prior to debridement 4. Dressing Applied: Dry Gauze Foam 5. Secondary Dressing Applied Kerlix/Conform 7. Secured with Secretary/administrator) Signed: 03/07/2017 5:26:25 PM By: Curtis Sites Entered By: Curtis Sites on 03/07/2017 11:15:11 [name] ([id number]) -------------------------------------------------------------------------------- Wound Assessment Details [name] Date of Service: 03/07/2017 11:00 AM Medical Record Number: [id number] Patient Account Number: [id number] Date of Birth/Sex: Jan 02, 1946 (70 y.o. Female) Treating RN: Curtis Sites Primary Care Marquelle Balow: Milon Score Other Clinician: Referring Quintana Canelo: Referral, Self Treating Ger Ringenberg/Extender: Rudene Re  in Treatment: 2 Wound Status Wound Number: 3 Primary Diabetic Wound/Ulcer of the Lower Etiology: Extremity Wound Location: Right Lower Leg - Anterior, Proximal Wound Open Status: Wounding Event: Gradually Appeared Comorbid Chronic Obstructive Pulmonary Date Acquired: 02/14/2017 History: Disease (COPD), Hypertension, Type Weeks Of Treatment: 2 II Diabetes Clustered Wound: No Photos Photo Uploaded By: Curtis Sites on 03/07/2017 12:27:29 Wound Measurements Length: (cm) 0.2 Width: (cm) 0.1 Depth: (cm) 0.1 Area: (cm) 0.016  Volume: (cm) 0.002 % Reduction in Area: 0% % Reduction in Volume: 0% Epithelialization: None Tunneling: No Undermining: No Wound Description Classification: Grade 1 Foul Odor Aft Wound Margin: Distinct, outline attached Slough/Fibrin Exudate Amount: Large Exudate Type: Purulent Exudate Color: yellow, brown, green er Cleansing: No o No Wound Bed Granulation Amount: Large (67-100%) Granulation Quality: Red Necrotic Amount: None Present (0%) [name], [name] ([id number]) Periwound Skin Texture Texture Color No Abnormalities Noted: No No Abnormalities Noted: No Callus: No Atrophie Blanche: No Crepitus: No Cyanosis: No Excoriation: No Ecchymosis: No Induration: No Erythema: No Rash: No Hemosiderin Staining: No Scarring: No Mottled: No Pallor: No Moisture Rubor: No No Abnormalities Noted: No Dry / Scaly: No Temperature / Pain Maceration: No Temperature: No Abnormality Tenderness on Palpation: Yes Wound Preparation Ulcer Cleansing: Rinsed/Irrigated with Saline Topical Anesthetic Applied: Other: lidocaine 4%, Treatment Notes Wound #3 (Right, Proximal, Anterior Lower Leg) 1. Cleansed with: Clean wound with Normal Saline 2. Anesthetic Topical Lidocaine 4% cream to wound bed prior to debridement 4. Dressing Applied: Dry Gauze Foam 5. Secondary Dressing Applied Kerlix/Conform 7. Secured with Secretary/administrator) Signed:  03/07/2017 5:26:25 PM By: Curtis Sites Entered By: Curtis Sites on 03/07/2017 11:15:36 [name], [name] ([id number]) -------------------------------------------------------------------------------- Vitals Details [name] Date of Service: 03/07/2017 11:00 AM Medical Record Number: [id number] Patient Account Number: [id number] Date of Birth/Sex: 1945/12/28 (71 y.o. Female) Treating RN: Curtis Sites Primary Care Brycen Bean: Milon Score Other Clinician: Referring Destinie Thornsberry: Referral, Self Treating Tashema Tiller/Extender: Rudene Re in Treatment: 2 Vital Signs Time Taken: 11:04 Temperature (F): 98.4 Height (in): 67 Pulse (bpm): 69 Weight (lbs): 202 Respiratory Rate (breaths/min): 18 Body Mass Index (BMI): 31.6 Blood Pressure (mmHg): 155/67 Reference Range: 80 - 120 mg / dl Electronic Signature(s) Signed: 03/07/2017 5:26:25 PM By: Curtis Sites Entered By: Curtis Sites on 03/07/2017 11:07:53

## 2017-03-13 ENCOUNTER — Encounter: Payer: Medicare Other | Admitting: Surgery

## 2017-03-13 DIAGNOSIS — E11622 Type 2 diabetes mellitus with other skin ulcer: Secondary | ICD-10-CM | POA: Diagnosis not present

## 2017-03-19 NOTE — Progress Notes (Signed)
Janet, [name] ([id number]) Visit Report for 03/13/2017 Arrival Information Details [name], [name] Date of Service: 03/13/2017 11:15 AM Medical Record Number: [id number] Patient Account Number: [id number] Date of Birth/Sex: 07-05-1946 (71 y.o. Female) Treating RN: Huel Coventry Primary Care Aleta Manternach: Milon Score Other Clinician: Referring Lynda Wanninger: Referral, Self Treating Ronda Kazmi/Extender: Rudene Re in Treatment: 3 Visit Information History Since Last Visit Added or deleted any medications: No Patient Arrived: Ambulatory Any new allergies or adverse reactions: No Arrival Time: 11:06 Had a fall or experienced change in No Accompanied By: self activities of daily living that may affect Transfer Assistance: None risk of falls: Patient Identification Verified: Yes Signs or symptoms of abuse/neglect since last No Secondary Verification Process Yes visito Completed: Hospitalized since last visit: No Patient Requires Transmission-Based No Has Dressing in Place as Prescribed: Yes Precautions: Pain Present Now: No Patient Has Alerts: Yes Patient Alerts: DM II Electronic Signature(s) Signed: 03/18/2017 5:20:16 PM By: Elliot Gurney, BSN, RN, CWS, Kim RN, BSN Entered By: Elliot Gurney, BSN, RN, CWS, Kim on 03/13/2017 11:06:59 [name] ([id number]) -------------------------------------------------------------------------------- Clinic Level of Care Assessment Details [name] Date of Service: 03/13/2017 11:15 AM Medical Record Number: [id number] Patient Account Number: [id number] Date of Birth/Sex: 10-14-1945 (71 y.o. Female) Treating RN: Huel Coventry Primary Care Chantale Leugers: Milon Score Other Clinician: Referring Linard Daft: Referral, Self Treating Campbell Kray/Extender: Rudene Re in Treatment: 3 Clinic Level of Care Assessment Items TOOL 4 Quantity Score []  - Use when only an EandM is performed on FOLLOW-UP visit 0 ASSESSMENTS - Nursing  Assessment / Reassessment X - Reassessment of Co-morbidities (includes updates in patient status) 1 10 X - Reassessment of Adherence to Treatment Plan 1 5 ASSESSMENTS - Wound and Skin Assessment / Reassessment X - Simple Wound Assessment / Reassessment - one wound 1 5 []  - Complex Wound Assessment / Reassessment - multiple wounds 0 []  - Dermatologic / Skin Assessment (not related to wound area) 0 ASSESSMENTS - Focused Assessment []  - Circumferential Edema Measurements - multi extremities 0 []  - Nutritional Assessment / Counseling / Intervention 0 []  - Lower Extremity Assessment (monofilament, tuning fork, pulses) 0 []  - Peripheral Arterial Disease Assessment (using hand held doppler) 0 ASSESSMENTS - Ostomy and/or Continence Assessment and Care []  - Incontinence Assessment and Management 0 []  - Ostomy Care Assessment and Management (repouching, etc.) 0 PROCESS - Coordination of Care X - Simple Patient / Family Education for ongoing care 1 15 []  - Complex (extensive) Patient / Family Education for ongoing care 0 []  - Staff obtains Chiropractor, Records, Test Results / Process Orders 0 []  - Staff telephones HHA, Nursing Homes / Clarify orders / etc 0 []  - Routine Transfer to another Facility (non-emergent condition) 0 Hosman, Feige C. ([id number]) []  - Routine Hospital Admission (non-emergent condition) 0 []  - New Admissions / Manufacturing engineer / Ordering NPWT, Apligraf, etc. 0 []  - Emergency Hospital Admission (emergent condition) 0 X - Simple Discharge Coordination 1 10 []  - Complex (extensive) Discharge Coordination 0 PROCESS - Special Needs []  - Pediatric / Minor Patient Management 0 []  - Isolation Patient Management 0 []  - Hearing / Language / Visual special needs 0 []  - Assessment of Community assistance (transportation, D/C planning, etc.) 0 []  - Additional assistance / Altered mentation 0 []  - Support Surface(s) Assessment (bed, cushion, seat, etc.) 0 INTERVENTIONS - Wound  Cleansing / Measurement X - Simple Wound Cleansing - one wound 1 5 []  - Complex Wound Cleansing - multiple wounds 0 X - Wound  Imaging (photographs - any number of wounds) 1 5 []  - Wound Tracing (instead of photographs) 0 X - Simple Wound Measurement - one wound 1 5 []  - Complex Wound Measurement - multiple wounds 0 INTERVENTIONS - Wound Dressings []  - Small Wound Dressing one or multiple wounds 0 X - Medium Wound Dressing one or multiple wounds 1 15 []  - Large Wound Dressing one or multiple wounds 0 []  - Application of Medications - topical 0 []  - Application of Medications - injection 0 INTERVENTIONS - Miscellaneous []  - External ear exam 0 Grothe, Aalia C. ([id number]) []  - Specimen Collection (cultures, biopsies, blood, body fluids, etc.) 0 []  - Specimen(s) / Culture(s) sent or taken to Lab for analysis 0 []  - Patient Transfer (multiple staff / Michiel Sites Lift / Similar devices) 0 []  - Simple Staple / Suture removal (25 or less) 0 []  - Complex Staple / Suture removal (26 or more) 0 []  - Hypo / Hyperglycemic Management (close monitor of Blood Glucose) 0 []  - Ankle / Brachial Index (ABI) - do not check if billed separately 0 X - Vital Signs 1 5 Has the patient been seen at the hospital within the last three years: Yes Total Score: 80 Level Of Care: New/Established - Level 3 Electronic Signature(s) Signed: 03/18/2017 5:20:16 PM By: Elliot Gurney, BSN, RN, CWS, Kim RN, BSN Entered By: Elliot Gurney, BSN, RN, CWS, Kim on 03/13/2017 11:49:29 [name] ([id number]) -------------------------------------------------------------------------------- Encounter Discharge Information Details [name] Date of Service: 03/13/2017 11:15 AM Medical Record Number: [id number] Patient Account Number: [id number] Date of Birth/Sex: Mar 13, 1946 (71 y.o. Female) Treating RN: Huel Coventry Primary Care Maliha Outten: Milon Score Other Clinician: Referring Jaileen Janelle: Referral, Self Treating  Keita Demarco/Extender: Rudene Re in Treatment: 3 Encounter Discharge Information Items Discharge Pain Level: 0 Discharge Condition: Stable Ambulatory Status: Ambulatory Discharge Destination: Home Transportation: Other Accompanied By: self Schedule Follow-up Appointment: Yes Medication Reconciliation completed Yes and provided to Patient/Care Rayshon Albaugh: Patient Clinical Summary of Care: Declined Electronic Signature(s) Signed: 03/18/2017 5:20:16 PM By: Elliot Gurney, BSN, RN, CWS, Kim RN, BSN Entered By: Elliot Gurney, BSN, RN, CWS, Kim on 03/13/2017 11:53:59 [name] ([id number]) -------------------------------------------------------------------------------- General Visit Notes Details [name] Date of Service: 03/13/2017 11:15 AM Medical Record Number: [id number] Patient Account Number: [id number] Date of Birth/Sex: 26-Aug-1945 (71 y.o. Female) Treating RN: Huel Coventry Primary Care Jadesola Poynter: Milon Score Other Clinician: Referring Whalen Trompeter: Referral, Self Treating Dewel Lotter/Extender: Rudene Re in Treatment: 3 Notes Patient has taken home the [facility] butler to try this week when donning and doffing stockings. Electronic Signature(s) Signed: 03/18/2017 5:20:16 PM By: Elliot Gurney, BSN, RN, CWS, Kim RN, BSN Entered By: Elliot Gurney, BSN, RN, CWS, Kim on 03/13/2017 11:58:31 [name] ([id number]) -------------------------------------------------------------------------------- Lower Extremity Assessment Details [name] Date of Service: 03/13/2017 11:15 AM Medical Record Number: [id number] Patient Account Number: [id number] Date of Birth/Sex: August 19, 1945 (71 y.o. Female) Treating RN: Huel Coventry Primary Care Jamayah Myszka: Milon Score Other Clinician: Referring Nikoletta Varma: Referral, Self Treating Temperence Zenor/Extender: Rudene Re in Treatment: 3 Vascular Assessment Pulses: Dorsalis Pedis Palpable: [Right:Yes] Posterior Tibial Extremity  colors, hair growth, and conditions: Extremity Color: [Right:Normal] Hair Growth on Extremity: [Right:Yes] Temperature of Extremity: [Right:Warm] Capillary Refill: [Right:< 3 seconds] Dependent Rubor: [Right:No] Blanched when Elevated: [Right:No] Lipodermatosclerosis: [Right:No] Toe Nail Assessment Left: Right: Thick: Yes Discolored: Yes Deformed: Yes Improper Length and Hygiene: Yes Electronic Signature(s) Signed: 03/18/2017 5:20:16 PM By: Elliot Gurney, BSN, RN, CWS, Kim RN, BSN Entered By: Elliot Gurney, BSN, RN, CWS, Kim  on 03/13/2017 11:15:13 Janet SimpersLANGLEY, Gabriellah C. ([id number]) -------------------------------------------------------------------------------- Multi Wound Chart Details Patient Name: Janet SimpersLANGLEY, Livian C. Date of Service: 03/13/2017 11:15 AM Medical Record Number: [id number] Patient Account Number: [id number] Date of Birth/Sex: Nov 16, 1945 (71 y.o. Female) Treating RN: Huel CoventryWoody, Kim Primary Care Hendrick Pavich: Milon ScoreJONES, CARON Other Clinician: Referring Lauraine Crespo: Referral, Self Treating Chantay Whitelock/Extender: Rudene ReBritto, Errol Weeks in Treatment: 3 Vital Signs Height(in): 67 Pulse(bpm): 72 Weight(lbs): 202 Blood Pressure 139/88 (mmHg): Body Mass Index(BMI): 32 Temperature(F): 97.9 Respiratory Rate 16 (breaths/min): Photos: [N/A:N/A] Wound Location: Right, Distal, Anterior Right, Proximal, Anterior N/A Lower Leg Lower Leg Wounding Event: Gradually Appeared Gradually Appeared N/A Primary Etiology: Diabetic Wound/Ulcer of Diabetic Wound/Ulcer of N/A the Lower Extremity the Lower Extremity Date Acquired: 02/14/2017 02/14/2017 N/A Weeks of Treatment: 3 3 N/A Wound Status: Open Open N/A Measurements L x W x D 0.1x0.1x0.1 0.1x0.2x0.1 N/A (cm) Area (cm) : 0.008 0.016 N/A Volume (cm) : 0.001 0.002 N/A % Reduction in Area: 50.00% 0.00% N/A % Reduction in Volume: 66.70% 0.00% N/A Classification: Grade 1 Grade 1 N/A Periwound Skin Texture: No Abnormalities Noted No Abnormalities Noted N/A Periwound Skin  No Abnormalities Noted No Abnormalities Noted N/A Moisture: Periwound Skin Color: No Abnormalities Noted No Abnormalities Noted N/A Tenderness on No No N/A Palpation: Treatment Notes Janet SimpersLANGLEY, Chene C. ([id number]) Electronic Signature(s) Signed: 03/18/2017 5:20:16 PM By: Elliot GurneyWoody, BSN, RN, CWS, Kim RN, BSN Entered By: Elliot GurneyWoody, BSN, RN, CWS, Kim on 03/13/2017 11:48:25 Janet SimpersLANGLEY, Gabbie C. ([id number]) -------------------------------------------------------------------------------- Multi-Disciplinary Care Plan Details Patient Name: Janet SimpersLANGLEY, Timia C. Date of Service: 03/13/2017 11:15 AM Medical Record Number: [id number] Patient Account Number: [id number] Date of Birth/Sex: Nov 16, 1945 (71 y.o. Female) Treating RN: Huel CoventryWoody, Kim Primary Care Elzie Sheets: Milon ScoreJONES, CARON Other Clinician: Referring Krupa Stege: Referral, Self Treating Christyn Gutkowski/Extender: Rudene ReBritto, Errol Weeks in Treatment: 3 Active Inactive ` Orientation to the Wound Care Program Nursing Diagnoses: Knowledge deficit related to the wound healing center program Goals: Patient/caregiver will verbalize understanding of the Wound Healing Center Program Date Initiated: 02/20/2017 Target Resolution Date: 03/22/2017 Goal Status: Active Interventions: Provide education on orientation to the wound center Notes: ` Pain, Acute or Chronic Nursing Diagnoses: Pain, acute or chronic: actual or potential Potential alteration in comfort, pain Goals: Patient/caregiver will verbalize adequate pain control between visits Date Initiated: 02/20/2017 Target Resolution Date: 05/24/2017 Goal Status: Active Interventions: Complete pain assessment as per visit requirements Notes: ` Wound/Skin Impairment Nursing Diagnoses: Impaired tissue integrity Walby, Niara C. ([id number]) Knowledge deficit related to ulceration/compromised skin integrity Goals: Ulcer/skin breakdown will have a volume reduction of 80% by week 12 Date Initiated: 02/20/2017 Target Resolution Date:  06/14/2017 Goal Status: Active Interventions: Assess patient/caregiver ability to perform ulcer/skin care regimen upon admission and as needed Assess ulceration(s) every visit Notes: Electronic Signature(s) Signed: 03/18/2017 5:20:16 PM By: Elliot GurneyWoody, BSN, RN, CWS, Kim RN, BSN Entered By: Elliot GurneyWoody, BSN, RN, CWS, Kim on 03/13/2017 11:44:12 Janet SimpersLANGLEY, Malana C. ([id number]) -------------------------------------------------------------------------------- Pain Assessment Details Patient Name: Janet SimpersLANGLEY, Jermisha C. Date of Service: 03/13/2017 11:15 AM Medical Record Number: [id number] Patient Account Number: [id number] Date of Birth/Sex: Nov 16, 1945 (71 y.o. Female) Treating RN: Huel CoventryWoody, Kim Primary Care Lleyton Byers: Milon ScoreJONES, CARON Other Clinician: Referring Chino Sardo: Referral, Self Treating Livianna Petraglia/Extender: Rudene ReBritto, Errol Weeks in Treatment: 3 Active Problems Location of Pain Severity and Description of Pain Patient Has Paino No Site Locations With Dressing Change: No Pain Management and Medication Current Pain Management: Goals for Pain Management Topical or injectable lidocaine is offered to patient for acute pain when surgical debridement is performed. If needed, Patient is instructed to use over the  counter pain medication for the following 24-48 hours after debridement. Wound care MDs do not prescribed pain medications. Patient has chronic pain or uncontrolled pain. Patient has been instructed to make an appointment with their Primary Care Physician for pain management. Electronic Signature(s) Signed: 03/18/2017 5:20:16 PM By: Elliot Gurney, BSN, RN, CWS, Kim RN, BSN Entered By: Elliot Gurney, BSN, RN, CWS, Kim on 03/13/2017 11:07:08 [name] ([id number]) -------------------------------------------------------------------------------- Patient/Caregiver Education Details [name] Date of Service: 03/13/2017 11:15 AM Medical Record Number: [id number] Patient Account Number: [id number] Date of  Birth/Gender: May 06, 1946 (71 y.o. Female) Treating RN: Huel Coventry Primary Care Physician: Milon Score Other Clinician: Referring Physician: Referral, Self Treating Physician/Extender: Rudene Re in Treatment: 3 Education Assessment Education Provided To: Patient Education Topics Provided Wound/Skin Impairment: Handouts: Caring for Your Ulcer, Other: wound care as prescribed Methods: Demonstration, Explain/Verbal Responses: State content correctly Electronic Signature(s) Signed: 03/18/2017 5:20:16 PM By: Elliot Gurney, BSN, RN, CWS, Kim RN, BSN Entered By: Elliot Gurney, BSN, RN, CWS, Kim on 03/13/2017 11:54:23 [name] ([id number]) -------------------------------------------------------------------------------- Wound Assessment Details [name] Date of Service: 03/13/2017 11:15 AM Medical Record Number: [id number] Patient Account Number: [id number] Date of Birth/Sex: July 15, 1946 (71 y.o. Female) Treating RN: Huel Coventry Primary Care Vianey Caniglia: Milon Score Other Clinician: Referring Jules Baty: Referral, Self Treating Madasyn Heath/Extender: Rudene Re in Treatment: 3 Wound Status Wound Number: 2 Primary Diabetic Wound/Ulcer of the Lower Etiology: Extremity Wound Location: Right Lower Leg - Anterior, Distal Wound Open Status: Wounding Event: Gradually Appeared Comorbid Chronic Obstructive Pulmonary Date Acquired: 02/14/2017 History: Disease (COPD), Hypertension, Type Weeks Of Treatment: 3 II Diabetes Clustered Wound: No Photos Wound Measurements Length: (cm) 0.1 Width: (cm) 0.1 Depth: (cm) 0.1 Area: (cm) 0.008 Volume: (cm) 0.001 % Reduction in Area: 50% % Reduction in Volume: 66.7% Epithelialization: None Tunneling: Yes Position (o'clock): 3 Wound Description Classification: Grade 1 Wound Margin: Distinct, outline attached Exudate Amount: Small Exudate Type: Purulent Exudate Color: yellow, brown, green Foul Odor After Cleansing:  No Slough/Fibrino No Wound Bed Granulation Amount: None Present (0%) Necrotic Amount: None Present (0%) Periwound Skin Texture Texture Color No Abnormalities Noted: No No Abnormalities Noted: No Herdt, Addysen C. ([id number]) Callus: No Atrophie Blanche: No Crepitus: No Cyanosis: No Excoriation: No Ecchymosis: No Induration: No Erythema: No Rash: No Hemosiderin Staining: No Scarring: No Mottled: No Pallor: No Moisture Rubor: No No Abnormalities Noted: No Dry / Scaly: No Temperature / Pain Maceration: No Temperature: No Abnormality Tenderness on Palpation: Yes Wound Preparation Ulcer Cleansing: Rinsed/Irrigated with Saline Topical Anesthetic Applied: Other: lidocaine4 %, Assessment Notes Unable to visualize wound. Using my thumb and rubbing from the left of the location of the opening a small amount of purulent drainage appears. Treatment Notes Wound #2 (Right, Distal, Anterior Lower Leg) 1. Cleansed with: Clean wound with Normal Saline 2. Anesthetic Topical Lidocaine 4% cream to wound bed prior to debridement 4. Dressing Applied: Foam 5. Secondary Dressing Applied Kerlix/Conform 7. Secured with Tape Notes Government social research officer) Signed: 03/13/2017 11:57:46 AM By: Elliot Gurney, BSN, RN, CWS, Kim RN, BSN Entered By: Elliot Gurney, BSN, RN, CWS, Kim on 03/13/2017 11:57:45 [name] ([id number]) -------------------------------------------------------------------------------- Wound Assessment Details [name] Date of Service: 03/13/2017 11:15 AM Medical Record Number: [id number] Patient Account Number: [id number] Date of Birth/Sex: 10/02/1945 (71 y.o. Female) Treating RN: Huel Coventry Primary Care Lafaye Mcelmurry: Milon Score Other Clinician: Referring Eben Choinski: Referral, Self Treating Melodee Lupe/Extender: Rudene Re in Treatment: 3 Wound Status Wound Number: 3 Primary Diabetic Wound/Ulcer of the  Lower Etiology: Extremity Wound Location:  Right, Proximal, Anterior Lower Leg Wound Status: Open Wounding Event: Gradually Appeared Date Acquired: 02/14/2017 Weeks Of Treatment: 3 Clustered Wound: No Photos Photo Uploaded By: Elliot Gurney, BSN, RN, CWS, Kim on 03/13/2017 11:38:31 Wound Measurements Length: (cm) 0.1 Width: (cm) 0.2 Depth: (cm) 0.1 Area: (cm) 0.016 Volume: (cm) 0.002 % Reduction in Area: 0% % Reduction in Volume: 0% Wound Description Classification: Grade 1 Periwound Skin Texture Texture Color No Abnormalities Noted: No No Abnormalities Noted: No Moisture No Abnormalities Noted: No Treatment Notes Wound #3 (Right, Proximal, Anterior Lower Leg) 1. Cleansed with: Clean wound with Normal Saline Brugger, Shandiin C. ([id number]) 2. Anesthetic Topical Lidocaine 4% cream to wound bed prior to debridement 4. Dressing Applied: Foam 5. Secondary Dressing Applied Kerlix/Conform 7. Secured with Tape Notes Government social research officer) Signed: 03/18/2017 5:20:16 PM By: Elliot Gurney, BSN, RN, CWS, Kim RN, BSN Entered By: Elliot Gurney, BSN, RN, CWS, Kim on 03/13/2017 11:14:09 [name] ([id number]) -------------------------------------------------------------------------------- Vitals Details [name] Date of Service: 03/13/2017 11:15 AM Medical Record Number: [id number] Patient Account Number: [id number] Date of Birth/Sex: Jul 05, 1946 (71 y.o. Female) Treating RN: Huel Coventry Primary Care Oliva Montecalvo: Milon Score Other Clinician: Referring Kamauri Kathol: Referral, Self Treating Torrence Hammack/Extender: Rudene Re in Treatment: 3 Vital Signs Time Taken: 11:07 Temperature (F): 97.9 Height (in): 67 Pulse (bpm): 72 Weight (lbs): 202 Respiratory Rate (breaths/min): 16 Body Mass Index (BMI): 31.6 Blood Pressure (mmHg): 139/88 Reference Range: 80 - 120 mg / dl Electronic Signature(s) Signed: 03/18/2017 5:20:16 PM By: Elliot Gurney, BSN, RN, CWS, Kim RN, BSN Entered By: Elliot Gurney, BSN, RN, CWS, Kim on 03/13/2017  11:07:40

## 2017-03-19 NOTE — Progress Notes (Signed)
[name] ([id number]) Visit Report for 03/13/2017 Chief Complaint Document Details [name], [name] Date of Service: 03/13/2017 11:15 AM Medical Record Number: [id number] Patient Account Number: [id number] Date of Birth/Sex: Mar 04, 1946 (71 y.o. Female) Treating RN: Huel Coventry Primary Care Provider: Milon Score Other Clinician: Referring Provider: Referral, Self Treating Provider/Extender: Rudene Re in Treatment: 3 Information Obtained from: Patient Chief Complaint patient is here for follow-up evaluation of a right lower extremity ulcer Electronic Signature(s) Signed: 03/13/2017 4:28:32 PM By: Evlyn Kanner MD, FACS Entered By: Evlyn Kanner on 03/13/2017 11:42:26 [name] ([id number]) -------------------------------------------------------------------------------- HPI Details [name] Date of Service: 03/13/2017 11:15 AM Medical Record Number: [id number] Patient Account Number: [id number] Date of Birth/Sex: 1945/11/27 (71 y.o. Female) Treating RN: Huel Coventry Primary Care Provider: Milon Score Other Clinician: Referring Provider: Referral, Self Treating Provider/Extender: Rudene Re in Treatment: 3 History of Present Illness Location: right lower extremity in the area of the lateral calf below the knee Quality: She is having no pain Context: The wound occurred when the patient a blunt injury and fall injury Modifying Factors: None as patient was healed Associated Signs and Symptoms: Patient reports having increase discharge. HPI Description: Readmission: 02/20/17 on evaluation today patient's wound which had previously healed in June 2018 unfortunately had two areas that began to drain in the past week. She therefore called to be reevaluated in regard to this wound. At the location of the healed wound server she has two panel locations one at 9 o'clock and one at 6 o'clock it appeared to be draining fluid and it  seems that she did not fully heal underneath the wound as this close. This therefore trapped fluid and now has reopened. Fortunately it seems to be mild and I think this will likely close and heal very quickly although obviously this is frustrating for the patient and she thought that she was done with this wound. No fevers, chills, nausea, or vomiting noted at this time. Patient is having no pain. 02/27/17 on evaluation today patient's right lower extremity ulcer appears to be doing about the same. We have not made a lot of improvements. Fortunately we did get the results of her culture back which showed no growth this was good news. Again I was not overly convinced that this would be infected but nonetheless we wanted to be sure just considering how it was progressing. Fortunately she has no significant pain other than with dressing changes and then it's around a two out of 10. No fevers, chills, nausea, or vomiting noted at this time. 03/07/2017 -- this patient was discharged on 12/27/2016 has not been wearing her compression stockings as advised but has been wearing TED hose which are basically useless for a problem. He continues to smoke about half pack of cigarettes a day ======== Old Notes 71 year old patient has been referred to as with right lower extremity wound with edema and ecchymosis, after having a injury due to a fall on 08/20/2016. she has a history of diabetes mellitus type 2 which is uncomplicated, chronic back pain, COPD, hypertension, hypothyroidism, supraventricular tachycardia and tobacco abuse. He is status post neck surgery, appendectomy, cholecystectomy, hernia repair. He smokes about half a pack of cigarettes a day for the last 50 years. Recently on doxycycline for 10 days and was given symptomatic treatment for wound. 10/04/2016 -- the patient tolerated the snap vacuum system well and had no significant problems. She continues to smoke and is trying to give  it  up. 10/11/2016 -- she continues to tolerate the snap like him system fairly well and other than that she has no [name], [name] ([id number]) fresh issues 11/01/16- she is here for follow-up evaluation of her right lower extremity ulcer. She is tolerating the SNAP negative pressure system. She is voicing no complaints of pain. She states her glucose levels are consistently less than 150 with her evening glucose levels being higher than morning glucose levels. She continues to smoke, approximately 0.5-ppd which is a reduction of 1-ppd approximately 6 months ago. 11/08/16 patient on evaluation today fortunately does not appear to be having as much discomfort as she has in the past. She is tolerating the dressing changes well although she does note some irritation where the wound VAC has been applied. She specifically wonders if we could see about taking a break possibly progressing to discontinuation of the wound VAC. There is no evidence of infection. 12/20/2016 -- we had prepared to use a Grafix skin substitute today but her wound had improved remarkably and we did not use this ===== Electronic Signature(s) Signed: 03/13/2017 4:28:32 PM By: Evlyn Kanner MD, FACS Entered By: Evlyn Kanner on 03/13/2017 11:42:31 [name] ([id number]) -------------------------------------------------------------------------------- Physical Exam Details [name] Date of Service: 03/13/2017 11:15 AM Medical Record Number: [id number] Patient Account Number: [id number] Date of Birth/Sex: [birth year] (71 y.o. Female) Treating RN: Huel Coventry Primary Care Provider: Milon Score Other Clinician: Referring Provider: Referral, Self Treating Provider/Extender: Rudene Re in Treatment: 3 Constitutional . Pulse regular. Respirations normal and unlabored. Afebrile. . Eyes Nonicteric. Reactive to light. Ears, Nose, Mouth, and Throat Lips, teeth, and gums WNL.Marland Kitchen Moist mucosa without  lesions. Neck supple and nontender. No palpable supraclavicular or cervical adenopathy. Normal sized without goiter. Respiratory WNL. No retractions.. Cardiovascular Pedal Pulses WNL. No clubbing, cyanosis or edema. Lymphatic No adneopathy. No adenopathy. No adenopathy. Musculoskeletal Adexa without tenderness or enlargement.. Digits and nails w/o clubbing, cyanosis, infection, petechiae, ischemia, or inflammatory conditions.. Integumentary (Hair, Skin) No suspicious lesions. No crepitus or fluctuance. No peri-wound warmth or erythema. No masses.Marland Kitchen Psychiatric Judgement and insight Intact.. No evidence of depression, anxiety, or agitation.. Notes the one spot at about the 9:00 position is getting miniscule amount of lymph drainage and there is no surrounding cellulitis or significant open wound Electronic Signature(s) Signed: 03/13/2017 4:28:32 PM By: Evlyn Kanner MD, FACS Entered By: Evlyn Kanner on 03/13/2017 11:43:02 [name] ([id number]) -------------------------------------------------------------------------------- Physician Orders Details [name] Date of Service: 03/13/2017 11:15 AM Medical Record Number: [id number] Patient Account Number: [id number] Date of Birth/Sex: 06/08/1946 (71 y.o. Female) Treating RN: Huel Coventry Primary Care Provider: Milon Score Other Clinician: Referring Provider: Referral, Self Treating Provider/Extender: Rudene Re in Treatment: 3 Verbal / Phone Orders: No Diagnosis Coding Wound Cleansing Wound #2 Right,Distal,Anterior Lower Leg o Clean wound with Normal Saline. o Cleanse wound with mild soap and water Wound #3 Right,Proximal,Anterior Lower Leg o Clean wound with Normal Saline. o Cleanse wound with mild soap and water Anesthetic Wound #2 Right,Distal,Anterior Lower Leg o Topical Lidocaine 4% cream applied to wound bed prior to debridement Wound #3 Right,Proximal,Anterior Lower Leg o  Topical Lidocaine 4% cream applied to wound bed prior to debridement Skin Barriers/Peri-Wound Care Wound #2 Right,Distal,Anterior Lower Leg o Skin Prep Wound #3 Right,Proximal,Anterior Lower Leg o Skin Prep Primary Wound Dressing Wound #2 Right,Distal,Anterior Lower Leg o Foam Wound #3 Right,Proximal,Anterior Lower Leg o Foam Secondary Dressing Wound #2 Right,Distal,Anterior Lower Leg o Dry  Gauze - bolster o Conform/Kerlix Wound #3 Right,Proximal,Anterior Lower Leg Lantzy, Artemisa C. ([id number]) o Dry Gauze - bolster o Conform/Kerlix Dressing Change Frequency Wound #2 Right,Distal,Anterior Lower Leg o Change dressing every other day. Wound #3 Right,Proximal,Anterior Lower Leg o Change dressing every other day. Follow-up Appointments Wound #2 Right,Distal,Anterior Lower Leg o Return Appointment in 1 week. Wound #3 Right,Proximal,Anterior Lower Leg o Return Appointment in 1 week. Edema Control Wound #2 Right,Distal,Anterior Lower Leg o Support Garment 10-20 mm/Hg pressure to: Wound #3 Right,Proximal,Anterior Lower Leg o Support Garment 10-20 mm/Hg pressure to: Additional Orders / Instructions Wound #2 Right,Distal,Anterior Lower Leg o Increase protein intake. Wound #3 Right,Proximal,Anterior Lower Leg o Increase protein intake. Electronic Signature(s) Signed: 03/13/2017 4:28:32 PM By: Evlyn Kanner MD, FACS Signed: 03/18/2017 5:20:16 PM By: Elliot Gurney BSN, RN, CWS, Kim RN, BSN Entered By: Elliot Gurney, BSN, RN, CWS, Kim on 03/13/2017 11:37:47 ONDA, KATTNER ([id number]) -------------------------------------------------------------------------------- Problem List Details [name] Date of Service: 03/13/2017 11:15 AM Medical Record Number: [id number] Patient Account Number: [id number] Date of Birth/Sex: July 31, 1945 (71 y.o. 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 F17.218 Nicotine dependence, cigarettes, with other nicotine- 02/20/2017 Yes induced disorders I89.0 Lymphedema, not elsewhere classified 03/07/2017 Yes Inactive Problems Resolved Problems Electronic Signature(s) Signed: 03/13/2017 4:28:32 PM By: Evlyn Kanner MD, FACS Entered By: Evlyn Kanner on 03/13/2017 11:42:14 [name] ([id number]) -------------------------------------------------------------------------------- Progress Note Details [name] Date of Service: 03/13/2017 11:15 AM Medical Record Number: [id number] Patient Account Number: [id number] Date of Birth/Sex: [birth year] (71 y.o. Female) Treating RN: Huel Coventry Primary Care Provider: Milon Score Other Clinician: Referring Provider: Referral, Self Treating Provider/Extender: Rudene Re in Treatment: 3 Subjective Chief Complaint Information obtained from Patient patient is here for follow-up evaluation of a right lower extremity ulcer History of Present Illness (HPI) The following HPI elements were documented for the patient's wound: Location: right lower extremity in the area of the lateral calf below the knee Quality: She is having no pain Context: The wound occurred when the patient a blunt injury and fall injury Modifying Factors: None as patient was healed Associated Signs and Symptoms: Patient reports having increase discharge. Readmission: 02/20/17 on evaluation today patient's wound which had previously healed in June 2018 unfortunately had two areas that began to drain in the past week. She therefore called to be reevaluated in regard to this wound. At the location of the  healed wound server she has two panel locations one at 9 o'clock and one at 6 o'clock it appeared to be draining fluid and it seems that she did not fully heal underneath the wound as this close. This therefore trapped fluid and now has reopened. Fortunately it seems to be mild and I think this will likely close and heal very quickly although obviously this is frustrating for the patient and she thought that she was done with this wound. No fevers, chills, nausea, or vomiting noted at this time. Patient is having no pain. 02/27/17 on evaluation today patient's right lower extremity ulcer appears to be doing about the same. We have not made a lot of improvements. Fortunately we did get the results of her culture back which showed no growth this  was good news. Again I was not overly convinced that this would be infected but nonetheless we wanted to be sure just considering how it was progressing. Fortunately she has no significant pain other than with dressing changes and then it's around a two out of 10. No fevers, chills, nausea, or vomiting noted at this time. 03/07/2017 -- this patient was discharged on 12/27/2016 has not been wearing her compression stockings as advised but has been wearing TED hose which are basically useless for a problem. He continues to smoke about half pack of cigarettes a day ======== Old Notes 71 year old patient has been referred to as with right lower extremity wound with edema and ecchymosis, after having a injury due to a fall on 08/20/2016. she has a history of diabetes mellitus type 2 which is Klutts, Rossana C. ([id number]) uncomplicated, chronic back pain, COPD, hypertension, hypothyroidism, supraventricular tachycardia and tobacco abuse. He is status post neck surgery, appendectomy, cholecystectomy, hernia repair. He smokes about half a pack of cigarettes a day for the last 50 years. Recently on doxycycline for 10 days and was given symptomatic treatment for  wound. 10/04/2016 -- the patient tolerated the snap vacuum system well and had no significant problems. She continues to smoke and is trying to give it up. 10/11/2016 -- she continues to tolerate the snap like him system fairly well and other than that she has no fresh issues 11/01/16- she is here for follow-up evaluation of her right lower extremity ulcer. She is tolerating the SNAP negative pressure system. She is voicing no complaints of pain. She states her glucose levels are consistently less than 150 with her evening glucose levels being higher than morning glucose levels. She continues to smoke, approximately 0.5-ppd which is a reduction of 1-ppd approximately 6 months ago. 11/08/16 patient on evaluation today fortunately does not appear to be having as much discomfort as she has in the past. She is tolerating the dressing changes well although she does note some irritation where the wound VAC has been applied. She specifically wonders if we could see about taking a break possibly progressing to discontinuation of the wound VAC. There is no evidence of infection. 12/20/2016 -- we had prepared to use a Grafix skin substitute today but her wound had improved remarkably and we did not use this ===== Objective Constitutional Pulse regular. Respirations normal and unlabored. Afebrile. Vitals Time Taken: 11:07 AM, Height: 67 in, Weight: 202 lbs, BMI: 31.6, Temperature: 97.9 F, Pulse: 72 bpm, Respiratory Rate: 16 breaths/min, Blood Pressure: 139/88 mmHg. Eyes Nonicteric. Reactive to light. Ears, Nose, Mouth, and Throat Lips, teeth, and gums WNL.Marland Kitchen Moist mucosa without lesions. Neck supple and nontender. No palpable supraclavicular or cervical adenopathy. Normal sized without goiter. Respiratory Gawron, Jleigh C. ([id number]) WNL. No retractions.. Cardiovascular Pedal Pulses WNL. No clubbing, cyanosis or edema. Lymphatic No adneopathy. No adenopathy. No  adenopathy. Musculoskeletal Adexa without tenderness or enlargement.. Digits and nails w/o clubbing, cyanosis, infection, petechiae, ischemia, or inflammatory conditions.Marland Kitchen Psychiatric Judgement and insight Intact.. No evidence of depression, anxiety, or agitation.. General Notes: the one spot at about the 9:00 position is getting miniscule amount of lymph drainage and there is no surrounding cellulitis or significant open wound Integumentary (Hair, Skin) No suspicious lesions. No crepitus or fluctuance. No peri-wound warmth or erythema. No masses.. Wound #2 status is Open. Original cause of wound was Gradually Appeared. The wound is located on the Right,Distal,Anterior Lower Leg. The wound measures 0.1cm length x 0.1cm width x 0.1cm depth; 0.008cm^2  area and 0.001cm^3 volume. There is tunneling at 3:00. There is a small amount of purulent drainage noted. The wound margin is distinct with the outline attached to the wound base. There is no granulation within the wound bed. There is no necrotic tissue within the wound bed. The periwound skin appearance did not exhibit: Callus, Crepitus, Excoriation, Induration, Rash, Scarring, Dry/Scaly, Maceration, Atrophie Blanche, Cyanosis, Ecchymosis, Hemosiderin Staining, Mottled, Pallor, Rubor, Erythema. Periwound temperature was noted as No Abnormality. The periwound has tenderness on palpation. General Notes: Unable to visualize wound. Using my thumb and rubbing from the left of the location of the opening a small amount of purulent drainage appears. Wound #3 status is Open. Original cause of wound was Gradually Appeared. The wound is located on the Right,Proximal,Anterior Lower Leg. The wound measures 0.1cm length x 0.2cm width x 0.1cm depth; 0.016cm^2 area and 0.002cm^3 volume. Assessment Active Problems ICD-10 E11.622 - Type 2 diabetes mellitus with other skin ulcer L97.212 - Non-pressure chronic ulcer of right calf with fat layer exposed F17.218  - Nicotine dependence, cigarettes, with other nicotine-induced disorders I89.0 - Lymphedema, not elsewhere classified Acebo, Makynli C. ([id number]) Plan Wound Cleansing: Wound #2 Right,Distal,Anterior Lower Leg: Clean wound with Normal Saline. Cleanse wound with mild soap and water Wound #3 Right,Proximal,Anterior Lower Leg: Clean wound with Normal Saline. Cleanse wound with mild soap and water Anesthetic: Wound #2 Right,Distal,Anterior Lower Leg: Topical Lidocaine 4% cream applied to wound bed prior to debridement Wound #3 Right,Proximal,Anterior Lower Leg: Topical Lidocaine 4% cream applied to wound bed prior to debridement Skin Barriers/Peri-Wound Care: Wound #2 Right,Distal,Anterior Lower Leg: Skin Prep Wound #3 Right,Proximal,Anterior Lower Leg: Skin Prep Primary Wound Dressing: Wound #2 Right,Distal,Anterior Lower Leg: Foam Wound #3 Right,Proximal,Anterior Lower Leg: Foam Secondary Dressing: Wound #2 Right,Distal,Anterior Lower Leg: Dry Gauze - bolster Conform/Kerlix Wound #3 Right,Proximal,Anterior Lower Leg: Dry Gauze - bolster Conform/Kerlix Dressing Change Frequency: Wound #2 Right,Distal,Anterior Lower Leg: Change dressing every other day. Wound #3 Right,Proximal,Anterior Lower Leg: Change dressing every other day. Follow-up Appointments: Wound #2 Right,Distal,Anterior Lower Leg: Return Appointment in 1 week. Wound #3 Right,Proximal,Anterior Lower Leg: Return Appointment in 1 week. Edema Control: Wound #2 Right,Distal,Anterior Lower Leg: Support Garment 10-20 mm/Hg pressure to: Retz, Malee C. ([id number]) Wound #3 Right,Proximal,Anterior Lower Leg: Support Garment 10-20 mm/Hg pressure to: Additional Orders / Instructions: Wound #2 Right,Distal,Anterior Lower Leg: Increase protein intake. Wound #3 Right,Proximal,Anterior Lower Leg: Increase protein intake. the patient is improving overall and after review today, I have recommended: 1. Elevation and  exercise 2. Compression stockings of the 20-30 mm variety to be worn every day according to a strict schedule 3. Foam and a Kerlix and Coban dressing over that area to have firm compression and obliterate the dead space in this region 4. I have again discussed with her the need to completely give up smoking and have urged her to be compliant 5. I will see her back next week Electronic Signature(s) Signed: 03/13/2017 4:28:32 PM By: Evlyn Kanner MD, FACS Entered By: Evlyn Kanner on 03/13/2017 15:54:44 GABRIELLA, WOODHEAD ([id number]) -------------------------------------------------------------------------------- SuperBill Details [name] Date of Service: 03/13/2017 Medical Record Number: [id number] Patient Account Number: [id number] Date of Birth/Sex: Nov 22, 1945 (71 y.o. Female) Treating RN: Huel Coventry Primary Care Provider: Milon Score Other Clinician: Referring Provider: Referral, Self Treating Provider/Extender: Rudene Re in Treatment: 3 Diagnosis Coding ICD-10 Codes Code Description E11.622 Type 2 diabetes mellitus with other skin ulcer L97.212 Non-pressure chronic ulcer of right calf with fat layer exposed F17.218 Nicotine  dependence, cigarettes, with other nicotine-induced disorders I89.0 Lymphedema, not elsewhere classified Facility Procedures CPT4 Code: [id number] Description: 99213 - WOUND CARE VISIT-LEV 3 EST PT Modifier: Quantity: 1 Physician Procedures CPT4 Code Description: [id number] 99213 - WC PHYS LEVEL 3 - EST PT ICD-10 Description Diagnosis E11.622 Type 2 diabetes mellitus with other skin ulcer L97.212 Non-pressure chronic ulcer of right calf with fat l F17.218 Nicotine dependence, cigarettes, with  other nicotin I89.0 Lymphedema, not elsewhere classified Modifier: ayer exposed e-induced di Quantity: 1 sorders Electronic Signature(s) Signed: 03/13/2017 4:28:32 PM By: Evlyn KannerBritto, Clarinda Obi MD, FACS Signed: 03/18/2017 5:20:16 PM By: Elliot GurneyWoody, BSN, RN,  CWS, Kim RN, BSN Entered By: Elliot GurneyWoody, BSN, RN, CWS, Kim on 03/13/2017 11:53:05

## 2017-03-20 ENCOUNTER — Encounter: Payer: Medicare Other | Attending: Surgery | Admitting: Surgery

## 2017-03-20 DIAGNOSIS — I890 Lymphedema, not elsewhere classified: Secondary | ICD-10-CM | POA: Diagnosis not present

## 2017-03-20 DIAGNOSIS — E11622 Type 2 diabetes mellitus with other skin ulcer: Secondary | ICD-10-CM | POA: Insufficient documentation

## 2017-03-20 DIAGNOSIS — M549 Dorsalgia, unspecified: Secondary | ICD-10-CM | POA: Diagnosis not present

## 2017-03-20 DIAGNOSIS — F17218 Nicotine dependence, cigarettes, with other nicotine-induced disorders: Secondary | ICD-10-CM | POA: Insufficient documentation

## 2017-03-20 DIAGNOSIS — J449 Chronic obstructive pulmonary disease, unspecified: Secondary | ICD-10-CM | POA: Insufficient documentation

## 2017-03-20 DIAGNOSIS — L97212 Non-pressure chronic ulcer of right calf with fat layer exposed: Secondary | ICD-10-CM | POA: Insufficient documentation

## 2017-03-20 DIAGNOSIS — G8929 Other chronic pain: Secondary | ICD-10-CM | POA: Insufficient documentation

## 2017-03-20 DIAGNOSIS — E039 Hypothyroidism, unspecified: Secondary | ICD-10-CM | POA: Diagnosis not present

## 2017-03-20 DIAGNOSIS — I471 Supraventricular tachycardia: Secondary | ICD-10-CM | POA: Insufficient documentation

## 2017-03-22 NOTE — Progress Notes (Signed)
[name], Brittay C. ([id number]) Visit Report for 03/20/2017 Arrival Information Details [name], [name] Date of Service: 03/20/2017 3:15 PM Medical Record Number: [id number] Patient Account Number: [id number] Date of Birth/Sex: 10/04/1945 (71 y.o. Female) Treating RN: Curtis Sitesorthy, Joanna Primary Care Azan Maneri: Milon ScoreJONES, CARON Other Clinician: Referring Astoria Condon: Referral, Self Treating Jini Horiuchi/Extender: Rudene ReBritto, Errol Weeks in Treatment: 4 Visit Information History Since Last Visit Added or deleted any medications: No Patient Arrived: Ambulatory Any new allergies or adverse reactions: No Arrival Time: 15:22 Had a fall or experienced change in No Accompanied By: self activities of daily living that may affect Transfer Assistance: None risk of falls: Patient Identification Verified: Yes Signs or symptoms of abuse/neglect since last No Secondary Verification Process Yes visito Completed: Hospitalized since last visit: No Patient Requires Transmission-Based No Has Dressing in Place as Prescribed: Yes Precautions: Has Compression in Place as Prescribed: Yes Patient Has Alerts: Yes Pain Present Now: No Patient Alerts: DM II Electronic Signature(s) Signed: 03/20/2017 4:41:02 PM By: Curtis Sitesorthy, Joanna Entered By: Curtis Sitesorthy, Joanna on 03/20/2017 15:22:27 [name], [name] ([id number]) -------------------------------------------------------------------------------- Clinic Level of Care Assessment Details [name], [name] Date of Service: 03/20/2017 3:15 PM Medical Record Number: [id number] Patient Account Number: [id number] Date of Birth/Sex: 10/04/1945 [age] y.o. Female) Treating RN: Curtis Sitesorthy, Joanna Primary Care Elisabetta Mishra: Milon ScoreJONES, CARON Other Clinician: Referring Rushton Early: Referral, Self Treating Moesha Sarchet/Extender: Rudene ReBritto, Errol Weeks in Treatment: 4 Clinic Level of Care Assessment Items TOOL 4 Quantity Score []  - Use when only an EandM is performed on FOLLOW-UP visit  0 ASSESSMENTS - Nursing Assessment / Reassessment X - Reassessment of Co-morbidities (includes updates in patient status) 1 10 X - Reassessment of Adherence to Treatment Plan 1 5 ASSESSMENTS - Wound and Skin Assessment / Reassessment X - Simple Wound Assessment / Reassessment - one wound 1 5 []  - Complex Wound Assessment / Reassessment - multiple wounds 0 []  - Dermatologic / Skin Assessment (not related to wound area) 0 ASSESSMENTS - Focused Assessment []  - Circumferential Edema Measurements - multi extremities 0 []  - Nutritional Assessment / Counseling / Intervention 0 X - Lower Extremity Assessment (monofilament, tuning fork, pulses) 1 5 []  - Peripheral Arterial Disease Assessment (using hand held doppler) 0 ASSESSMENTS - Ostomy and/or Continence Assessment and Care []  - Incontinence Assessment and Management 0 []  - Ostomy Care Assessment and Management (repouching, etc.) 0 PROCESS - Coordination of Care X - Simple Patient / Family Education for ongoing care 1 15 []  - Complex (extensive) Patient / Family Education for ongoing care 0 []  - Staff obtains ChiropractorConsents, Records, Test Results / Process Orders 0 []  - Staff telephones HHA, Nursing Homes / Clarify orders / etc 0 []  - Routine Transfer to another Facility (non-emergent condition) 0 [name], [name] ([id number]) []  - Routine Hospital Admission (non-emergent condition) 0 []  - New Admissions / Manufacturing engineernsurance Authorizations / Ordering NPWT, Apligraf, etc. 0 []  - Emergency Hospital Admission (emergent condition) 0 X - Simple Discharge Coordination 1 10 []  - Complex (extensive) Discharge Coordination 0 PROCESS - Special Needs []  - Pediatric / Minor Patient Management 0 []  - Isolation Patient Management 0 []  - Hearing / Language / Visual special needs 0 []  - Assessment of Community assistance (transportation, D/C planning, etc.) 0 []  - Additional assistance / Altered mentation 0 []  - Support Surface(s) Assessment (bed, cushion, seat, etc.)  0 INTERVENTIONS - Wound Cleansing / Measurement X - Simple Wound Cleansing - one wound 1 5 []  - Complex Wound Cleansing - multiple wounds 0 X - Wound  Imaging (photographs - any number of wounds) 1 5  - Wound Tracing (instead of photographs) 0 X - Simple Wound Measurement - one wound 1 5  - Complex Wound Measurement - multiple wounds 0 INTERVENTIONS - Wound Dressings X - Small Wound Dressing one or multiple wounds 1 10  - Medium Wound Dressing one or multiple wounds 0  - Large Wound Dressing one or multiple wounds 0  - Application of Medications - topical 0  - Application of Medications - injection 0 INTERVENTIONS - Miscellaneous  - External ear exam 0 [name], [name] ([id number])  - Specimen Collection (cultures, biopsies, blood, body fluids, etc.) 0  - Specimen(s) / Culture(s) sent or taken to Lab for analysis 0  - Patient Transfer (multiple staff / Michiel Sites Lift / Similar devices) 0  - Simple Staple / Suture removal (25 or less) 0  - Complex Staple / Suture removal (26 or more) 0  - Hypo / Hyperglycemic Management (close monitor of Blood Glucose) 0  - Ankle / Brachial Index (ABI) - do not check if billed separately 0 X - Vital Signs 1 5 Has the patient been seen at the hospital within the last three years: Yes Total Score: 80 Level Of Care: New/Established - Level 3 Electronic Signature(s) Signed: 03/20/2017 4:41:02 PM By: Curtis Sites Entered By: Curtis Sites on 03/20/2017 16:13:59 [name] ([id number]) -------------------------------------------------------------------------------- Encounter Discharge Information Details [name] Date of Service: 03/20/2017 3:15 PM Medical Record Number: [id number] Patient Account Number: [id number] Date of Birth/Sex: 1945-10-05 (70 y.o. Female) Treating RN: Curtis Sites Primary Care Stone Spirito: Milon Score Other Clinician: Referring Danarius Mcconathy: Referral, Self Treating Tawna Alwin/Extender:  Rudene Re in Treatment: 4 Encounter Discharge Information Items Discharge Pain Level: 0 Discharge Condition: Stable Ambulatory Status: Ambulatory Discharge Destination: Home Transportation: Private Auto Accompanied By: self Schedule Follow-up Appointment: Yes Medication Reconciliation completed and provided to Patient/Care No Bellina Tokarczyk: Provided on Clinical Summary of Care: 03/20/2017 Form Type Recipient Paper Patient ML Electronic Signature(s) Signed: 03/20/2017 4:14:45 PM By: Curtis Sites Entered By: Curtis Sites on 03/20/2017 16:14:45 [name] ([id number]) -------------------------------------------------------------------------------- Lower Extremity Assessment Details [name] Date of Service: 03/20/2017 3:15 PM Medical Record Number: [id number] Patient Account Number: [id number] Date of Birth/Sex: 05/06/1946 (71 y.o. Female) Treating RN: Curtis Sites Primary Care Dolly Harbach: Milon Score Other Clinician: Referring Arbell Wycoff: Referral, Self Treating Hiilani Jetter/Extender: Rudene Re in Treatment: 4 Vascular Assessment Pulses: Dorsalis Pedis Palpable: [Right:Yes] Posterior Tibial Extremity colors, hair growth, and conditions: Extremity Color: [Right:Hyperpigmented] Hair Growth on Extremity: [Right:No] Temperature of Extremity: [Right:Warm] Capillary Refill: [Right:< 3 seconds] Electronic Signature(s) Signed: 03/20/2017 4:41:02 PM By: Curtis Sites Entered By: Curtis Sites on 03/20/2017 15:31:05 [name] ([id number]) -------------------------------------------------------------------------------- Multi Wound Chart Details [name] Date of Service: 03/20/2017 3:15 PM Medical Record Number: [id number] Patient Account Number: [id number] Date of Birth/Sex: 1946/05/18 (71 y.o. Female) Treating RN: Curtis Sites Primary Care Kathe Wirick: Milon Score Other Clinician: Referring Kristee Angus: Referral,  Self Treating Azriella Mattia/Extender: Rudene Re in Treatment: 4 Vital Signs Height(in): 67 Pulse(bpm): 79 Weight(lbs): 202 Blood Pressure 149/62 (mmHg): Body Mass Index(BMI): 32 Temperature(F): 98.3 Respiratory Rate 16 (breaths/min): Photos: [2:No Photos] [3:No Photos] [N/A:N/A] Wound Location: [2:Right Lower Leg - Anterior, Distal] [3:Right Lower Leg - Anterior, Proximal] [N/A:N/A] Wounding Event: [2:Gradually Appeared] [3:Gradually Appeared] [N/A:N/A] Primary Etiology: [2:Diabetic Wound/Ulcer of the Lower Extremity] [3:Diabetic Wound/Ulcer of the Lower Extremity] [N/A:N/A] Comorbid History: [2:Chronic Obstructive Pulmonary Disease (COPD), Hypertension, Type II Diabetes] [3:Chronic Obstructive Pulmonary  Disease (COPD), Hypertension, Type II Diabetes] [N/A:N/A] Date Acquired: [2:02/14/2017] [3:02/14/2017] [N/A:N/A] Weeks of Treatment: [2:4] [3:4] [N/A:N/A] Wound Status: [2:Open] [3:Open] [N/A:N/A] Measurements L x W x D 0.1x0.1x0.1 [3:0.1x0.1x0.1] [N/A:N/A] (cm) Area (cm) : [2:0.008] [3:0.008] [N/A:N/A] Volume (cm) : [2:0.001] [3:0.001] [N/A:N/A] % Reduction in Area: [2:50.00%] [3:50.00%] [N/A:N/A] % Reduction in Volume: 66.70% [3:50.00%] [N/A:N/A] Classification: [2:Grade 1] [3:Grade 1] [N/A:N/A] Exudate Amount: [2:Small] [3:Small] [N/A:N/A] Exudate Type: [2:Purulent] [3:Sanguinous] [N/A:N/A] Exudate Color: [2:yellow, brown, green] [3:red] [N/A:N/A] Wound Margin: [2:Distinct, outline attached] [3:Flat and Intact] [N/A:N/A] Granulation Amount: [2:None Present (0%)] [3:None Present (0%)] [N/A:N/A] Necrotic Amount: [2:None Present (0%)] [3:None Present (0%)] [N/A:N/A] Epithelialization: [2:None] [3:None] [N/A:N/A] Periwound Skin Texture: Excoriation: No [2:Induration: No Callus: No] [3:Excoriation: No Induration: No Callus: No] [N/A:N/A] Crepitus: No Crepitus: No Rash: No Rash: No Scarring: No Scarring: No Periwound Skin Maceration: No Maceration: No  N/A Moisture: Dry/Scaly: No Dry/Scaly: No Periwound Skin Color: Atrophie Blanche: No Atrophie Blanche: No N/A Cyanosis: No Cyanosis: No Ecchymosis: No Ecchymosis: No Erythema: No Erythema: No Hemosiderin Staining: No Hemosiderin Staining: No Mottled: No Mottled: No Pallor: No Pallor: No Rubor: No Rubor: No Temperature: No Abnormality N/A N/A Tenderness on Yes No N/A Palpation: Wound Preparation: Ulcer Cleansing: Ulcer Cleansing: N/A Rinsed/Irrigated with Rinsed/Irrigated with Saline Saline Topical Anesthetic Topical Anesthetic Applied: None Applied: None Treatment Notes Electronic Signature(s) Signed: 03/20/2017 4:09:31 PM By: Evlyn Kanner MD, FACS Entered By: Evlyn Kanner on 03/20/2017 16:00:40 [name] ([id number]) -------------------------------------------------------------------------------- Multi-Disciplinary Care Plan Details [name] Date of Service: 03/20/2017 3:15 PM Medical Record Number: [id number] Patient Account Number: [id number] Date of Birth/Sex: 1945/12/31 (71 y.o. Female) Treating RN: Curtis Sites Primary Care Daleisa Halperin: Milon Score Other Clinician: Referring Hannia Matchett: Referral, Self Treating Danine Hor/Extender: Rudene Re in Treatment: 4 Active Inactive ` Orientation to the Wound Care Program Nursing Diagnoses: Knowledge deficit related to the wound healing center program Goals: Patient/caregiver will verbalize understanding of the Wound Healing Center Program Date Initiated: 02/20/2017 Target Resolution Date: 03/22/2017 Goal Status: Active Interventions: Provide education on orientation to the wound center Notes: ` Pain, Acute or Chronic Nursing Diagnoses: Pain, acute or chronic: actual or potential Potential alteration in comfort, pain Goals: Patient/caregiver will verbalize adequate pain control between visits Date Initiated: 02/20/2017 Target Resolution Date: 05/24/2017 Goal Status:  Active Interventions: Complete pain assessment as per visit requirements Notes: ` Wound/Skin Impairment Nursing Diagnoses: Impaired tissue integrity Reither, Sultana C. ([id number]) Knowledge deficit related to ulceration/compromised skin integrity Goals: Ulcer/skin breakdown will have a volume reduction of 80% by week 12 Date Initiated: 02/20/2017 Target Resolution Date: 06/14/2017 Goal Status: Active Interventions: Assess patient/caregiver ability to perform ulcer/skin care regimen upon admission and as needed Assess ulceration(s) every visit Notes: Electronic Signature(s) Signed: 03/20/2017 4:41:02 PM By: Curtis Sites Entered By: Curtis Sites on 03/20/2017 15:31:13 [name] ([id number]) -------------------------------------------------------------------------------- Pain Assessment Details [name] Date of Service: 03/20/2017 3:15 PM Medical Record Number: [id number] Patient Account Number: [id number] Date of Birth/Sex: 09-25-1945 (71 y.o. Female) Treating RN: Curtis Sites Primary Care Maiko Salais: Milon Score Other Clinician: Referring Asiana Benninger: Referral, Self Treating Aishani Kalis/Extender: Rudene Re in Treatment: 4 Active Problems Location of Pain Severity and Description of Pain Patient Has Paino No Site Locations Pain Management and Medication Current Pain Management: Notes Topical or injectable lidocaine is offered to patient for acute pain when surgical debridement is performed. If needed, Patient is instructed to use over the counter pain medication for the following 24-48 hours after debridement. Wound care MDs do  not prescribed pain medications. Patient has chronic pain or uncontrolled pain. Patient has been instructed to make an appointment with their Primary Care Physician for pain management. Electronic Signature(s) Signed: 03/20/2017 4:41:02 PM By: Curtis Sites Entered By: Curtis Sites on 03/20/2017 15:22:37 [name] ([id number]) -------------------------------------------------------------------------------- Patient/Caregiver Education Details [name] Date of Service: 03/20/2017 3:15 PM Medical Record Number: [id number] Patient Account Number: [id number] Date of Birth/Gender: 11-Dec-1945 (71 y.o. Female) Treating RN: Curtis Sites Primary Care Physician: Milon Score Other Clinician: Referring Physician: Referral, Self Treating Physician/Extender: Rudene Re in Treatment: 4 Education Assessment Education Provided To: Patient Education Topics Provided Wound/Skin Impairment: Handouts: Other: wound care as ordered Methods: Demonstration, Explain/Verbal Responses: State content correctly Electronic Signature(s) Signed: 03/20/2017 4:41:02 PM By: Curtis Sites Entered By: Curtis Sites on 03/20/2017 16:15:11 [name] ([id number]) -------------------------------------------------------------------------------- Wound Assessment Details [name] Date of Service: 03/20/2017 3:15 PM Medical Record Number: [id number] Patient Account Number: [id number] Date of Birth/Sex: 09/21/1945 (71 y.o. Female) Treating RN: Curtis Sites Primary Care Huan Pollok: Milon Score Other Clinician: Referring Mada Sadik: Referral, Self Treating Kyanne Rials/Extender: Rudene Re in Treatment: 4 Wound Status Wound Number: 2 Primary Diabetic Wound/Ulcer of the Lower Etiology: Extremity Wound Location: Right Lower Leg - Anterior, Distal Wound Open Status: Wounding Event: Gradually Appeared Comorbid Chronic Obstructive Pulmonary Date Acquired: 02/14/2017 History: Disease (COPD), Hypertension, Type Weeks Of Treatment: 4 II Diabetes Clustered Wound: No Photos Photo Uploaded By: Curtis Sites on 03/20/2017 16:41:57 Wound Measurements Length: (cm) 0.1 Width: (cm) 0.1 Depth: (cm) 0.1 Area: (cm) 0.008 Volume: (cm) 0.001 % Reduction in Area: 50% %  Reduction in Volume: 66.7% Epithelialization: None Tunneling: No Undermining: No Wound Description Classification: Grade 1 Foul Odor Aft Wound Margin: Distinct, outline attached Slough/Fibrin Exudate Amount: Small Exudate Type: Purulent Exudate Color: yellow, brown, green er Cleansing: No o No Wound Bed Granulation Amount: None Present (0%) Necrotic Amount: None Present (0%) Periwound Skin Texture Hartis, [name] ([id number]) Texture Color No Abnormalities Noted: No No Abnormalities Noted: No Callus: No Atrophie Blanche: No Crepitus: No Cyanosis: No Excoriation: No Ecchymosis: No Induration: No Erythema: No Rash: No Hemosiderin Staining: No Scarring: No Mottled: No Pallor: No Moisture Rubor: No No Abnormalities Noted: No Dry / Scaly: No Temperature / Pain Maceration: No Temperature: No Abnormality Tenderness on Palpation: Yes Wound Preparation Ulcer Cleansing: Rinsed/Irrigated with Saline Topical Anesthetic Applied: None Treatment Notes Wound #2 (Right, Distal, Anterior Lower Leg) 1. Cleansed with: Clean wound with Normal Saline 2. Anesthetic Topical Lidocaine 4% cream to wound bed prior to debridement 4. Dressing Applied: Foam 5. Secondary Dressing Applied Kerlix/Conform 7. Secured with Tape Notes netting Electronic Signature(s) Signed: 03/20/2017 4:41:02 PM By: Curtis Sites Entered By: Curtis Sites on 03/20/2017 15:30:22 [name], [name] ([id number]) -------------------------------------------------------------------------------- Wound Assessment Details [name] Date of Service: 03/20/2017 3:15 PM Medical Record Number: [id number] Patient Account Number: [id number] Date of Birth/Sex: 07-Nov-1945 (71 y.o. Female) Treating RN: Curtis Sites Primary Care Jorene Kaylor: Milon Score Other Clinician: Referring Reshanda Lewey: Referral, Self Treating Wilmore Holsomback/Extender: Rudene Re in Treatment: 4 Wound Status Wound Number: 3  Primary Diabetic Wound/Ulcer of the Lower Etiology: Extremity Wound Location: Right Lower Leg - Anterior, Proximal Wound Open Status: Wounding Event: Gradually Appeared Comorbid Chronic Obstructive Pulmonary Date Acquired: 02/14/2017 History: Disease (COPD), Hypertension, Type Weeks Of Treatment: 4 II Diabetes Clustered Wound: No Photos Photo Uploaded By: Curtis Sites on 03/20/2017 16:41:57 Wound Measurements Length: (cm) 0.1 Width: (cm) 0.1 Depth: (cm) 0.1 Area: (cm)  0.008 Volume: (cm) 0.001 % Reduction in Area: 50% % Reduction in Volume: 50% Epithelialization: None Tunneling: No Undermining: No Wound Description Classification: Grade 1 Wound Margin: Flat and Intact Exudate Amount: Small Exudate Type: Sanguinous Exudate Color: red Foul Odor After Cleansing: No Slough/Fibrino No Wound Bed Granulation Amount: None Present (0%) Exposed Structure Necrotic Amount: None Present (0%) Fascia Exposed: No Fat Layer (Subcutaneous Tissue) Exposed: No [name], [name] ([id number]) Tendon Exposed: No Muscle Exposed: No Joint Exposed: No Bone Exposed: No Periwound Skin Texture Texture Color No Abnormalities Noted: No No Abnormalities Noted: No Callus: No Atrophie Blanche: No Crepitus: No Cyanosis: No Excoriation: No Ecchymosis: No Induration: No Erythema: No Rash: No Hemosiderin Staining: No Scarring: No Mottled: No Pallor: No Moisture Rubor: No No Abnormalities Noted: No Dry / Scaly: No Maceration: No Wound Preparation Ulcer Cleansing: Rinsed/Irrigated with Saline Topical Anesthetic Applied: None Treatment Notes Wound #3 (Right, Proximal, Anterior Lower Leg) 1. Cleansed with: Clean wound with Normal Saline 2. Anesthetic Topical Lidocaine 4% cream to wound bed prior to debridement 4. Dressing Applied: Foam 5. Secondary Dressing Applied Kerlix/Conform 7. Secured with Tape Notes netting Electronic Signature(s) Signed: 03/20/2017 4:41:02 PM By:  Curtis Sites Entered By: Curtis Sites on 03/20/2017 15:30:06 AAMIRAH, SALMI ([id number]) -------------------------------------------------------------------------------- Vitals Details [name] Date of Service: 03/20/2017 3:15 PM Medical Record Number: [id number] Patient Account Number: [id number] Date of Birth/Sex: 1945-08-31 (71 y.o. Female) Treating RN: Curtis Sites Primary Care Vertie Dibbern: Milon Score Other Clinician: Referring Shawneen Deetz: Referral, Self Treating Guthrie Lemme/Extender: Rudene Re in Treatment: 4 Vital Signs Time Taken: 15:28 Temperature (F): 98.3 Height (in): 67 Pulse (bpm): 79 Weight (lbs): 202 Respiratory Rate (breaths/min): 16 Body Mass Index (BMI): 31.6 Blood Pressure (mmHg): 149/62 Reference Range: 80 - 120 mg / dl Electronic Signature(s) Signed: 03/20/2017 4:41:02 PM By: Curtis Sites Entered By: Curtis Sites on 03/20/2017 15:28:44

## 2017-03-22 NOTE — Progress Notes (Signed)
[name], [name] ([id number]) Visit Report for 03/20/2017 Chief Complaint Document Details [name] Date of Service: 03/20/2017 3:15 PM Medical Record Number: [id number] Patient Account Number: [id number] Date of Birth/Sex: 08/04/1945 (71 y.o. Female) Treating RN: [name] Primary Care Provider: Milon [name] Other Clinician: Referring Provider: Referral, Self Treating Provider/Extender: [name] in Treatment: 4 Information Obtained from: Patient Chief Complaint patient is here for follow-up evaluation of a right lower extremity ulcer Electronic Signature(s) Signed: 03/20/2017 4:09:31 PM By: Janet Kanner MD, FACS Entered By: [name] on 03/20/2017 16:00:50 [name] ([id number]) -------------------------------------------------------------------------------- HPI Details [name] Date of Service: 03/20/2017 3:15 PM Medical Record Number: [id number] Patient Account Number: [id number] Date of Birth/Sex: 1945/12/19 (71 y.o. Female) Treating RN: [name] Primary Care Provider: Milon [name] Other Clinician: Referring Provider: Referral, Self Treating Provider/Extender: [name] in Treatment: 4 History of Present Illness Location: right lower extremity in the area of the lateral calf below the knee Quality: She is having no pain Context: The wound occurred when the patient a blunt injury and fall injury Modifying Factors: None as patient was healed Associated Signs and Symptoms: Patient reports having increase discharge. HPI Description: Readmission: 02/20/17 on evaluation today patient's wound which had previously healed in June 2018 unfortunately had two areas that began to drain in the past week. She therefore called to be reevaluated in regard to this wound. At the location of the healed wound server she has two panel locations one at 9 o'clock and one at 6 o'clock it appeared to be draining fluid and it  seems that she did not fully heal underneath the wound as this close. This therefore trapped fluid and now has reopened. Fortunately it seems to be mild and I think this will likely close and heal very quickly although obviously this is frustrating for the patient and she thought that she was done with this wound. No fevers, chills, nausea, or vomiting noted at this time. Patient is having no pain. 02/27/17 on evaluation today patient's right lower extremity ulcer appears to be doing about the same. We have not made a lot of improvements. Fortunately we did get the results of her culture back which showed no growth this was good news. Again I was not overly convinced that this would be infected but nonetheless we wanted to be sure just considering how it was progressing. Fortunately she has no significant pain other than with dressing changes and then it's around a two out of 10. No fevers, chills, nausea, or vomiting noted at this time. 03/07/2017 -- this patient was discharged on 12/27/2016 has not been wearing her compression stockings as advised but has been wearing TED hose which are basically useless for a problem. He continues to smoke about half pack of cigarettes a day ======== Old Notes 71 year old patient has been referred to as with right lower extremity wound with edema and ecchymosis, after having a injury due to a fall on 08/20/2016. she has a history of diabetes mellitus type 2 which is uncomplicated, chronic back pain, COPD, hypertension, hypothyroidism, supraventricular tachycardia and tobacco abuse. He is status post neck surgery, appendectomy, cholecystectomy, hernia repair. He smokes about half a pack of cigarettes a day for the last 50 years. Recently on doxycycline for 10 days and was given symptomatic treatment for wound. 10/04/2016 -- the patient tolerated the snap vacuum system well and had no significant problems. She continues to smoke and is trying to give  it  up. 10/11/2016 -- she continues to tolerate the snap like him system fairly well and other than that she has no Ramey, [name] ([id number]) fresh issues 11/01/16- she is here for follow-up evaluation of her right lower extremity ulcer. She is tolerating the SNAP negative pressure system. She is voicing no complaints of pain. She states her glucose levels are consistently less than 150 with her evening glucose levels being higher than morning glucose levels. She continues to smoke, approximately 0.5-ppd which is a reduction of 1-ppd approximately 6 months ago. 11/08/16 patient on evaluation today fortunately does not appear to be having as much discomfort as she has in the past. She is tolerating the dressing changes well although she does note some irritation where the wound VAC has been applied. She specifically wonders if we could see about taking a break possibly progressing to discontinuation of the wound VAC. There is no evidence of infection. 12/20/2016 -- we had prepared to use a Grafix skin substitute today but her wound had improved remarkably and we did not use this ===== Electronic Signature(s) Signed: 03/20/2017 4:09:31 PM By: Janet KannerBritto, Kiandra Sanguinetti MD, FACS Entered By: Janet KannerBritto, Tyaira Heward on 03/20/2017 16:00:55 [name], [name] ([id number]) -------------------------------------------------------------------------------- Physical Exam Details [name], [name] Date of Service: 03/20/2017 3:15 PM Medical Record Number: [id number] Patient Account Number: [id number] Date of Birth/Sex: January 15, 1946 [age] y.o. Female) Treating RN: Janet Sitesorthy, [name] Primary Care Provider: Milon ScoreJONES, CARON Other Clinician: Referring Provider: Referral, Self Treating Provider/Extender: Janet ReBritto, Rynlee Lisbon Weeks in Treatment: 4 Constitutional . Pulse regular. Respirations normal and unlabored. Afebrile. . Eyes Nonicteric. Reactive to light. Ears, Nose, Mouth, and Throat Lips, teeth, and gums WNL.Marland Kitchen. Moist mucosa without  lesions. Neck supple and nontender. No palpable supraclavicular or cervical adenopathy. Normal sized without goiter. Respiratory WNL. No retractions.. Breath sounds WNL, No rubs, rales, rhonchi, or wheeze.. Cardiovascular Heart rhythm and rate regular, no murmur or gallop.. Pedal Pulses WNL. No clubbing, cyanosis or edema. Chest Breasts symmetical and no nipple discharge.. Breast tissue WNL, no masses, lumps, or tenderness.. Lymphatic No adneopathy. No adenopathy. No adenopathy. Musculoskeletal Adexa without tenderness or enlargement.. Digits and nails w/o clubbing, cyanosis, infection, petechiae, ischemia, or inflammatory conditions.. Integumentary (Hair, Skin) No suspicious lesions. No crepitus or fluctuance. No peri-wound warmth or erythema. No masses.Marland Kitchen. Psychiatric Judgement and insight Intact.. No evidence of depression, anxiety, or agitation.. Notes she has to sinus tracts one at the 6:00 position and one at the 9:00 position which drain a bit of lymph fluid. There is no surrounding cellulitis or evidence of inflammation. Electronic Signature(s) Signed: 03/20/2017 4:09:31 PM By: Janet KannerBritto, Jahniah Pallas MD, FACS Entered By: Janet KannerBritto, Tomasz Steeves on 03/20/2017 16:01:28 [name], [name] ([id number]) -------------------------------------------------------------------------------- Physician Orders Details [name], [name] Date of Service: 03/20/2017 3:15 PM Medical Record Number: [id number] Patient Account Number: [id number] Date of Birth/Sex: January 15, 1946 31(71 y.o. Female) Treating RN: Janet Sitesorthy, [name] Primary Care Provider: Milon ScoreJONES, CARON Other Clinician: Referring Provider: Referral, Self Treating Provider/Extender: Janet ReBritto, Cree Kunert Weeks in Treatment: 4 Verbal / Phone Orders: No Diagnosis Coding Wound Cleansing Wound #2 Right,Distal,Anterior Lower Leg o Clean wound with Normal Saline. o Cleanse wound with mild soap and water Wound #3 Right,Proximal,Anterior Lower Leg o Clean wound  with Normal Saline. o Cleanse wound with mild soap and water Anesthetic Wound #2 Right,Distal,Anterior Lower Leg o Topical Lidocaine 4% cream applied to wound bed prior to debridement Wound #3 Right,Proximal,Anterior Lower Leg o Topical Lidocaine 4% cream applied to wound bed prior to debridement Skin Barriers/Peri-Wound Care Wound #2  Right,Distal,Anterior Lower Leg o Skin Prep Wound #3 Right,Proximal,Anterior Lower Leg o Skin Prep Primary Wound Dressing Wound #2 Right,Distal,Anterior Lower Leg o Foam Wound #3 Right,Proximal,Anterior Lower Leg o Foam Secondary Dressing Wound #2 Right,Distal,Anterior Lower Leg o Dry Gauze - bolster o Conform/Kerlix Wound #3 Right,Proximal,Anterior Lower Leg Alcock, Norena C. ([id number]) o Dry Gauze - bolster o Conform/Kerlix Dressing Change Frequency Wound #2 Right,Distal,Anterior Lower Leg o Change dressing every other day. Wound #3 Right,Proximal,Anterior Lower Leg o Change dressing every other day. Follow-up Appointments Wound #2 Right,Distal,Anterior Lower Leg o Return Appointment in 1 week. Wound #3 Right,Proximal,Anterior Lower Leg o Return Appointment in 1 week. Edema Control Wound #2 Right,Distal,Anterior Lower Leg o Support Garment 10-20 mm/Hg pressure to: Wound #3 Right,Proximal,Anterior Lower Leg o Support Garment 10-20 mm/Hg pressure to: Additional Orders / Instructions Wound #2 Right,Distal,Anterior Lower Leg o Increase protein intake. Wound #3 Right,Proximal,Anterior Lower Leg o Increase protein intake. Electronic Signature(s) Signed: 03/20/2017 4:09:31 PM By: Janet Kanner MD, FACS Signed: 03/20/2017 4:41:02 PM By: [name] Entered By: [name] on 03/20/2017 15:39:40 [name], [name] ([id number]) -------------------------------------------------------------------------------- Problem List Details [name] Date of Service: 03/20/2017 3:15 PM Medical  Record Number: [id number] Patient Account Number: [id number] Date of Birth/Sex: 12-09-1945 (71 y.o. 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 F17.218 Nicotine dependence, cigarettes, with other nicotine- 02/20/2017 Yes induced disorders I89.0 Lymphedema, not elsewhere classified 03/07/2017 Yes Inactive Problems Resolved Problems Electronic Signature(s) Signed: 03/20/2017 4:09:31 PM By: Janet Kanner MD, FACS Entered By: [name] on 03/20/2017 16:00:34 [name] ([id number]) -------------------------------------------------------------------------------- Progress Note Details [name] Date of Service: 03/20/2017 3:15 PM Medical Record Number: [id number] Patient Account Number: [id number] Date of Birth/Sex: Mar 12, 1946 (71 y.o. Female) Treating RN: [name] Primary Care Provider: Milon [name] Other Clinician: Referring Provider: Referral, Self Treating Provider/Extender: [name] in Treatment: 4 Subjective Chief Complaint Information obtained from Patient patient is here for follow-up evaluation of a right lower extremity ulcer History of Present Illness (HPI) The following HPI elements were documented for the patient's wound: Location: right lower extremity in the area of the lateral calf below the knee Quality: She is having no pain Context: The wound occurred when the patient a blunt injury and fall injury Modifying Factors: None as patient was healed Associated Signs and Symptoms: Patient reports having increase discharge. Readmission: 02/20/17 on evaluation today  patient's wound which had previously healed in June 2018 unfortunately had two areas that began to drain in the past week. She therefore called to be reevaluated in regard to this wound. At the location of the healed wound server she has two panel locations one at 9 o'clock and one at 6 o'clock it appeared to be draining fluid and it seems that she did not fully heal underneath the wound as this close. This therefore trapped fluid and now has reopened. Fortunately it seems to be mild and I think this will likely close and heal very quickly although obviously this is frustrating for the patient and she thought that she was done with this wound. No fevers, chills, nausea, or vomiting noted at this time. Patient is having no pain. 02/27/17 on evaluation today patient's right lower  extremity ulcer appears to be doing about the same. We have not made a lot of improvements. Fortunately we did get the results of her culture back which showed no growth this was good news. Again I was not overly convinced that this would be infected but nonetheless we wanted to be sure just considering how it was progressing. Fortunately she has no significant pain other than with dressing changes and then it's around a two out of 10. No fevers, chills, nausea, or vomiting noted at this time. 03/07/2017 -- this patient was discharged on 12/27/2016 has not been wearing her compression stockings as advised but has been wearing TED hose which are basically useless for a problem. He continues to smoke about half pack of cigarettes a day ======== Old Notes 71 year old patient has been referred to as with right lower extremity wound with edema and ecchymosis, after having a injury due to a fall on 08/20/2016. she has a history of diabetes mellitus type 2 which is [name], [name] ([id number]) uncomplicated, chronic back pain, COPD, hypertension, hypothyroidism, supraventricular tachycardia and tobacco abuse. He is status post  neck surgery, appendectomy, cholecystectomy, hernia repair. He smokes about half a pack of cigarettes a day for the last 50 years. Recently on doxycycline for 10 days and was given symptomatic treatment for wound. 10/04/2016 -- the patient tolerated the snap vacuum system well and had no significant problems. She continues to smoke and is trying to give it up. 10/11/2016 -- she continues to tolerate the snap like him system fairly well and other than that she has no fresh issues 11/01/16- she is here for follow-up evaluation of her right lower extremity ulcer. She is tolerating the SNAP negative pressure system. She is voicing no complaints of pain. She states her glucose levels are consistently less than 150 with her evening glucose levels being higher than morning glucose levels. She continues to smoke, approximately 0.5-ppd which is a reduction of 1-ppd approximately 6 months ago. 11/08/16 patient on evaluation today fortunately does not appear to be having as much discomfort as she has in the past. She is tolerating the dressing changes well although she does note some irritation where the wound VAC has been applied. She specifically wonders if we could see about taking a break possibly progressing to discontinuation of the wound VAC. There is no evidence of infection. 12/20/2016 -- we had prepared to use a Grafix skin substitute today but her wound had improved remarkably and we did not use this ===== Objective Constitutional Pulse regular. Respirations normal and unlabored. Afebrile. Vitals Time Taken: 3:28 PM, Height: 67 in, Weight: 202 lbs, BMI: 31.6, Temperature: 98.3 F, Pulse: 79 bpm, Respiratory Rate: 16 breaths/min, Blood Pressure: 149/62 mmHg. Eyes Nonicteric. Reactive to light. Ears, Nose, Mouth, and Throat Lips, teeth, and gums WNL.Marland Kitchen Moist mucosa without lesions. Neck supple and nontender. No palpable supraclavicular or cervical adenopathy. Normal sized without  goiter. [name], [name] ([id number]) Respiratory WNL. No retractions.. Breath sounds WNL, No rubs, rales, rhonchi, or wheeze.. Cardiovascular Heart rhythm and rate regular, no murmur or gallop.. Pedal Pulses WNL. No clubbing, cyanosis or edema. Chest Breasts symmetical and no nipple discharge.. Breast tissue WNL, no masses, lumps, or tenderness.. Lymphatic No adneopathy. No adenopathy. No adenopathy. Musculoskeletal Adexa without tenderness or enlargement.. Digits and nails w/o clubbing, cyanosis, infection, petechiae, ischemia, or inflammatory conditions.Marland Kitchen Psychiatric Judgement and insight Intact.. No evidence of depression, anxiety, or agitation.. General Notes: she has to sinus tracts one at the 6:00 position  and one at the 9:00 position which drain a bit of lymph fluid. There is no surrounding cellulitis or evidence of inflammation. Integumentary (Hair, Skin) No suspicious lesions. No crepitus or fluctuance. No peri-wound warmth or erythema. No masses.. Wound #2 status is Open. Original cause of wound was Gradually Appeared. The wound is located on the Right,Distal,Anterior Lower Leg. The wound measures 0.1cm length x 0.1cm width x 0.1cm depth; 0.008cm^2 area and 0.001cm^3 volume. There is no tunneling or undermining noted. There is a small amount of purulent drainage noted. The wound margin is distinct with the outline attached to the wound base. There is no granulation within the wound bed. There is no necrotic tissue within the wound bed. The periwound skin appearance did not exhibit: Callus, Crepitus, Excoriation, Induration, Rash, Scarring, Dry/Scaly, Maceration, Atrophie Blanche, Cyanosis, Ecchymosis, Hemosiderin Staining, Mottled, Pallor, Rubor, Erythema. Periwound temperature was noted as No Abnormality. The periwound has tenderness on palpation. Wound #3 status is Open. Original cause of wound was Gradually Appeared. The wound is located on the Right,Proximal,Anterior Lower  Leg. The wound measures 0.1cm length x 0.1cm width x 0.1cm depth; 0.008cm^2 area and 0.001cm^3 volume. There is no tunneling or undermining noted. There is a small amount of sanguinous drainage noted. The wound margin is flat and intact. There is no granulation within the wound bed. There is no necrotic tissue within the wound bed. The periwound skin appearance did not exhibit: Callus, Crepitus, Excoriation, Induration, Rash, Scarring, Dry/Scaly, Maceration, Atrophie Blanche, Cyanosis, Ecchymosis, Hemosiderin Staining, Mottled, Pallor, Rubor, Erythema. Assessment [name], [name] ([id number]) Active Problems ICD-10 E11.622 - Type 2 diabetes mellitus with other skin ulcer L97.212 - Non-pressure chronic ulcer of right calf with fat layer exposed F17.218 - Nicotine dependence, cigarettes, with other nicotine-induced disorders I89.0 - Lymphedema, not elsewhere classified Plan Wound Cleansing: Wound #2 Right,Distal,Anterior Lower Leg: Clean wound with Normal Saline. Cleanse wound with mild soap and water Wound #3 Right,Proximal,Anterior Lower Leg: Clean wound with Normal Saline. Cleanse wound with mild soap and water Anesthetic: Wound #2 Right,Distal,Anterior Lower Leg: Topical Lidocaine 4% cream applied to wound bed prior to debridement Wound #3 Right,Proximal,Anterior Lower Leg: Topical Lidocaine 4% cream applied to wound bed prior to debridement Skin Barriers/Peri-Wound Care: Wound #2 Right,Distal,Anterior Lower Leg: Skin Prep Wound #3 Right,Proximal,Anterior Lower Leg: Skin Prep Primary Wound Dressing: Wound #2 Right,Distal,Anterior Lower Leg: Foam Wound #3 Right,Proximal,Anterior Lower Leg: Foam Secondary Dressing: Wound #2 Right,Distal,Anterior Lower Leg: Dry Gauze - bolster Conform/Kerlix Wound #3 Right,Proximal,Anterior Lower Leg: Dry Gauze - bolster Conform/Kerlix Dressing Change Frequency: Wound #2 Right,Distal,Anterior Lower Leg: Change dressing every other  day. Wound #3 Right,Proximal,Anterior Lower Leg: Change dressing every other day. Follow-up Appointments: [name], [name] ([id number]) Wound #2 Right,Distal,Anterior Lower Leg: Return Appointment in 1 week. Wound #3 Right,Proximal,Anterior Lower Leg: Return Appointment in 1 week. Edema Control: Wound #2 Right,Distal,Anterior Lower Leg: Support Garment 10-20 mm/Hg pressure to: Wound #3 Right,Proximal,Anterior Lower Leg: Support Garment 10-20 mm/Hg pressure to: Additional Orders / Instructions: Wound #2 Right,Distal,Anterior Lower Leg: Increase protein intake. Wound #3 Right,Proximal,Anterior Lower Leg: Increase protein intake. I have recommended: 1. Elevation and exercise 2. Compression stockings of the 20-30 mm variety to be worn every day according to a strict schedule 3. Foam and a Kerlix and Coban dressing over that area to have firm compression and obliterate the dead space in this region 4. I have again discussed with her the need to completely give up smoking and have urged her to be compliant 5. I will see her  back next week Electronic Signature(s) Signed: 03/20/2017 4:09:31 PM By: Janet Kanner MD, FACS Entered By: [name] on 03/20/2017 16:02:06 LAKERIA, STARKMAN ([id number]) -------------------------------------------------------------------------------- SuperBill Details [name] Date of Service: 03/20/2017 Medical Record Number: [id number] Patient Account Number: [id number] Date of Birth/Sex: May 09, 1946 (71 y.o. Female) Treating RN: [name] Primary Care Provider: Milon [name] Other Clinician: Referring Provider: Referral, Self Treating Provider/Extender: [name] in Treatment: 4 Diagnosis Coding ICD-10 Codes Code Description E11.622 Type 2 diabetes mellitus with other skin ulcer L97.212 Non-pressure chronic ulcer of right calf with fat layer exposed F17.218 Nicotine dependence, cigarettes, with other nicotine-induced  disorders I89.0 Lymphedema, not elsewhere classified Facility Procedures CPT4 Code: [id number] Description: 99213 - WOUND CARE VISIT-LEV 3 EST PT Modifier: Quantity: 1 Physician Procedures CPT4 Code Description: [id number] 99213 - WC PHYS LEVEL 3 - EST PT ICD-10 Description Diagnosis E11.622 Type 2 diabetes mellitus with other skin ulcer L97.212 Non-pressure chronic ulcer of right calf with fat l F17.218 Nicotine dependence, cigarettes, with  other nicotin I89.0 Lymphedema, not elsewhere classified Modifier: ayer exposed e-induced di Quantity: 1 sorders Electronic Signature(s) Signed: 03/20/2017 4:14:10 PM By: [name] Signed: 03/21/2017 3:48:14 PM By: Janet Kanner MD, FACS Previous Signature: 03/20/2017 4:09:31 PM Version By: Janet Kanner MD, FACS Entered By: [name] on 03/20/2017 16:14:10

## 2017-03-27 ENCOUNTER — Ambulatory Visit: Payer: Medicare Other | Admitting: Surgery

## 2017-04-03 ENCOUNTER — Encounter: Payer: Medicare Other | Admitting: Surgery

## 2017-04-03 DIAGNOSIS — E11622 Type 2 diabetes mellitus with other skin ulcer: Secondary | ICD-10-CM | POA: Diagnosis not present

## 2017-04-06 NOTE — Progress Notes (Signed)
Janet, [name] ([id number]) Visit Report for 04/03/2017 Chief Complaint Document Details Patient Name: Janet, [name] Date of Service: 04/03/2017 1:30 PM Medical Record Number: [id number] Patient Account Number: [id number] Date of Birth/Sex: April 21, 1946 (71 y.o. Female) Treating RN: [name], [name] Primary Care Provider: Milon [name] Other Clinician: Referring Provider: Referral, Self Treating Provider/Extender: [name] in Treatment: 6 Information Obtained from: Patient Chief Complaint patient is here for follow-up evaluation of a right lower extremity ulcer Electronic Signature(s) Signed: 04/03/2017 2:14:22 PM By: Janet Kanner MD, FACS Entered By: [name] on 04/03/2017 13:33:56 [name] ([id number]) -------------------------------------------------------------------------------- HPI Details [name] Date of Service: 04/03/2017 1:30 PM Medical Record Number: [id number] Patient Account Number: [id number] Date of Birth/Sex: Jun 26, 1946 (71 y.o. Female) Treating RN: [name], [name] Primary Care Provider: Milon [name] Other Clinician: Referring Provider: Referral, Self Treating Provider/Extender: [name] in Treatment: 6 History of Present Illness Location: right lower extremity in the area of the lateral calf below the knee Quality: She is having no pain Context: The wound occurred when the patient a blunt injury and fall injury Modifying Factors: None as patient was healed Associated Signs and Symptoms: Patient reports having increase discharge. HPI Description: Readmission: 02/20/17 on evaluation today patient's wound which had previously healed in June 2018 unfortunately had two areas that began to drain in the past week. She therefore called to be reevaluated in regard to this wound. At the location of the healed wound server she has two panel locations one at 9 o'clock and one at 6 o'clock it appeared to be draining fluid  and it seems that she did not fully heal underneath the wound as this close. This therefore trapped fluid and now has reopened. Fortunately it seems to be mild and I think this will likely close and heal very quickly although obviously this is frustrating for the patient and she thought that she was done with this wound. No fevers, chills, nausea, or vomiting noted at this time. Patient is having no pain. 02/27/17 on evaluation today patient's right lower extremity ulcer appears to be doing about the same. We have not made a lot of improvements. Fortunately we did get the results of her culture back which showed no growth this was good news. Again I was not overly convinced that this would be infected but nonetheless we wanted to be sure just considering how it was progressing. Fortunately she has no significant pain other than with dressing changes and then it's around a two out of 10. No fevers, chills, nausea, or vomiting noted at this time. 03/07/2017 -- this patient was discharged on 12/27/2016 has not been wearing her compression stockings as advised but has been wearing TED hose which are basically useless for a problem. [name] continues to smoke about half pack of cigarettes a day ======== Old Notes 71 year old patient has been referred to as with right lower extremity wound with edema and ecchymosis, after having a injury [name] to a fall on 08/20/2016. she has a history of diabetes mellitus type 2 which is uncomplicated, chronic back pain, COPD, hypertension, hypothyroidism, supraventricular tachycardia and tobacco abuse. [name] is status post neck surgery, appendectomy, cholecystectomy, hernia repair. [name] smokes about half a pack of cigarettes a day for the last 50 years. Recently on doxycycline for 10 days and was given symptomatic treatment for wound. 10/04/2016 -- the patient tolerated the snap vacuum system well and had no significant problems. She continues to smoke and is trying to give  it  up. 10/11/2016 -- she continues to tolerate the snap like him system fairly well and other than that she has no Housey, [name] ([id number]) fresh issues 11/01/16- she is here for follow-up evaluation of her right lower extremity ulcer. She is tolerating the SNAP negative pressure system. She is voicing no complaints of pain. She states her glucose levels are consistently less than 150 with her evening glucose levels being higher than morning glucose levels. She continues to smoke, approximately 0.5-ppd which is a reduction of 1-ppd approximately 6 months ago. 11/08/16 patient on evaluation today fortunately does not appear to be having as much discomfort as she has in the past. She is tolerating the dressing changes well although she does note some irritation where the wound VAC has been applied. She specifically wonders if we could see about taking a break possibly progressing to discontinuation of the wound VAC. There is no evidence of infection. 12/20/2016 -- we had prepared to use a Grafix skin substitute today but her wound had improved remarkably and we did not use this ===== Electronic Signature(s) Signed: 04/03/2017 2:14:22 PM By: Janet Kanner MD, FACS Entered By: [name] on 04/03/2017 13:34:01 [name] ([id number]) -------------------------------------------------------------------------------- Physical Exam Details [name] Date of Service: 04/03/2017 1:30 PM Medical Record Number: [id number] Patient Account Number: [id number] Date of Birth/Sex: April 07, 1946 (71 y.o. Female) Treating RN: [name], [name] Primary Care Provider: Milon [name] Other Clinician: Referring Provider: Referral, Self Treating Provider/Extender: [name] in Treatment: 6 Constitutional . Pulse regular. Respirations normal and unlabored. Afebrile. . Eyes Nonicteric. Reactive to light. Ears, Nose, Mouth, and Throat Lips, teeth, and gums WNL.Marland Kitchen Moist mucosa without  lesions. Neck supple and nontender. No palpable supraclavicular or cervical adenopathy. Normal sized without goiter. Respiratory WNL. No retractions.. Cardiovascular Pedal Pulses WNL. No clubbing, cyanosis or edema. Genitourinary (GU) No hydrocele, spermatocele, tenderness of the cord, or testicular mass.Marland Kitchen Penis without lesions.Renetta Chalk without lesions. No cystocele, or rectocele. Pelvic support intact, no discharge.Marland Kitchen Urethra without masses, tenderness or scarring.Marland Kitchen Lymphatic No adneopathy. No adenopathy. No adenopathy. Musculoskeletal Adexa without tenderness or enlargement.. Digits and nails w/o clubbing, cyanosis, infection, petechiae, ischemia, or inflammatory conditions.. Integumentary (Hair, Skin) No suspicious lesions. No crepitus or fluctuance. No peri-wound warmth or erythema. No masses.Marland Kitchen Psychiatric Judgement and insight Intact.. No evidence of depression, anxiety, or agitation.. Notes both the sinus tracts at the 6 and 9:00 positions seem to have healed and there was no drainage of fluid. Electronic Signature(s) Signed: 04/03/2017 2:14:22 PM By: Janet Kanner MD, FACS Entered By: [name] on 04/03/2017 13:34:40 [name], [name] ([id number]) -------------------------------------------------------------------------------- Physician Orders Details [name] Date of Service: 04/03/2017 1:30 PM Medical Record Number: [id number] Patient Account Number: [id number] Date of Birth/Sex: Sep 02, 1945 (71 y.o. Female) Treating RN: [name], [name] Primary Care Provider: Milon [name] Other Clinician: Referring Provider: Referral, Self Treating Provider/Extender: [name] in Treatment: 6 Verbal / Phone Orders: Yes Clinician: Ashok [name], [name] Read Back and Verified: Yes Diagnosis Coding Wound Cleansing Wound #2 Right,Distal,Anterior Lower Leg o Clean wound with Normal Saline. o Cleanse wound with mild soap and water Wound #3  Right,Proximal,Anterior Lower Leg o Clean wound with Normal Saline. o Cleanse wound with mild soap and water Anesthetic Wound #2 Right,Distal,Anterior Lower Leg o Topical Lidocaine 4% cream applied to wound bed prior to debridement Wound #3 Right,Proximal,Anterior Lower Leg o Topical Lidocaine 4% cream applied to wound bed prior to debridement Skin Barriers/Peri-Wound Care Wound #2 Right,Distal,Anterior Lower Leg   o Skin Prep Wound #3 Right,Proximal,Anterior Lower Leg o Skin Prep Primary Wound Dressing Wound #2 Right,Distal,Anterior Lower Leg o Foam Wound #3 Right,Proximal,Anterior Lower Leg o Foam Secondary Dressing Wound #2 Right,Distal,Anterior Lower Leg o Dry Gauze - bolster o Conform/Kerlix Wound #3 Right,Proximal,Anterior Lower Leg Sermeno, Arnelle C. ([id number]) o Dry Gauze - bolster o Conform/Kerlix Dressing Change Frequency Wound #2 Right,Distal,Anterior Lower Leg o Change dressing every other day. Wound #3 Right,Proximal,Anterior Lower Leg o Change dressing every other day. Follow-up Appointments Wound #2 Right,Distal,Anterior Lower Leg o Return Appointment in 1 week. Wound #3 Right,Proximal,Anterior Lower Leg o Return Appointment in 1 week. Edema Control Wound #2 Right,Distal,Anterior Lower Leg o Support Garment 10-20 mm/Hg pressure to: Wound #3 Right,Proximal,Anterior Lower Leg o Support Garment 10-20 mm/Hg pressure to: Additional Orders / Instructions Wound #2 Right,Distal,Anterior Lower Leg o Increase protein intake. Wound #3 Right,Proximal,Anterior Lower Leg o Increase protein intake. Electronic Signature(s) Signed: 04/03/2017 2:14:22 PM By: Janet Kanner MD, FACS Signed: 04/04/2017 5:11:04 PM By: Alejandro Mulling Entered By: Alejandro Mulling on 04/03/2017 13:32:30 [name], [name] ([id number]) -------------------------------------------------------------------------------- Problem List Details [name] Date of Service: 04/03/2017 1:30 PM Medical Record Number: [id number] Patient Account Number: [id number] Date of Birth/Sex: September 16, 1945 (71 y.o. 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 F17.218 Nicotine dependence, cigarettes, with other nicotine- 02/20/2017 Yes induced disorders I89.0 Lymphedema, not elsewhere classified 03/07/2017 Yes Inactive Problems Resolved Problems Electronic Signature(s) Signed: 04/03/2017 2:14:22 PM By: Janet Kanner MD, FACS Entered By: [name] on 04/03/2017 13:33:38 [name] ([id number]) -------------------------------------------------------------------------------- Progress Note Details [name] Date of Service: 04/03/2017 1:30 PM Medical Record Number: [id number] Patient Account Number: [id number] Date of Birth/Sex: June 17, 1946 (71 y.o. Female) Treating RN: [name], [name] Primary Care Provider: Milon [name] Other Clinician: Referring Provider: Referral, Self Treating Provider/Extender: [name] in Treatment: 6 Subjective Chief Complaint Information obtained from Patient patient is here for follow-up evaluation of a right lower extremity ulcer History of Present Illness (HPI) The following HPI elements were documented for the patient's wound: Location: right lower extremity in the area of the lateral calf below the knee Quality: She is having no pain Context: The wound occurred when the patient a blunt injury and fall injury Modifying Factors: None as patient was healed Associated Signs and Symptoms: Patient reports having  increase discharge. Readmission: 02/20/17 on evaluation today patient's wound which had previously healed in June 2018 unfortunately had two areas that began to drain in the past week. She therefore called to be reevaluated in regard to this wound. At the location of the healed wound server she has two panel locations one at 9 o'clock and one at 6 o'clock it appeared to be draining fluid and it seems that she did not fully heal underneath the wound as this close. This therefore trapped fluid and now has reopened. Fortunately it seems to be mild and I think this will likely close and heal very quickly although obviously this is frustrating for the patient and she thought that she was done with this wound. No fevers, chills, nausea, or vomiting noted at this time. Patient is having no pain. 02/27/17 on evaluation today patient's right lower extremity ulcer appears  to be doing about the same. We have not made a lot of improvements. Fortunately we did get the results of her culture back which showed no growth this was good news. Again I was not overly convinced that this would be infected but nonetheless we wanted to be sure just considering how it was progressing. Fortunately she has no significant pain other than with dressing changes and then it's around a two out of 10. No fevers, chills, nausea, or vomiting noted at this time. 03/07/2017 -- this patient was discharged on 12/27/2016 has not been wearing her compression stockings as advised but has been wearing TED hose which are basically useless for a problem. [name] continues to smoke about half pack of cigarettes a day ======== Old Notes 71 year old patient has been referred to as with right lower extremity wound with edema and ecchymosis, after having a injury [name] to a fall on 08/20/2016. she has a history of diabetes mellitus type 2 which is [name], [name] ([id number]) uncomplicated, chronic back pain, COPD, hypertension, hypothyroidism,  supraventricular tachycardia and tobacco abuse. [name] is status post neck surgery, appendectomy, cholecystectomy, hernia repair. [name] smokes about half a pack of cigarettes a day for the last 50 years. Recently on doxycycline for 10 days and was given symptomatic treatment for wound. 10/04/2016 -- the patient tolerated the snap vacuum system well and had no significant problems. She continues to smoke and is trying to give it up. 10/11/2016 -- she continues to tolerate the snap like him system fairly well and other than that she has no fresh issues 11/01/16- she is here for follow-up evaluation of her right lower extremity ulcer. She is tolerating the SNAP negative pressure system. She is voicing no complaints of pain. She states her glucose levels are consistently less than 150 with her evening glucose levels being higher than morning glucose levels. She continues to smoke, approximately 0.5-ppd which is a reduction of 1-ppd approximately 6 months ago. 11/08/16 patient on evaluation today fortunately does not appear to be having as much discomfort as she has in the past. She is tolerating the dressing changes well although she does note some irritation where the wound VAC has been applied. She specifically wonders if we could see about taking a break possibly progressing to discontinuation of the wound VAC. There is no evidence of infection. 12/20/2016 -- we had prepared to use a Grafix skin substitute today but her wound had improved remarkably and we did not use this ===== Objective Constitutional Pulse regular. Respirations normal and unlabored. Afebrile. Vitals Time Taken: 1:21 PM, Height: 67 in, Weight: 202 lbs, BMI: 31.6, Temperature: 97.7 F, Pulse: 81 bpm, Respiratory Rate: 18 breaths/min, Blood Pressure: 147/69 mmHg. Eyes Nonicteric. Reactive to light. Ears, Nose, Mouth, and Throat Lips, teeth, and gums WNL.Marland Kitchen Moist mucosa without lesions. Neck supple and nontender. No palpable  supraclavicular or cervical adenopathy. Normal sized without goiter. [name], [name] ([id number]) Respiratory WNL. No retractions.. Cardiovascular Pedal Pulses WNL. No clubbing, cyanosis or edema. Genitourinary (GU) No hydrocele, spermatocele, tenderness of the cord, or testicular mass.Marland Kitchen Penis without lesions.Renetta Chalk without lesions. No cystocele, or rectocele. Pelvic support intact, no discharge.Marland Kitchen Urethra without masses, tenderness or scarring.Marland Kitchen Lymphatic No adneopathy. No adenopathy. No adenopathy. Musculoskeletal Adexa without tenderness or enlargement.. Digits and nails w/o clubbing, cyanosis, infection, petechiae, ischemia, or inflammatory conditions.Marland Kitchen Psychiatric Judgement and insight Intact.. No evidence of depression, anxiety, or agitation.. General Notes: both the sinus tracts at the 6 and 9:00 positions seem to have  healed and there was no drainage of fluid. Integumentary (Hair, Skin) No suspicious lesions. No crepitus or fluctuance. No peri-wound warmth or erythema. No masses.. Wound #2 status is Open. Original cause of wound was Gradually Appeared. The wound is located on the Right,Distal,Anterior Lower Leg. The wound measures 0.1cm length x 0.1cm width x 0.1cm depth; 0.008cm^2 area and 0.001cm^3 volume. There is no tunneling or undermining noted. There is a small amount of purulent drainage noted. The wound margin is distinct with the outline attached to the wound base. There is no granulation within the wound bed. There is no necrotic tissue within the wound bed. The periwound skin appearance did not exhibit: Callus, Crepitus, Excoriation, Induration, Rash, Scarring, Dry/Scaly, Maceration, Atrophie Blanche, Cyanosis, Ecchymosis, Hemosiderin Staining, Mottled, Pallor, Rubor, Erythema. Periwound temperature was noted as No Abnormality. The periwound has tenderness on palpation. Wound #3 status is Open. Original cause of wound was Gradually Appeared. The wound is located  on the Right,Proximal,Anterior Lower Leg. The wound measures 0.1cm length x 0.1cm width x 0.1cm depth; 0.008cm^2 area and 0.001cm^3 volume. There is no tunneling or undermining noted. There is a small amount of sanguinous drainage noted. The wound margin is flat and intact. There is no granulation within the wound bed. There is no necrotic tissue within the wound bed. The periwound skin appearance did not exhibit: Callus, Crepitus, Excoriation, Induration, Rash, Scarring, Dry/Scaly, Maceration, Atrophie Blanche, Cyanosis, Ecchymosis, Hemosiderin Staining, Mottled, Pallor, Rubor, Erythema. [name], [name] ([id number]) Assessment Active Problems ICD-10 E11.622 - Type 2 diabetes mellitus with other skin ulcer L97.212 - Non-pressure chronic ulcer of right calf with fat layer exposed F17.218 - Nicotine dependence, cigarettes, with other nicotine-induced disorders I89.0 - Lymphedema, not elsewhere classified Plan Wound Cleansing: Wound #2 Right,Distal,Anterior Lower Leg: Clean wound with Normal Saline. Cleanse wound with mild soap and water Wound #3 Right,Proximal,Anterior Lower Leg: Clean wound with Normal Saline. Cleanse wound with mild soap and water Anesthetic: Wound #2 Right,Distal,Anterior Lower Leg: Topical Lidocaine 4% cream applied to wound bed prior to debridement Wound #3 Right,Proximal,Anterior Lower Leg: Topical Lidocaine 4% cream applied to wound bed prior to debridement Skin Barriers/Peri-Wound Care: Wound #2 Right,Distal,Anterior Lower Leg: Skin Prep Wound #3 Right,Proximal,Anterior Lower Leg: Skin Prep Primary Wound Dressing: Wound #2 Right,Distal,Anterior Lower Leg: Foam Wound #3 Right,Proximal,Anterior Lower Leg: Foam Secondary Dressing: Wound #2 Right,Distal,Anterior Lower Leg: Dry Gauze - bolster Conform/Kerlix Wound #3 Right,Proximal,Anterior Lower Leg: Dry Gauze - bolster Conform/Kerlix Dressing Change Frequency: Wound #2 Right,Distal,Anterior Lower  Leg: Change dressing every other day. Wound #3 Right,Proximal,Anterior Lower Leg: [name], [name] ([id number]) Change dressing every other day. Follow-up Appointments: Wound #2 Right,Distal,Anterior Lower Leg: Return Appointment in 1 week. Wound #3 Right,Proximal,Anterior Lower Leg: Return Appointment in 1 week. Edema Control: Wound #2 Right,Distal,Anterior Lower Leg: Support Garment 10-20 mm/Hg pressure to: Wound #3 Right,Proximal,Anterior Lower Leg: Support Garment 10-20 mm/Hg pressure to: Additional Orders / Instructions: Wound #2 Right,Distal,Anterior Lower Leg: Increase protein intake. Wound #3 Right,Proximal,Anterior Lower Leg: Increase protein intake. she has made a good recovery over the last week and I have recommended: 1. Elevation and exercise 2. Compression stockings of the 20-30 mm variety to be worn every day according to a strict schedule 3. Foam and a Kerlix and Coban dressing over that area to have firm compression and obliterate the dead space in this region 4. I anticipate discharge soon. Electronic Signature(s) Signed: 04/03/2017 2:14:22 PM By: Janet Kanner MD, FACS Entered By: [name] on 04/03/2017 13:35:20 [name] ([id number]) -------------------------------------------------------------------------------- SuperBill  Details [name], [name] Date of Service: 04/03/2017 Medical Record Number: [id number] Patient Account Number: [id number] Date of Birth/Sex: [birth year] (71 y.o. Female) Treating RN: Phillis Haggis Primary Care Provider: Milon [name] Other Clinician: Referring Provider: Referral, Self Treating Provider/Extender: [name] in Treatment: 6 Diagnosis Coding ICD-10 Codes Code Description E11.622 Type 2 diabetes mellitus with other skin ulcer L97.212 Non-pressure chronic ulcer of right calf with fat layer exposed F17.218 Nicotine dependence, cigarettes, with other nicotine-induced disorders I89.0  Lymphedema, not elsewhere classified Facility Procedures CPT4 Code: 30865784 Description: 99213 - WOUND CARE VISIT-LEV 3 EST PT Modifier: Quantity: 1 Physician Procedures CPT4 Code Description: [id number] 99213 - WC PHYS LEVEL 3 - EST PT ICD-10 Description Diagnosis E11.622 Type 2 diabetes mellitus with other skin ulcer L97.212 Non-pressure chronic ulcer of right calf with fat l F17.218 Nicotine dependence, cigarettes, with  other nicotin I89.0 Lymphedema, not elsewhere classified Modifier: ayer exposed e-induced di Quantity: 1 sorders Electronic Signature(s) Signed: 04/03/2017 2:14:22 PM By: Janet Kanner MD, FACS Signed: 04/04/2017 5:11:04 PM By: Alejandro Mulling Entered By: Alejandro Mulling on 04/03/2017 13:45:34

## 2017-04-07 NOTE — Progress Notes (Addendum)
[name], [name] ([id number]) Visit Report for 04/03/2017 Arrival Information Details Patient Name: HOLY, BATTENFIELD Date of Service: 04/03/2017 1:30 PM Medical Record Number: [id number] Patient Account Number: [id number] Date of Birth/Sex: January 05, 1946 (71 y.o. Female) Treating RN: Ashok Cordia, Debi Primary Care Chianne Byrns: Milon Score Other Clinician: Referring Jennilee Demarco: Referral, Self Treating Fraida Veldman/Extender: Rudene Re in Treatment: 6 Visit Information History Since Last Visit All ordered tests and consults were completed: No Patient Arrived: Ambulatory Added or deleted any medications: No Arrival Time: 13:20 Any new allergies or adverse reactions: No Accompanied By: self Had a fall or experienced change in No Transfer Assistance: None activities of daily living that may affect Patient Identification Verified: Yes risk of falls: Secondary Verification Process Completed: Yes Signs or symptoms of abuse/neglect since last visito No Patient Requires Transmission-Based No Hospitalized since last visit: No Precautions: Has Dressing in Place as Prescribed: Yes Patient Has Alerts: Yes Pain Present Now: No Patient Alerts: DM II Electronic Signature(s) Signed: 04/04/2017 5:11:04 PM By: Alejandro Mulling Entered By: Alejandro Mulling on 04/03/2017 13:21:25 [name] ([id number]) -------------------------------------------------------------------------------- Clinic Level of Care Assessment Details [name] Date of Service: 04/03/2017 1:30 PM Medical Record Number: [id number] Patient Account Number: [id number] Date of Birth/Sex: 01/24/1946 (71 y.o. Female) Treating RN: Ashok Cordia, Debi Primary Care Leasia Swann: Milon Score Other Clinician: Referring Kayzen Kendzierski: Referral, Self Treating Jaonna Word/Extender: Rudene Re in Treatment: 6 Clinic Level of Care Assessment Items TOOL 4 Quantity Score X - Use when only an EandM is performed on FOLLOW-UP visit  1 0 ASSESSMENTS - Nursing Assessment / Reassessment X - Reassessment of Co-morbidities (includes updates in patient status) 1 10 X- 1 5 Reassessment of Adherence to Treatment Plan ASSESSMENTS - Wound and Skin Assessment / Reassessment []  - Simple Wound Assessment / Reassessment - one wound 0 X- 2 5 Complex Wound Assessment / Reassessment - multiple wounds []  - 0 Dermatologic / Skin Assessment (not related to wound area) ASSESSMENTS - Focused Assessment []  - Circumferential Edema Measurements - multi extremities 0 []  - 0 Nutritional Assessment / Counseling / Intervention []  - 0 Lower Extremity Assessment (monofilament, tuning fork, pulses) []  - 0 Peripheral Arterial Disease Assessment (using hand held doppler) ASSESSMENTS - Ostomy and/or Continence Assessment and Care []  - Incontinence Assessment and Management 0 []  - 0 Ostomy Care Assessment and Management (repouching, etc.) PROCESS - Coordination of Care X - Simple Patient / Family Education for ongoing care 1 15 []  - 0 Complex (extensive) Patient / Family Education for ongoing care []  - 0 Staff obtains Chiropractor, Records, Test Results / Process Orders []  - 0 Staff telephones HHA, Nursing Homes / Clarify orders / etc []  - 0 Routine Transfer to another Facility (non-emergent condition) []  - 0 Routine Hospital Admission (non-emergent condition) []  - 0 New Admissions / Manufacturing engineer / Ordering NPWT, Apligraf, etc. []  - 0 Emergency Hospital Admission (emergent condition) X- 1 10 Simple Discharge Coordination Delancey, Myles C. ([id number]) []  - 0 Complex (extensive) Discharge Coordination PROCESS - Special Needs []  - Pediatric / Minor Patient Management 0 []  - 0 Isolation Patient Management []  - 0 Hearing / Language / Visual special needs []  - 0 Assessment of Community assistance (transportation, D/C planning, etc.) []  - 0 Additional assistance / Altered mentation []  - 0 Support Surface(s) Assessment (bed,  cushion, seat, etc.) INTERVENTIONS - Wound Cleansing / Measurement []  - Simple Wound Cleansing - one wound 0 X- 2 5 Complex Wound Cleansing - multiple wounds X- 1 5 Wound Imaging (photographs -  any number of wounds)  - 0 Wound Tracing (instead of photographs)  - 0 Simple Wound Measurement - one wound X- 2 5 Complex Wound Measurement - multiple wounds INTERVENTIONS - Wound Dressings X - Small Wound Dressing one or multiple wounds 1 10  - 0 Medium Wound Dressing one or multiple wounds  - 0 Large Wound Dressing one or multiple wounds  - 0 Application of Medications - topical  - 0 Application of Medications - injection INTERVENTIONS - Miscellaneous  - External ear exam 0  - 0 Specimen Collection (cultures, biopsies, blood, body fluids, etc.)  - 0 Specimen(s) / Culture(s) sent or taken to Lab for analysis  - 0 Patient Transfer (multiple staff / Nurse, adult / Similar devices)  - 0 Simple Staple / Suture removal (25 or less)  - 0 Complex Staple / Suture removal (26 or more)  - 0 Hypo / Hyperglycemic Management (close monitor of Blood Glucose)  - 0 Ankle / Brachial Index (ABI) - do not check if billed separately X- 1 5 Vital Signs Hubers, Nirvana C. ([id number]) Has the patient been seen at the hospital within the last three years: Yes Total Score: 90 Level Of Care: New/Established - Level 3 Electronic Signature(s) Signed: 04/04/2017 5:11:04 PM By: Alejandro Mulling Entered By: Alejandro Mulling on 04/03/2017 13:44:54 [name] ([id number]) -------------------------------------------------------------------------------- Encounter Discharge Information Details [name] Date of Service: 04/03/2017 1:30 PM Medical Record Number: [id number] Patient Account Number: [id number] Date of Birth/Sex: 1945/10/18 (71 y.o. Female) Treating RN: Ashok Cordia, Debi Primary Care Patrese Neal: Milon Score Other Clinician: Referring Creg Gilmer:  Referral, Self Treating Beauty Pless/Extender: Rudene Re in Treatment: 6 Encounter Discharge Information Items Discharge Pain Level: 0 Discharge Condition: Stable Ambulatory Status: Ambulatory Discharge Destination: Home Transportation: Private Auto Accompanied By: self Schedule Follow-up Appointment: Yes Medication Reconciliation completed and No provided to Patient/Care Caylyn Tedeschi: Patient Clinical Summary of Care: Declined Electronic Signature(s) Signed: 04/07/2017 9:26:52 AM By: Gwenlyn Perking Entered By: Gwenlyn Perking on 04/03/2017 13:39:36 [name] ([id number]) -------------------------------------------------------------------------------- Lower Extremity Assessment Details [name] Date of Service: 04/03/2017 1:30 PM Medical Record Number: [id number] Patient Account Number: [id number] Date of Birth/Sex: 04-Nov-1945 (71 y.o. Female) Treating RN: Ashok Cordia, Debi Primary Care Kaytlin Burklow: Milon Score Other Clinician: Referring Colburn Asper: Referral, Self Treating Araseli Sherry/Extender: Rudene Re in Treatment: 6 Vascular Assessment Pulses: Dorsalis Pedis Palpable: [Right:Yes] Posterior Tibial Extremity colors, hair growth, and conditions: Extremity Color: [Right:Hyperpigmented] Temperature of Extremity: [Right:Warm] Capillary Refill: [Right:< 3 seconds] Electronic Signature(s) Signed: 04/04/2017 5:11:04 PM By: Alejandro Mulling Entered By: Alejandro Mulling on 04/03/2017 13:44:07 [name] ([id number]) -------------------------------------------------------------------------------- Multi Wound Chart Details [name] Date of Service: 04/03/2017 1:30 PM Medical Record Number: [id number] Patient Account Number: [id number] Date of Birth/Sex: 12/26/1945 (71 y.o. 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] [3:Lower Extremity] Comorbid History: [2:Chronic Obstructive Pulmonary Disease (COPD), Pulmonary Disease (COPD), Hypertension, Type II Diabetes Hypertension, Type II Diabetes] [3:Chronic Obstructive] [N/A:N/A] Date Acquired: [2:02/14/2017] [3:02/14/2017] [N/A:N/A] Weeks of Treatment: [2:6] [3:6] [N/A:N/A] Wound Status: [  2:Open] [3:Open] [N/A:N/A] Measurements L x W x D [2:0.1x0.1x0.1] [3:0.1x0.1x0.1] [N/A:N/A] (cm) Area (cm) : [2:0.008] [3:0.008] [N/A:N/A] Volume (cm) : [2:0.001] [3:0.001] [N/A:N/A] % Reduction in Area: [2:50.00%] [3:50.00%] [N/A:N/A] % Reduction in Volume: [2:66.70%] [3:50.00%] [N/A:N/A] Classification: [2:Grade 1] [3:Grade 1] [N/A:N/A] Exudate Amount: [2:Small] [3:Small] [N/A:N/A] Exudate Type: [2:Purulent] [3:Sanguinous] [N/A:N/A] Exudate Color: [2:yellow, brown, green] [3:red] [N/A:N/A] Wound Margin: [2:Distinct, outline attached] [3:Flat and Intact] [N/A:N/A] Granulation Amount: [2:None Present (0%)] [3:None Present (0%)] [N/A:N/A] Necrotic Amount: [2:None Present (0%)] [3:None Present (0%)] [N/A:N/A] Epithelialization: [2:None] [3:None] [N/A:N/A] Periwound Skin Texture: [2:Excoriation: No Induration: No Callus: No Crepitus: No Rash: No Scarring: No] [3:Excoriation: No Induration: No Callus: No Crepitus: No Rash: No Scarring: No] [N/A:N/A] Periwound Skin Moisture: [2:Maceration: No Dry/Scaly: No] [3:Maceration: No Dry/Scaly: No]  [N/A:N/A] Periwound Skin Color: [2:Atrophie Blanche: No Cyanosis: No Ecchymosis: No] [3:Atrophie Blanche: No Cyanosis: No Ecchymosis: No] [N/A:N/A] Erythema: No Erythema: No Hemosiderin Staining: No Hemosiderin Staining: No Mottled: No Mottled: No Pallor: No Pallor: No Rubor: No Rubor: No Temperature: No Abnormality N/A N/A Tenderness on Palpation: Yes No N/A Wound Preparation: Ulcer Cleansing: Ulcer Cleansing: N/A Rinsed/Irrigated with Saline Rinsed/Irrigated with Saline Topical Anesthetic Applied: Topical Anesthetic Applied: None None Treatment Notes Wound #2 (Right, Distal, Anterior Lower Leg) 1. Cleansed with: Clean wound with Normal Saline 4. Dressing Applied: Foam 5. Secondary Dressing Applied Kerlix/Conform 7. Secured with Tape Notes netting Wound #3 (Right, Proximal, Anterior Lower Leg) 1. Cleansed with: Clean wound with Normal Saline 4. Dressing Applied: Foam 5. Secondary Dressing Applied Kerlix/Conform 7. Secured with Tape Notes netting Electronic Signature(s) Signed: 04/03/2017 2:14:22 PM By: Evlyn Kanner MD, FACS Entered By: Evlyn Kanner on 04/03/2017 13:33:48 ALEIAH, MOHAMMED ([id number]) -------------------------------------------------------------------------------- Multi-Disciplinary Care Plan Details [name] Date of Service: 04/03/2017 1:30 PM Medical Record Number: [id number] Patient Account Number: [id number] Date of Birth/Sex: 03/28/1946 (71 y.o. Female) Treating RN: Ashok Cordia, Debi Primary Care Authur Cubit: Milon Score Other Clinician: Referring Dyon Rotert: Referral, Self Treating Tanasia Budzinski/Extender: Rudene Re in Treatment: 6 Active Inactive Electronic Signature(s) Signed: 06/11/2017 10:03:52 AM By: Alejandro Mulling Previous Signature: 04/11/2017 1:58:09 PM Version By: Elliot Gurney BSN, RN, CWS, Kim RN, BSN Previous Signature: 04/04/2017 5:11:04 PM Version By: Alejandro Mulling Entered By: Alejandro Mulling on  06/11/2017 10:03:51 [name] ([id number]) -------------------------------------------------------------------------------- Pain Assessment Details [name] Date of Service: 04/03/2017 1:30 PM Medical Record Number: [id number] Patient Account Number: [id number] Date of Birth/Sex: 22-Jul-1945 (71 y.o. Female) Treating RN: Ashok Cordia, Debi Primary Care Danity Schmelzer: Milon Score Other Clinician: Referring Asmaa Tirpak: Referral, Self Treating Lavone Weisel/Extender: Rudene Re in Treatment: 6 Active Problems Location of Pain Severity and Description of Pain Patient Has Paino No Site Locations Pain Management and Medication Current Pain Management: Electronic Signature(s) Signed: 04/04/2017 5:11:04 PM By: Alejandro Mulling Entered By: Alejandro Mulling on 04/03/2017 13:21:31 [name] ([id number]) -------------------------------------------------------------------------------- Patient/Caregiver Education Details [name] Date of Service: 04/03/2017 1:30 PM Medical Record Number: [id number] Patient Account Number: [id number] Date of Birth/Gender: [birth year] (71 y.o. Female) Treating RN: Ashok Cordia, Debi Primary Care Physician: Milon Score Other Clinician: Referring Physician: Referral, Self Treating Physician/Extender: Rudene Re in Treatment: 6 Education Assessment Education Provided To: Patient Education Topics Provided Wound/Skin Impairment: Handouts: Other: change dressing as ordered Methods: Demonstration, Explain/Verbal Responses: State content correctly Electronic Signature(s) Signed: 04/04/2017 5:11:04 PM By: Alejandro Mulling Entered By: Alejandro Mulling on 04/03/2017 13:32:06 [name] ([id number]) -------------------------------------------------------------------------------- Wound Assessment Details [name] Date of Service: 04/03/2017 1:30 PM Medical Record  Number:  [id number] Patient Account Number: [id number] Date of Birth/Sex: 1945-09-20 (71 y.o. Female) Treating RN: Ashok Cordia, Debi Primary Care Cleto Claggett: Milon Score Other Clinician: Referring Kialee Kham: Referral, Self Treating Meili Kleckley/Extender: Rudene Re in Treatment: 6 Wound Status Wound Number: 2 Primary Diabetic Wound/Ulcer of the Lower Extremity Etiology: Wound Location: Right Lower Leg - Anterior, Distal Wound Open Wounding Event: Gradually Appeared Status: Date Acquired: 02/14/2017 Comorbid Chronic Obstructive Pulmonary Disease Weeks Of Treatment: 6 History: (COPD), Hypertension, Type II Diabetes Clustered Wound: No Photos Photo Uploaded By: Alejandro Mulling on 04/03/2017 16:27:00 Wound Measurements Length: (cm) 0.1 Width: (cm) 0.1 Depth: (cm) 0.1 Area: (cm) 0.008 Volume: (cm) 0.001 % Reduction in Area: 50% % Reduction in Volume: 66.7% Epithelialization: None Tunneling: No Undermining: No Wound Description Classification: Grade 1 Wound Margin: Distinct, outline attached Exudate Amount: Small Exudate Type: Purulent Exudate Color: yellow, brown, green Foul Odor After Cleansing: No Slough/Fibrino No Wound Bed Granulation Amount: None Present (0%) Necrotic Amount: None Present (0%) Periwound Skin Texture Texture Color No Abnormalities Noted: No No Abnormalities Noted: No Callus: No Atrophie Blanche: No Crepitus: No Cyanosis: No Excoriation: No Ecchymosis: No Franzoni, Quiera C. ([id number]) Induration: No Erythema: No Rash: No Hemosiderin Staining: No Scarring: No Mottled: No Pallor: No Moisture Rubor: No No Abnormalities Noted: No Dry / Scaly: No Temperature / Pain Maceration: No Temperature: No Abnormality Tenderness on Palpation: Yes Wound Preparation Ulcer Cleansing: Rinsed/Irrigated with Saline Topical Anesthetic Applied: None Electronic Signature(s) Signed: 04/04/2017 5:11:04 PM By: Alejandro Mulling Entered By: Alejandro Mulling on  04/03/2017 13:27:59 [name] ([id number]) -------------------------------------------------------------------------------- Wound Assessment Details [name] Date of Service: 04/03/2017 1:30 PM Medical Record Number: [id number] Patient Account Number: [id number] Date of Birth/Sex: [birth year] (70 y.o. Female) Treating RN: Ashok Cordia, Debi Primary Care Jyles Sontag: Milon Score Other Clinician: Referring Myles Mallicoat: Referral, Self Treating Parth Mccormac/Extender: Rudene Re in Treatment: 6 Wound Status Wound Number: 3 Primary Diabetic Wound/Ulcer of the Lower Extremity Etiology: Wound Location: Right Lower Leg - Anterior, Proximal Wound Open Wounding Event: Gradually Appeared Status: Date Acquired: 02/14/2017 Comorbid Chronic Obstructive Pulmonary Disease Weeks Of Treatment: 6 History: (COPD), Hypertension, Type II Diabetes Clustered Wound: No Photos Photo Uploaded By: Alejandro Mulling on 04/03/2017 16:27:01 Wound Measurements Length: (cm) 0.1 Width: (cm) 0.1 Depth: (cm) 0.1 Area: (cm) 0.008 Volume: (cm) 0.001 % Reduction in Area: 50% % Reduction in Volume: 50% Epithelialization: None Tunneling: No Undermining: No Wound Description Classification: Grade 1 Wound Margin: Flat and Intact Exudate Amount: Small Exudate Type: Sanguinous Exudate Color: red Foul Odor After Cleansing: No Slough/Fibrino No Wound Bed Granulation Amount: None Present (0%) Exposed Structure Necrotic Amount: None Present (0%) Fascia Exposed: No Fat Layer (Subcutaneous Tissue) Exposed: No Tendon Exposed: No Muscle Exposed: No Joint Exposed: No Bone Exposed: No Periwound Skin Texture Glad, Jeri C. ([id number]) Texture Color No Abnormalities Noted: No No Abnormalities Noted: No Callus: No Atrophie Blanche: No Crepitus: No Cyanosis: No Excoriation: No Ecchymosis: No Induration: No Erythema: No Rash: No Hemosiderin Staining: No Scarring: No Mottled:  No Pallor: No Moisture Rubor: No No Abnormalities Noted: No Dry / Scaly: No Maceration: No Wound Preparation Ulcer Cleansing: Rinsed/Irrigated with Saline Topical Anesthetic Applied: None Electronic Signature(s) Signed: 04/04/2017 5:11:04 PM By: Alejandro Mulling Entered By: Alejandro Mulling on 04/03/2017 13:27:46 [name] ([id number]) -------------------------------------------------------------------------------- Vitals Details [name] Date of Service: 04/03/2017 1:30 PM Medical Record Number: [id number] Patient Account Number: [id number] Date of Birth/Sex: [birth year] (71 y.o. Female) Treating RN: Phillis Haggis Primary Care Simren Popson: Yetta Barre,  CARON Other Clinician: Referring Danisha Brassfield: Referral, Self Treating Zyshawn Bohnenkamp/Extender: Rudene Re in Treatment: 6 Vital Signs Time Taken: 13:21 Temperature (F): 97.7 Height (in): 67 Pulse (bpm): 81 Weight (lbs): 202 Respiratory Rate (breaths/min): 18 Body Mass Index (BMI): 31.6 Blood Pressure (mmHg): 147/69 Reference Range: 80 - 120 mg / dl Electronic Signature(s) Signed: 04/04/2017 5:11:04 PM By: Alejandro Mulling Entered By: Alejandro Mulling on 04/03/2017 13:23:18

## 2017-04-10 ENCOUNTER — Encounter: Payer: Medicare Other | Admitting: Surgery

## 2017-04-10 DIAGNOSIS — E11622 Type 2 diabetes mellitus with other skin ulcer: Secondary | ICD-10-CM | POA: Diagnosis not present

## 2017-04-12 NOTE — Progress Notes (Signed)
ISEL, SKUFCA ([id number]) Visit Report for 04/10/2017 Chief Complaint Document Details [name], [name] Date of Service: 04/10/2017 3:00 PM Medical Record Number: [id number] Patient Account Number: [id number] Date of Birth/Sex: November 06, 1945 (71 y.o. Female) Treating RN: Huel Coventry Primary Care Provider: Milon Score Other Clinician: Referring Provider: Referral, Self Treating Provider/Extender: Rudene Re in Treatment: 7 Information Obtained from: Patient Chief Complaint patient is here for follow-up evaluation of a right lower extremity ulcer Electronic Signature(s) Signed: 04/10/2017 4:10:10 PM By: Evlyn Kanner MD, FACS Entered By: Evlyn Kanner on 04/10/2017 15:07:25 [name] ([id number]) -------------------------------------------------------------------------------- HPI Details [name] Date of Service: 04/10/2017 3:00 PM Medical Record Number: [id number] Patient Account Number: [id number] Date of Birth/Sex: 12/21/1945 (71 y.o. Female) Treating RN: Huel Coventry Primary Care Provider: Milon Score Other Clinician: Referring Provider: Referral, Self Treating Provider/Extender: Rudene Re in Treatment: 7 History of Present Illness Location: right lower extremity in the area of the lateral calf below the knee Quality: She is having no pain Context: The wound occurred when the patient a blunt injury and fall injury Modifying Factors: None as patient was healed Associated Signs and Symptoms: Patient reports having increase discharge. HPI Description: Readmission: 02/20/17 on evaluation today patient's wound which had previously healed in June 2018 unfortunately had two areas that began to drain in the past week. She therefore called to be reevaluated in regard to this wound. At the location of the healed wound server she has two panel locations one at 9 o'clock and one at 6 o'clock it appeared to be draining fluid and it  seems that she did not fully heal underneath the wound as this close. This therefore trapped fluid and now has reopened. Fortunately it seems to be mild and I think this will likely close and heal very quickly although obviously this is frustrating for the patient and she thought that she was done with this wound. No fevers, chills, nausea, or vomiting noted at this time. Patient is having no pain. 02/27/17 on evaluation today patient's right lower extremity ulcer appears to be doing about the same. We have not made a lot of improvements. Fortunately we did get the results of her culture back which showed no growth this was good news. Again I was not overly convinced that this would be infected but nonetheless we wanted to be sure just considering how it was progressing. Fortunately she has no significant pain other than with dressing changes and then it's around a two out of 10. No fevers, chills, nausea, or vomiting noted at this time. 03/07/2017 -- this patient was discharged on 12/27/2016 has not been wearing her compression stockings as advised but has been wearing TED hose which are basically useless for a problem. He continues to smoke about half pack of cigarettes a day ======== Old Notes 71 year old patient has been referred to as with right lower extremity wound with edema and ecchymosis, after having a injury due to a fall on 08/20/2016. she has a history of diabetes mellitus type 2 which is uncomplicated, chronic back pain, COPD, hypertension, hypothyroidism, supraventricular tachycardia and tobacco abuse. He is status post neck surgery, appendectomy, cholecystectomy, hernia repair. He smokes about half a pack of cigarettes a day for the last 50 years. Recently on doxycycline for 10 days and was given symptomatic treatment for wound. 10/04/2016 -- the patient tolerated the snap vacuum system well and had no significant problems. She continues to smoke and is trying to give  it  up. 10/11/2016 -- she continues to tolerate the snap like him system fairly well and other than that she has no Renbarger, Eilidh C. ([id number]) fresh issues 11/01/16- she is here for follow-up evaluation of her right lower extremity ulcer. She is tolerating the SNAP negative pressure system. She is voicing no complaints of pain. She states her glucose levels are consistently less than 150 with her evening glucose levels being higher than morning glucose levels. She continues to smoke, approximately 0.5-ppd which is a reduction of 1-ppd approximately 6 months ago. 11/08/16 patient on evaluation today fortunately does not appear to be having as much discomfort as she has in the past. She is tolerating the dressing changes well although she does note some irritation where the wound VAC has been applied. She specifically wonders if we could see about taking a break possibly progressing to discontinuation of the wound VAC. There is no evidence of infection. 12/20/2016 -- we had prepared to use a Grafix skin substitute today but her wound had improved remarkably and we did not use this ===== Electronic Signature(s) Signed: 04/10/2017 4:10:10 PM By: Evlyn Kanner MD, FACS Entered By: Evlyn Kanner on 04/10/2017 15:07:30 [name] ([id number]) -------------------------------------------------------------------------------- Physical Exam Details [name] Date of Service: 04/10/2017 3:00 PM Medical Record Number: [id number] Patient Account Number: [id number] Date of Birth/Sex: April 01, 1946 (71 y.o. Female) Treating RN: Huel Coventry Primary Care Provider: Milon Score Other Clinician: Referring Provider: Referral, Self Treating Provider/Extender: Rudene Re in Treatment: 7 Notes the wound is healed Electronic Signature(s) Signed: 04/10/2017 4:10:10 PM By: Evlyn Kanner MD, FACS Entered By: Evlyn Kanner on 04/10/2017 15:07:41 [name], [name]  ([id number]) -------------------------------------------------------------------------------- Physician Orders Details [name] Date of Service: 04/10/2017 3:00 PM Medical Record Number: [id number] Patient Account Number: [id number] Date of Birth/Sex: 1946-04-16 (71 y.o. Female) Treating RN: Huel Coventry Primary Care Provider: Milon Score Other Clinician: Referring Provider: Referral, Self Treating Provider/Extender: Rudene Re in Treatment: 7 Verbal / Phone Orders: No Diagnosis Coding Discharge From [facility] Services o Discharge from Wound Care Center - Treatment Complete Electronic Signature(s) Signed: 04/10/2017 4:10:10 PM By: Evlyn Kanner MD, FACS Signed: 04/10/2017 4:44:24 PM By: Elliot Gurney BSN, RN, CWS, Kim RN, BSN Entered By: Elliot Gurney, BSN, RN, CWS, Kim on 04/10/2017 15:06:27 [name], [name] ([id number]) -------------------------------------------------------------------------------- Problem List Details [name] Date of Service: 04/10/2017 3:00 PM Medical Record Number: [id number] Patient Account Number: [id number] Date of Birth/Sex: 04/14/1946 (71 y.o. 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 F17.218 Nicotine dependence, cigarettes, with other nicotine- 02/20/2017 Yes induced disorders I89.0 Lymphedema, not elsewhere classified 03/07/2017 Yes Inactive Problems Resolved Problems Electronic Signature(s) Signed: 04/10/2017 4:10:10 PM By: Evlyn Kanner MD, FACS Entered By: Evlyn Kanner on 04/10/2017 15:07:14 [name]  ([id number]) -------------------------------------------------------------------------------- Progress Note Details [name] Date of Service: 04/10/2017 3:00 PM Medical Record Number: [id number] Patient Account Number: [id number] Date of Birth/Sex: 1945-11-15 (71 y.o. Female) Treating RN: Huel Coventry Primary Care Provider: Milon Score Other Clinician: Referring Provider: Referral, Self Treating Provider/Extender: Rudene Re in Treatment: 7 Subjective Chief Complaint Information obtained from Patient patient is here for follow-up evaluation of a right lower extremity  ulcer History of Present Illness (HPI) The following HPI elements were documented for the patient's wound: Location: right lower extremity in the area of the lateral calf below the knee Quality: She is having no pain Context: The wound occurred when the patient a blunt injury and fall injury Modifying Factors: None as patient was healed Associated Signs and Symptoms: Patient reports having increase discharge. Readmission: 02/20/17 on evaluation today patient's wound which had previously healed in June 2018 unfortunately had two areas that began to drain in the past week. She therefore called to be reevaluated in regard to this wound. At the location of the healed wound server she has two panel locations one at 9 o'clock and one at 6 o'clock it appeared to be draining fluid and it seems that she did not fully heal underneath the wound as this close. This therefore trapped fluid and now has reopened. Fortunately it seems to be mild and I think this will likely close and heal very quickly although obviously this is frustrating for the patient and she thought that she was done with this wound. No fevers, chills, nausea, or vomiting noted at this time. Patient is having no pain. 02/27/17 on evaluation today patient's right lower extremity ulcer appears to be doing about the same. We have not made a lot  of improvements. Fortunately we did get the results of her culture back which showed no growth this was good news. Again I was not overly convinced that this would be infected but nonetheless we wanted to be sure just considering how it was progressing. Fortunately she has no significant pain other than with dressing changes and then it's around a two out of 10. No fevers, chills, nausea, or vomiting noted at this time. 03/07/2017 -- this patient was discharged on 12/27/2016 has not been wearing her compression stockings as advised but has been wearing TED hose which are basically useless for a problem. He continues to smoke about half pack of cigarettes a day ======== Old Notes 71 year old patient has been referred to as with right lower extremity wound with edema and ecchymosis, after having a injury due to a fall on 08/20/2016. she has a history of diabetes mellitus type 2 which is [name], [name] ([id number]) uncomplicated, chronic back pain, COPD, hypertension, hypothyroidism, supraventricular tachycardia and tobacco abuse. He is status post neck surgery, appendectomy, cholecystectomy, hernia repair. He smokes about half a pack of cigarettes a day for the last 50 years. Recently on doxycycline for 10 days and was given symptomatic treatment for wound. 10/04/2016 -- the patient tolerated the snap vacuum system well and had no significant problems. She continues to smoke and is trying to give it up. 10/11/2016 -- she continues to tolerate the snap like him system fairly well and other than that she has no fresh issues 11/01/16- she is here for follow-up evaluation of her right lower extremity ulcer. She is tolerating the SNAP negative pressure system. She is voicing no complaints of pain. She states her glucose levels are consistently less than 150 with her evening glucose levels being higher than morning glucose levels. She continues to smoke, approximately 0.5-ppd which is a reduction of  1-ppd approximately 6 months ago. 11/08/16 patient on evaluation today fortunately does not appear to be having as much discomfort as she has in the past. She is tolerating the dressing changes well although she does note some irritation where the wound VAC has been applied. She specifically wonders if we could see about  taking a break possibly progressing to discontinuation of the wound VAC. There is no evidence of infection. 12/20/2016 -- we had prepared to use a Grafix skin substitute today but her wound had improved remarkably and we did not use this ===== Objective Constitutional Vitals Time Taken: 2:53 PM, Height: 67 in, Weight: 202 lbs, BMI: 31.6, Temperature: 98.0 F, Pulse: 77 bpm, Respiratory Rate: 16 breaths/min, Blood Pressure: 91/75 mmHg. Integumentary (Hair, Skin) Wound #2 status is Healed - Epithelialized. Original cause of wound was Gradually Appeared. The wound is located on the Right,Distal,Anterior Lower Leg. The wound measures 0cm length x 0cm width x 0cm depth; 0cm^2 area and 0cm^3 volume. There is no tunneling or undermining noted. There is a none present amount of drainage noted. The wound margin is indistinct and nonvisible. There is no granulation within the wound bed. There is no necrotic tissue within the wound bed. The periwound skin appearance did not exhibit: Callus, Crepitus, Excoriation, Induration, Rash, Scarring, Dry/Scaly, Maceration, Atrophie Blanche, Cyanosis, Ecchymosis, Hemosiderin Staining, Mottled, Pallor, Rubor, Erythema. Wound #3 status is Healed - Epithelialized. Original cause of wound was Gradually Appeared. The wound is located on the Right,Proximal,Anterior Lower Leg. The wound measures 0cm length x 0cm width x 0cm [name], [name] ([id number]) depth; 0cm^2 area and 0cm^3 volume. There is no tunneling or undermining noted. There is a none present amount of drainage noted. The wound margin is indistinct and nonvisible. There is no granulation  within the wound bed. There is no necrotic tissue within the wound bed. The periwound skin appearance did not exhibit: Callus, Crepitus, Excoriation, Induration, Rash, Scarring, Dry/Scaly, Maceration, Atrophie Blanche, Cyanosis, Ecchymosis, Hemosiderin Staining, Mottled, Pallor, Rubor, Erythema. 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 She is urged to continue to wear her compression stockings with a bordered foam over the recently healed scar. Electronic Signature(s) Signed: 04/10/2017 4:10:10 PM By: Evlyn Kanner MD, FACS Entered By: Evlyn Kanner on 04/10/2017 15:08:10 [name], [name] ([id number]) -------------------------------------------------------------------------------- SuperBill Details [name] Date of Service: 04/10/2017 Medical Record Number: [id number] Patient Account Number: [id number] Date of Birth/Sex: [birth year] (71 y.o. Female) Treating RN: Huel Coventry Primary Care Provider: Milon Score Other Clinician: Referring Provider: Referral, Self Treating Provider/Extender: Rudene Re in Treatment: 7 Diagnosis Coding ICD-10 Codes Code Description E11.622 Type 2 diabetes mellitus with other skin ulcer L97.212 Non-pressure chronic ulcer of right calf with fat layer exposed F17.218 Nicotine dependence, cigarettes, with other nicotine-induced disorders I89.0 Lymphedema, not elsewhere classified Facility Procedures CPT4 Code: 08657846 Description: [id number] - WOUND CARE VISIT-LEV 2 EST  PT Modifier: Quantity: 1 Physician Procedures CPT4 Code: [id number] Description: 40102 - WC PHYS LEVEL 2 - EST PT ICD-10 Description Diagnosis E11.622 Type 2 diabetes mellitus with other skin ulcer L97.212 Non-pressure chronic ulcer of right calf with fat I89.0 Lymphedema, not elsewhere classified Modifier: layer exposed Quantity: 1 Electronic Signature(s) Signed: 04/10/2017 4:10:10 PM By: Evlyn Kanner MD, FACS Entered By: Evlyn Kanner on 04/10/2017 15:08:34

## 2017-04-14 NOTE — Progress Notes (Signed)
[name] ([id number]) Visit Report for 04/10/2017 Arrival Information Details [name], [name] Date of Service: 04/10/2017 3:00 PM Medical Record Number: [id number] Patient Account Number: [id number] Date of Birth/Sex: January 24, 1946 (71 y.o. Female) Treating RN: Huel Coventry Primary Care Lynford Espinoza: Milon Score Other Clinician: Referring Kishana Battey: Referral, Self Treating Saira Kramme/Extender: Rudene Re in Treatment: 7 Visit Information History Since Last Visit Added or deleted any medications: No Patient Arrived: Ambulatory Any new allergies or adverse reactions: No Arrival Time: 14:52 Had a fall or experienced change in No Accompanied By: self activities of daily living that may affect Transfer Assistance: None risk of falls: Patient Identification Verified: Yes Signs or symptoms of abuse/neglect since last No Secondary Verification Process Yes visito Completed: Hospitalized since last visit: No Patient Requires Transmission-Based No Has Dressing in Place as Prescribed: Yes Precautions: Pain Present Now: No Patient Has Alerts: Yes Patient Alerts: DM II Electronic Signature(s) Signed: 04/10/2017 4:44:24 PM By: Elliot Gurney, BSN, RN, CWS, Kim RN, BSN Entered By: Elliot Gurney, BSN, RN, CWS, Kim on 04/10/2017 14:52:55 [name] ([id number]) -------------------------------------------------------------------------------- Clinic Level of Care Assessment Details [name] Date of Service: 04/10/2017 3:00 PM Medical Record Number: [id number] Patient Account Number: [id number] Date of Birth/Sex: 02-Jul-1946 (71 y.o. Female) Treating RN: Huel Coventry Primary Care Marwa Fuhrman: Milon Score Other Clinician: Referring Sherlyn Ebbert: Referral, Self Treating Azarius Lambson/Extender: Rudene Re in Treatment: 7 Clinic Level of Care Assessment Items TOOL 4 Quantity Score []  - Use when only an EandM is performed on FOLLOW-UP visit 0 ASSESSMENTS - Nursing  Assessment / Reassessment []  - Reassessment of Co-morbidities (includes updates in patient status) 0 X - Reassessment of Adherence to Treatment Plan 1 5 ASSESSMENTS - Wound and Skin Assessment / Reassessment X - Simple Wound Assessment / Reassessment - one wound 1 5 []  - Complex Wound Assessment / Reassessment - multiple wounds 0 []  - Dermatologic / Skin Assessment (not related to wound area) 0 ASSESSMENTS - Focused Assessment []  - Circumferential Edema Measurements - multi extremities 0 []  - Nutritional Assessment / Counseling / Intervention 0 []  - Lower Extremity Assessment (monofilament, tuning fork, pulses) 0 []  - Peripheral Arterial Disease Assessment (using hand held doppler) 0 ASSESSMENTS - Ostomy and/or Continence Assessment and Care []  - Incontinence Assessment and Management 0 []  - Ostomy Care Assessment and Management (repouching, etc.) 0 PROCESS - Coordination of Care X - Simple Patient / Family Education for ongoing care 1 15 []  - Complex (extensive) Patient / Family Education for ongoing care 0 []  - Staff obtains Chiropractor, Records, Test Results / Process Orders 0 []  - Staff telephones HHA, Nursing Homes / Clarify orders / etc 0 []  - Routine Transfer to another Facility (non-emergent condition) 0 Fitting, Kenasia C. ([id number]) []  - Routine Hospital Admission (non-emergent condition) 0 []  - New Admissions / Manufacturing engineer / Ordering NPWT, Apligraf, etc. 0 []  - Emergency Hospital Admission (emergent condition) 0 X - Simple Discharge Coordination 1 10 []  - Complex (extensive) Discharge Coordination 0 PROCESS - Special Needs []  - Pediatric / Minor Patient Management 0 []  - Isolation Patient Management 0 []  - Hearing / Language / Visual special needs 0 []  - Assessment of Community assistance (transportation, D/C planning, etc.) 0 []  - Additional assistance / Altered mentation 0 []  - Support Surface(s) Assessment (bed, cushion, seat, etc.) 0 INTERVENTIONS - Wound  Cleansing / Measurement X - Simple Wound Cleansing - one wound 1 5 []  - Complex Wound Cleansing - multiple wounds 0 X - Wound Imaging (  photographs - any number of wounds) 1 5  - Wound Tracing (instead of photographs) 0  - Simple Wound Measurement - one wound 0  - Complex Wound Measurement - multiple wounds 0 INTERVENTIONS - Wound Dressings  - Small Wound Dressing one or multiple wounds 0  - Medium Wound Dressing one or multiple wounds 0  - Large Wound Dressing one or multiple wounds 0  - Application of Medications - topical 0  - Application of Medications - injection 0 INTERVENTIONS - Miscellaneous  - External ear exam 0 Krakowski, Evangaline C. ([id number])  - Specimen Collection (cultures, biopsies, blood, body fluids, etc.) 0  - Specimen(s) / Culture(s) sent or taken to Lab for analysis 0  - Patient Transfer (multiple staff / Michiel Sites Lift / Similar devices) 0  - Simple Staple / Suture removal (25 or less) 0  - Complex Staple / Suture removal (26 or more) 0  - Hypo / Hyperglycemic Management (close monitor of Blood Glucose) 0  - Ankle / Brachial Index (ABI) - do not check if billed separately 0 X - Vital Signs 1 5 Has the patient been seen at the hospital within the last three years: Yes Total Score: 50 Level Of Care: New/Established - Level 2 Electronic Signature(s) Signed: 04/10/2017 4:44:24 PM By: Elliot Gurney, BSN, RN, CWS, Kim RN, BSN Entered By: Elliot Gurney, BSN, RN, CWS, Kim on 04/10/2017 15:06:52 KELLYANNE, ELLWANGER ([id number]) -------------------------------------------------------------------------------- Encounter Discharge Information Details [name] Date of Service: 04/10/2017 3:00 PM Medical Record Number: [id number] Patient Account Number: [id number] Date of Birth/Sex: May 04, 1946 (71 y.o. Female) Treating RN: Huel Coventry Primary Care Nadine Ryle: Milon Score Other Clinician: Referring Brandin Stetzer: Referral, Self Treating Aeon Kessner/Extender:  Rudene Re in Treatment: 7 Encounter Discharge Information Items Discharge Pain Level: 0 Discharge Condition: Stable Ambulatory Status: Ambulatory Discharge Destination: Home Transportation: Private Auto Accompanied By: self Schedule Follow-up Appointment: Yes Medication Reconciliation completed and provided to Patient/Care Yes Cherrill Scrima: Provided on Clinical Summary of Care: 04/10/2017 Form Type Recipient Paper Patient ML Electronic Signature(s) Signed: 04/14/2017 9:00:05 AM By: Gwenlyn Perking Entered By: Gwenlyn Perking on 04/10/2017 15:10:14 [name] ([id number]) -------------------------------------------------------------------------------- Lower Extremity Assessment Details [name] Date of Service: 04/10/2017 3:00 PM Medical Record Number: [id number] Patient Account Number: [id number] Date of Birth/Sex: 1946/06/11 (71 y.o. Female) Treating RN: Huel Coventry Primary Care Jaycee Mckellips: Milon Score Other Clinician: Referring Odyssey Vasbinder: Referral, Self Treating Stephenia Vogan/Extender: Rudene Re in Treatment: 7 Vascular Assessment Pulses: Dorsalis Pedis Palpable: [Right:Yes] Posterior Tibial Palpable: [Right:Yes] Extremity colors, hair growth, and conditions: Extremity Color: [Right:Pale] Hair Growth on Extremity: [Right:Yes] Temperature of Extremity: [Right:Warm] Capillary Refill: [Right:< 3 seconds] Dependent Rubor: [Right:No] Blanched when Elevated: [Right:No] Lipodermatosclerosis: [Right:No] Toe Nail Assessment Left: Right: Thick: No Discolored: No Deformed: No Improper Length and Hygiene: No Electronic Signature(s) Signed: 04/10/2017 4:44:24 PM By: Elliot Gurney, BSN, RN, CWS, Kim RN, BSN Entered By: Elliot Gurney, BSN, RN, CWS, Kim on 04/10/2017 15:00:27 [name] ([id number]) -------------------------------------------------------------------------------- Multi Wound Chart Details [name] Date of Service:  04/10/2017 3:00 PM Medical Record Number: [id number] Patient Account Number: [id number] Date of Birth/Sex: [birth year] (71 y.o. Female) Treating RN: Huel Coventry Primary Care Peaches Vanoverbeke: Milon Score Other Clinician: Referring Tydus Sanmiguel: Referral, Self Treating Rhylin Venters/Extender: Rudene Re in Treatment: 7 Vital Signs Height(in): 67 Pulse(bpm): 77 Weight(lbs): 202 Blood Pressure 91/75 (mmHg): Body Mass Index(BMI): 32 Temperature(F): 98.0 Respiratory Rate 16 (breaths/min): Photos: [N/A:N/A] Wound Location: Right Lower Leg - Right Lower Leg - N/A Anterior, Distal Anterior, Proximal  Wounding Event: Gradually Appeared Gradually Appeared N/A Primary Etiology: Diabetic Wound/Ulcer of Diabetic Wound/Ulcer of N/A the Lower Extremity the Lower Extremity Comorbid History: Chronic Obstructive Chronic Obstructive N/A Pulmonary Disease Pulmonary Disease (COPD), Hypertension, (COPD), Hypertension, Type II Diabetes Type II Diabetes Date Acquired: 02/14/2017 02/14/2017 N/A Weeks of Treatment: 7 7 N/A Wound Status: Healed - Epithelialized Healed - Epithelialized N/A Measurements L x W x D 0x0x0 0x0x0 N/A (cm) Area (cm) : 0 0 N/A Volume (cm) : 0 0 N/A % Reduction in Area: 100.00% 100.00% N/A % Reduction in Volume: 100.00% 100.00% N/A Classification: Grade 1 Grade 1 N/A Exudate Amount: None Present None Present N/A Wound Margin: Indistinct, nonvisible Indistinct, nonvisible N/A Granulation Amount: None Present (0%) None Present (0%) N/A Necrotic Amount: None Present (0%) None Present (0%) N/A Epithelialization: Large (67-100%) Large (67-100%) N/A Heiman, Annalynn C. ([id number]) Periwound Skin Texture: Excoriation: No Excoriation: No N/A Induration: No Induration: No Callus: No Callus: No Crepitus: No Crepitus: No Rash: No Rash: No Scarring: No Scarring: No Periwound Skin Maceration: No Maceration: No N/A Moisture: Dry/Scaly: No Dry/Scaly: No Periwound Skin Color: Atrophie  Blanche: No Atrophie Blanche: No N/A Cyanosis: No Cyanosis: No Ecchymosis: No Ecchymosis: No Erythema: No Erythema: No Hemosiderin Staining: No Hemosiderin Staining: No Mottled: No Mottled: No Pallor: No Pallor: No Rubor: No Rubor: No Tenderness on No No N/A Palpation: Treatment Notes Electronic Signature(s) Signed: 04/10/2017 4:10:10 PM By: Evlyn Kanner MD, FACS Entered By: Evlyn Kanner on 04/10/2017 15:07:19 [name] ([id number]) -------------------------------------------------------------------------------- Pain Assessment Details [name] Date of Service: 04/10/2017 3:00 PM Medical Record Number: [id number] Patient Account Number: [id number] Date of Birth/Sex: March 01, 1946 (71 y.o. Female) Treating RN: Huel Coventry Primary Care Laterrian Hevener: Milon Score Other Clinician: Referring Taisley Mordan: Referral, Self Treating Mersades Barbaro/Extender: Rudene Re in Treatment: 7 Active Problems Location of Pain Severity and Description of Pain Patient Has Paino No Site Locations With Dressing Change: No Pain Management and Medication Current Pain Management: Goals for Pain Management Topical or injectable lidocaine is offered to patient for acute pain when surgical debridement is performed. If needed, Patient is instructed to use over the counter pain medication for the following 24-48 hours after debridement. Wound care MDs do not prescribed pain medications. Patient has chronic pain or uncontrolled pain. Patient has been instructed to make an appointment with their Primary Care Physician for pain management. Electronic Signature(s) Signed: 04/10/2017 4:44:24 PM By: Elliot Gurney, BSN, RN, CWS, Kim RN, BSN Entered By: Elliot Gurney, BSN, RN, CWS, Kim on 04/10/2017 14:53:05 [name] ([id number]) -------------------------------------------------------------------------------- Patient/Caregiver Education Details [name] Date of Service:  04/10/2017 3:00 PM Medical Record Number: [id number] Patient Account Number: [id number] Date of Birth/Gender: January 31, 1946 (71 y.o. Female) Treating RN: Huel Coventry Primary Care Physician: Milon Score Other Clinician: Referring Physician: Referral, Self Treating Physician/Extender: Rudene Re in Treatment: 7 Education Assessment Education Provided To: Patient Education Topics Provided Wound/Skin Impairment: Handouts: Caring for Your Ulcer, Other: keep pressure on wounds Methods: Demonstration Responses: State content correctly Electronic Signature(s) Signed: 04/10/2017 4:44:24 PM By: Elliot Gurney, BSN, RN, CWS, Kim RN, BSN Entered By: Elliot Gurney, BSN, RN, CWS, Kim on 04/10/2017 15:07:55 [name] ([id number]) -------------------------------------------------------------------------------- Wound Assessment Details [name] Date of Service: 04/10/2017 3:00 PM Medical Record Number: [id number] Patient Account Number: [id number] Date of Birth/Sex: 1945-07-20 (71 y.o. Female) Treating RN: Huel Coventry Primary Care Phong Isenberg: Milon Score Other Clinician: Referring Courtne Lighty: Referral, Self Treating Saadiq Poche/Extender: Rudene Re in Treatment: 7 Wound Status  Wound Number: 2 Primary Diabetic Wound/Ulcer of the Lower Etiology: Extremity Wound Location: Right Lower Leg - Anterior, Distal Wound Healed - Epithelialized Status: Wounding Event: Gradually Appeared Comorbid Chronic Obstructive Pulmonary Date Acquired: 02/14/2017 History: Disease (COPD), Hypertension, Type Weeks Of Treatment: 7 II Diabetes Clustered Wound: No Photos Wound Measurements Length: (cm) 0 % Reduction Width: (cm) 0 % Reduction Depth: (cm) 0 Epithelializ Area: (cm) 0 Tunneling: Volume: (cm) 0 Undermining in Area: 100% in Volume: 100% ation: Large (67-100%) No : No Wound Description Classification: Grade 1 Wound Margin: Indistinct, nonvisible Exudate Amount: None  Present Foul Odor After Cleansing: No Slough/Fibrino No Wound Bed Granulation Amount: None Present (0%) Necrotic Amount: None Present (0%) Periwound Skin Texture Texture Color No Abnormalities Noted: No No Abnormalities Noted: No Callus: No Atrophie Blanche: No Crepitus: No Cyanosis: No Excoriation: No Ecchymosis: No Bursch, Chancey C. ([id number]) Induration: No Erythema: No Rash: No Hemosiderin Staining: No Scarring: No Mottled: No Pallor: No Moisture Rubor: No No Abnormalities Noted: No Dry / Scaly: No Maceration: No Electronic Signature(s) Signed: 04/10/2017 4:44:24 PM By: Elliot Gurney, BSN, RN, CWS, Kim RN, BSN Entered By: Elliot Gurney, BSN, RN, CWS, Kim on 04/10/2017 14:58:33 [name] ([id number]) -------------------------------------------------------------------------------- Wound Assessment Details [name] Date of Service: 04/10/2017 3:00 PM Medical Record Number: [id number] Patient Account Number: [id number] Date of Birth/Sex: [birth year] (71 y.o. Female) Treating RN: Huel Coventry Primary Care Charlie Char: Milon Score Other Clinician: Referring Deni Lefever: Referral, Self Treating [place] Flight/Extender: Rudene Re in Treatment: 7 Wound Status Wound Number: 3 Primary Diabetic Wound/Ulcer of the Lower Etiology: Extremity Wound Location: Right Lower Leg - Anterior, Proximal Wound Healed - Epithelialized Status: Wounding Event: Gradually Appeared Comorbid Chronic Obstructive Pulmonary Date Acquired: 02/14/2017 History: Disease (COPD), Hypertension, Type Weeks Of Treatment: 7 II Diabetes Clustered Wound: No Photos Wound Measurements Length: (cm) 0 % Reduction i Width: (cm) 0 % Reduction i Depth: (cm) 0 Epithelializa Area: (cm) 0 Tunneling: Volume: (cm) 0 Undermining: n Area: 100% n Volume: 100% tion: Large (67-100%) No No Wound Description Classification: Grade 1 Wound Margin: Indistinct, nonvisible Exudate Amount: None  Present Foul Odor After Cleansing: No Slough/Fibrino No Wound Bed Granulation Amount: None Present (0%) Exposed Structure Necrotic Amount: None Present (0%) Fascia Exposed: No Fat Layer (Subcutaneous Tissue) Exposed: No Tendon Exposed: No Muscle Exposed: No Joint Exposed: No Bone Exposed: No Periwound Skin Texture Gural, Tamana C. ([id number]) Texture Color No Abnormalities Noted: No No Abnormalities Noted: No Callus: No Atrophie Blanche: No Crepitus: No Cyanosis: No Excoriation: No Ecchymosis: No Induration: No Erythema: No Rash: No Hemosiderin Staining: No Scarring: No Mottled: No Pallor: No Moisture Rubor: No No Abnormalities Noted: No Dry / Scaly: No Maceration: No Electronic Signature(s) Signed: 04/10/2017 4:44:24 PM By: Elliot Gurney, BSN, RN, CWS, Kim RN, BSN Entered By: Elliot Gurney, BSN, RN, CWS, Kim on 04/10/2017 15:05:19 PIA, JEDLICKA ([id number]) -------------------------------------------------------------------------------- Vitals Details [name] Date of Service: 04/10/2017 3:00 PM Medical Record Number: [id number] Patient Account Number: [id number] Date of Birth/Sex: [birth year] (71 y.o. Female) Treating RN: Huel Coventry Primary Care Zailey Audia: Milon Score Other Clinician: Referring Kenijah Benning[place]: Referral, Self Treating Achaia Garlock/Extender: Rudene Re in Treatment: 7 Vital Signs Time Taken: 14:53 Temperature (F): 98.0 Height (in): 67 Pulse (bpm): 77 Weight (lbs): 202 Respiratory Rate (breaths/min): 16 Body Mass Index (BMI): 31.6 Blood Pressure (mmHg): 91/75 Reference Range: 80 - 120 mg / dl Electronic Signature(s) Signed: 04/10/2017 4:44:24 PM By: Elliot Gurney, BSN, RN, CWS, Kim RN, BSN Entered By: Elliot Gurney, BSN, RN, CWS, Kim  on 04/10/2017 14:56:01

## 2017-07-31 ENCOUNTER — Other Ambulatory Visit: Payer: Self-pay | Admitting: Specialist

## 2017-07-31 DIAGNOSIS — R918 Other nonspecific abnormal finding of lung field: Secondary | ICD-10-CM

## 2017-08-13 ENCOUNTER — Other Ambulatory Visit: Payer: Self-pay | Admitting: Obstetrics and Gynecology

## 2017-08-13 DIAGNOSIS — Z1231 Encounter for screening mammogram for malignant neoplasm of breast: Secondary | ICD-10-CM

## 2017-09-02 ENCOUNTER — Ambulatory Visit
Admission: RE | Admit: 2017-09-02 | Discharge: 2017-09-02 | Disposition: A | Discharge status: Home / Self Care | Payer: Medicare Other | Source: Ambulatory Visit | Attending: Obstetrics and Gynecology | Admitting: Obstetrics and Gynecology

## 2017-09-02 DIAGNOSIS — Z1231 Encounter for screening mammogram for malignant neoplasm of breast: Secondary | ICD-10-CM | POA: Diagnosis not present

## 2017-12-29 ENCOUNTER — Encounter (INDEPENDENT_AMBULATORY_CARE_PROVIDER_SITE_OTHER): Payer: Self-pay

## 2017-12-29 ENCOUNTER — Ambulatory Visit
Admission: RE | Admit: 2017-12-29 | Discharge: 2017-12-29 | Disposition: A | Discharge status: Home / Self Care | Payer: Medicare Other | Source: Ambulatory Visit | Attending: Specialist | Admitting: Specialist

## 2017-12-29 DIAGNOSIS — J439 Emphysema, unspecified: Secondary | ICD-10-CM | POA: Diagnosis not present

## 2017-12-29 DIAGNOSIS — I700 Atherosclerosis of aorta: Secondary | ICD-10-CM | POA: Insufficient documentation

## 2017-12-29 DIAGNOSIS — R918 Other nonspecific abnormal finding of lung field: Secondary | ICD-10-CM | POA: Diagnosis not present

## 2018-08-28 ENCOUNTER — Telehealth (HOSPITAL_COMMUNITY): Payer: Self-pay

## 2018-08-28 ENCOUNTER — Other Ambulatory Visit (HOSPITAL_COMMUNITY): Payer: Self-pay | Admitting: Specialist

## 2018-08-28 DIAGNOSIS — R918 Other nonspecific abnormal finding of lung field: Secondary | ICD-10-CM

## 2018-09-01 ENCOUNTER — Ambulatory Visit
Admission: RE | Admit: 2018-09-01 | Discharge: 2018-09-01 | Disposition: A | Discharge status: Home / Self Care | Payer: Medicare Other | Source: Ambulatory Visit | Attending: Specialist | Admitting: Specialist

## 2018-09-01 DIAGNOSIS — R918 Other nonspecific abnormal finding of lung field: Secondary | ICD-10-CM

## 2018-09-04 ENCOUNTER — Other Ambulatory Visit (HOSPITAL_COMMUNITY): Payer: Self-pay | Admitting: Specialist

## 2018-09-04 ENCOUNTER — Other Ambulatory Visit: Payer: Self-pay | Admitting: Specialist

## 2018-09-04 DIAGNOSIS — R918 Other nonspecific abnormal finding of lung field: Secondary | ICD-10-CM

## 2018-09-30 IMAGING — CT CT CHEST W/O CM
1 series · 15 of 34 positions shown, 19 images · non-contrast
Comparison: 12/31/2016

CLINICAL DATA: Follow-up of pulmonary nodules. Twenty-five
pack-year smoking history.

EXAM:
CT CHEST WITHOUT CONTRAST
TECHNIQUE: Multidetector CT imaging of the chest was performed following the
standard protocol without IV contrast.

[Series 2: thorax · axial · 0.75mm/px · z∈[-313,-31]mm · 15 of 167 slices shown, 19 images]
[im 13/167  mediastinal]
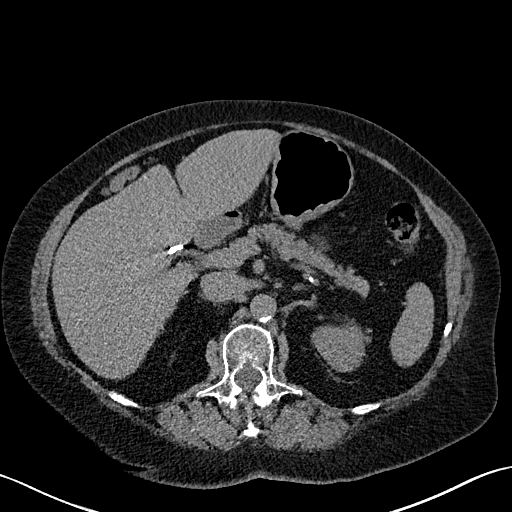
[im 13/167  lung]
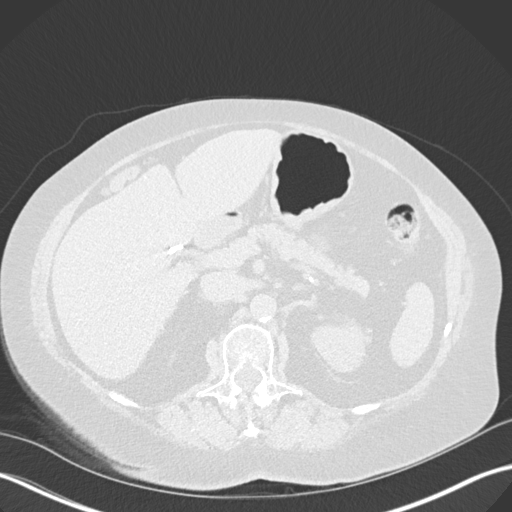
[im 25/167  lung]
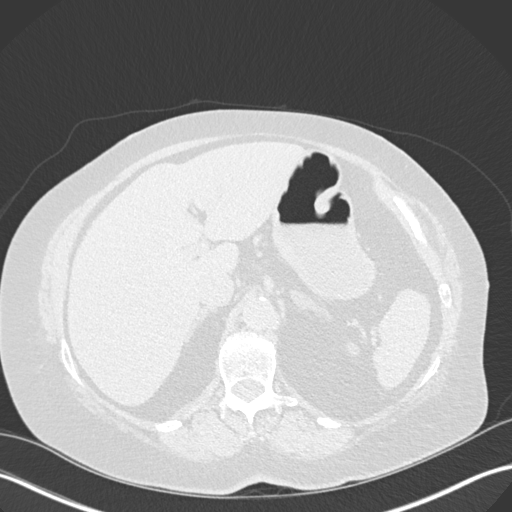
[im 34/167  lung]
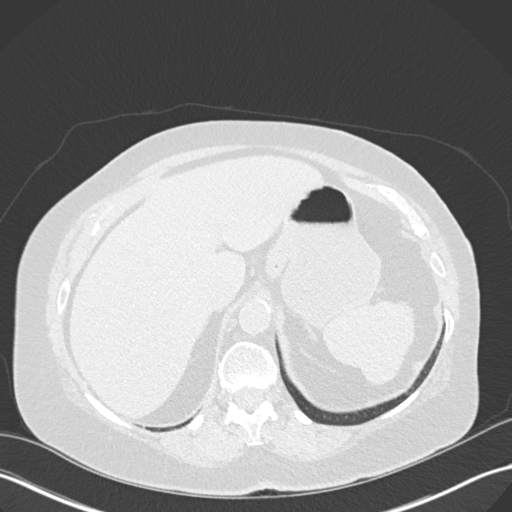
[im 44/167  lung]
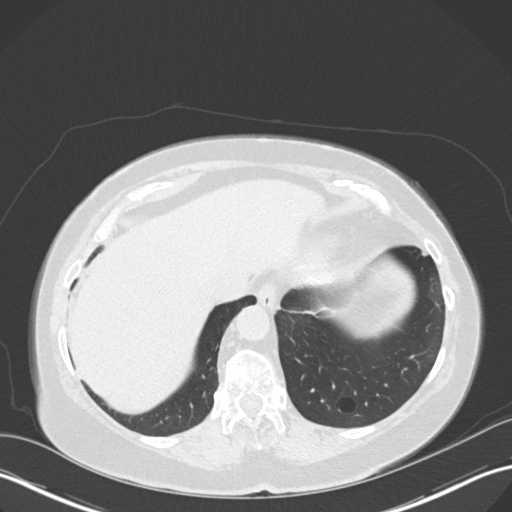
[im 56/167  mediastinal]
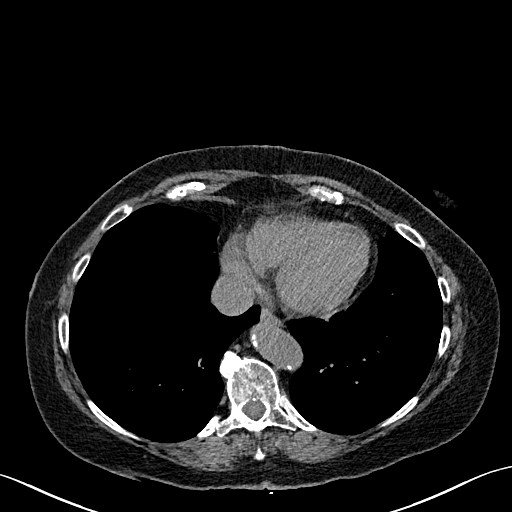
[im 56/167  lung]
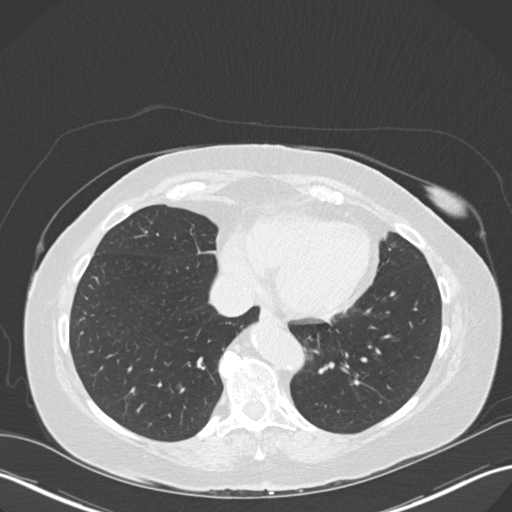
[im 67/167  lung]
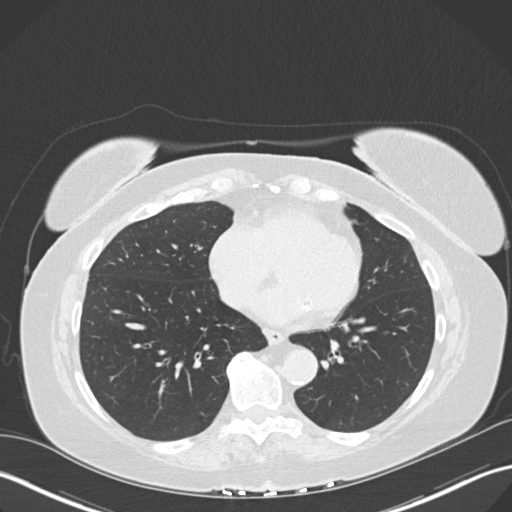
[im 74/167  lung]
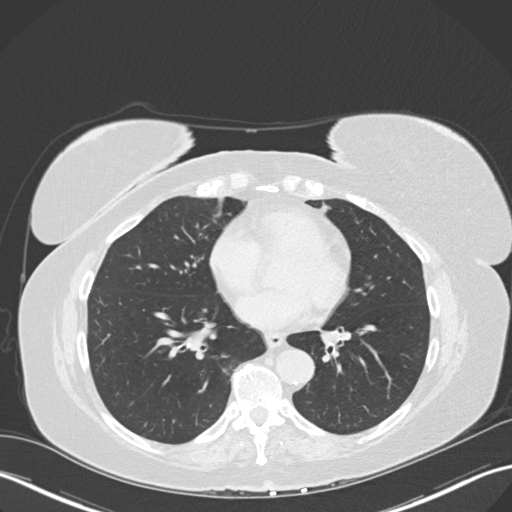
[im 87/167  lung]
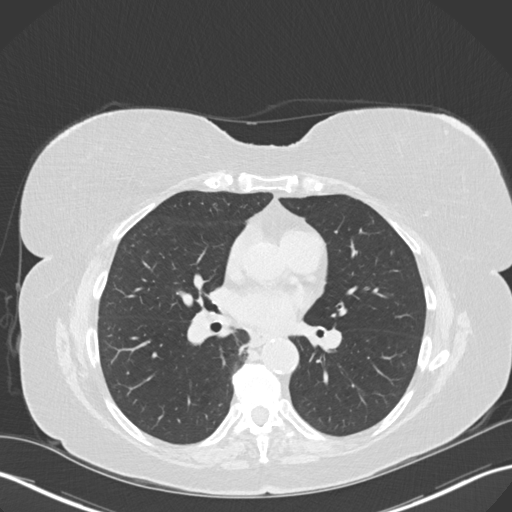
[im 93/167  mediastinal]
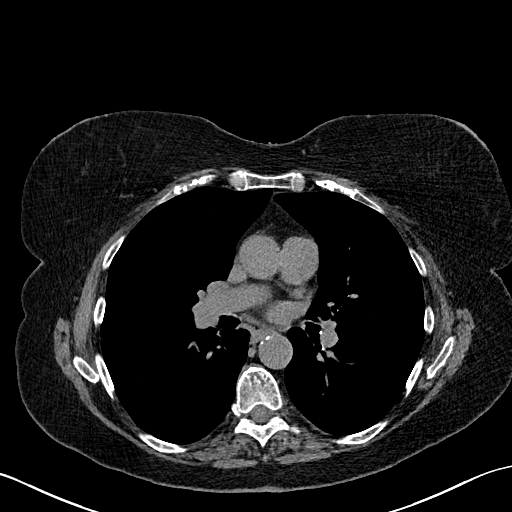
[im 93/167  lung]
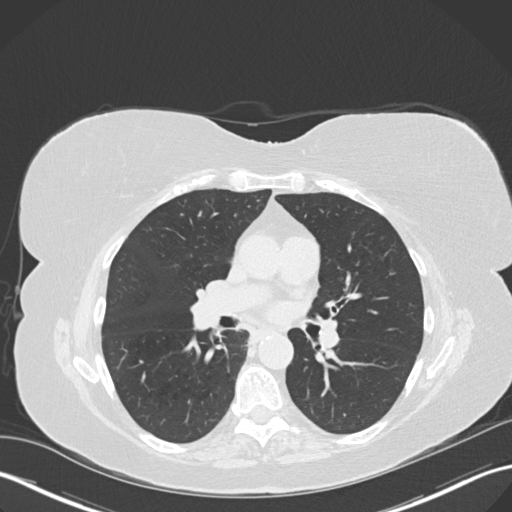
[im 100/167  lung]
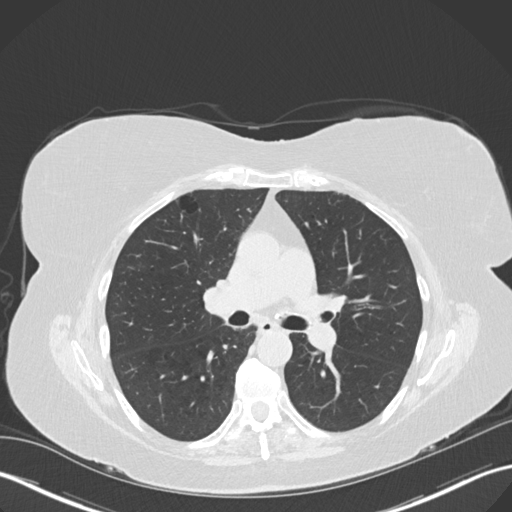
[im 111/167  lung]
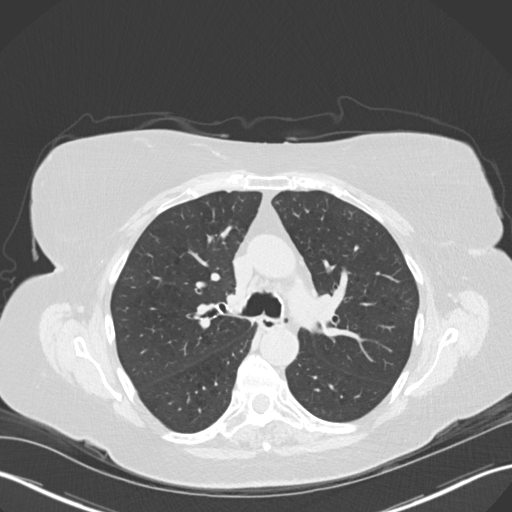
[im 123/167  lung]
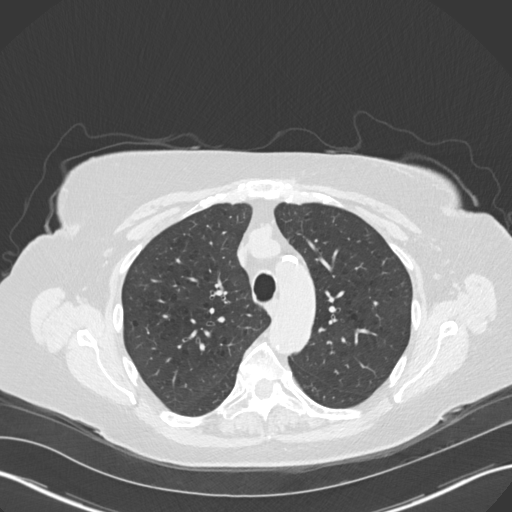
[im 133/167  mediastinal]
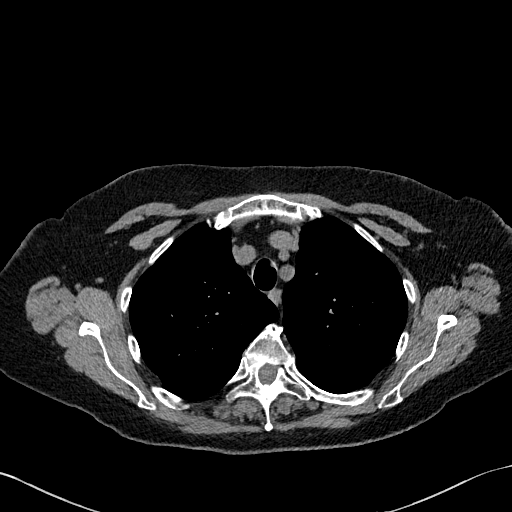
[im 133/167  lung]
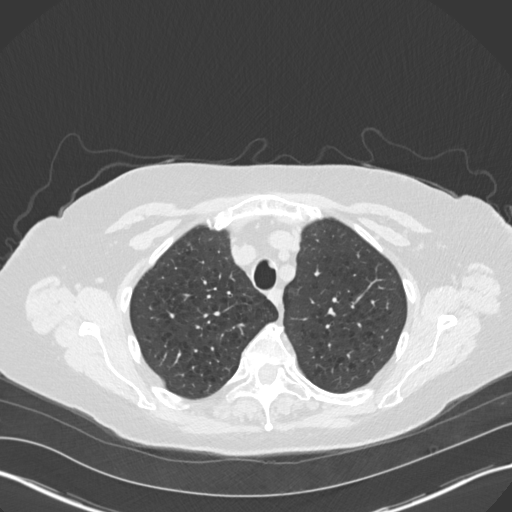
[im 142/167  lung]
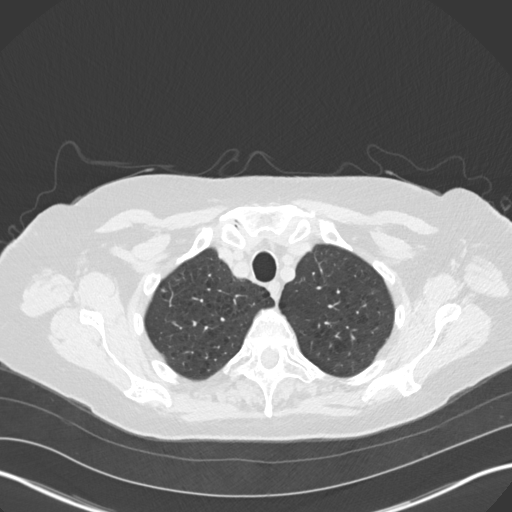
[im 154/167  lung]
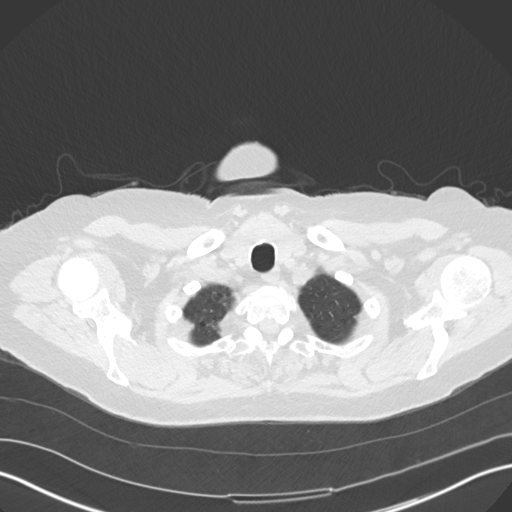

[15 of 34 positions shown; findings below may reference images not displayed]

FINDINGS: Cardiovascular: Aortic atherosclerosis. Tortuous thoracic aorta.
Normal heart size, without pericardial effusion.

Mediastinum/Nodes: No mediastinal or definite hilar adenopathy,
given limitations of unenhanced CT.

Lungs/Pleura: No pleural fluid.  Moderate centrilobular emphysema.

Redemonstration of tiny subpleural pulmonary nodules.

-right upper lobe at 2 mm on image [DATE]

-right upper lobe at 3 mm on image 59/3

-right lower lobe at 3 mm on image 105/3

-left lower lobe at 2 mm on image 112/3.

These are all unchanged. No enlarging or suspicious pulmonary
nodules.

Upper Abdomen: Cholecystectomy. Normal imaged portions of the
spleen, stomach, liver, pancreas, right adrenal gland, left kidney.
Incompletely imaged upper pole right renal low-density lesion is
likely a cyst. Mild left adrenal thickening, similar. Abdominal
aortic atherosclerosis.

Musculoskeletal: Mild osteopenia. Lower cervical spine fixation.
Moderate thoracic spondylosis.
IMPRESSION: 1. Ongoing stability of bilateral pulmonary nodules, which can be
presumed benign. These are most likely subpleural lymph nodes.
2. Aortic atherosclerosis ([id number].N) and emphysema ([id number].L).

## 2021-03-07 ENCOUNTER — Other Ambulatory Visit: Payer: Self-pay | Admitting: Internal Medicine

## 2021-03-07 DIAGNOSIS — Z1231 Encounter for screening mammogram for malignant neoplasm of breast: Secondary | ICD-10-CM

## 2021-03-28 LAB — COLOGUARD: COLOGUARD: NEGATIVE

## 2021-03-28 LAB — EXTERNAL GENERIC LAB PROCEDURE: COLOGUARD: NEGATIVE

## 2021-05-16 ENCOUNTER — Encounter: Payer: Medicare Other | Attending: Internal Medicine | Admitting: Internal Medicine

## 2021-05-16 ENCOUNTER — Other Ambulatory Visit: Payer: Self-pay

## 2021-05-16 DIAGNOSIS — E11622 Type 2 diabetes mellitus with other skin ulcer: Secondary | ICD-10-CM | POA: Diagnosis not present

## 2021-05-16 DIAGNOSIS — L97822 Non-pressure chronic ulcer of other part of left lower leg with fat layer exposed: Secondary | ICD-10-CM | POA: Insufficient documentation

## 2021-05-16 DIAGNOSIS — Z7984 Long term (current) use of oral hypoglycemic drugs: Secondary | ICD-10-CM | POA: Diagnosis not present

## 2021-05-16 DIAGNOSIS — Z87891 Personal history of nicotine dependence: Secondary | ICD-10-CM | POA: Diagnosis not present

## 2021-05-16 DIAGNOSIS — I10 Essential (primary) hypertension: Secondary | ICD-10-CM | POA: Insufficient documentation

## 2021-05-16 NOTE — Progress Notes (Signed)
TIANNAH, PAGANINI ([id number]) Visit Report for 05/16/2021 Chief Complaint Document Details [name], [name] Date of Service: 05/16/2021 12:45 PM Medical Record Number: [id number] Patient Account Number: [id number] Date of Birth/Sex: 1946-02-21 (75 y.o. F) Treating RN: Cornell Barman Primary Care Provider: Glenis Smoker Other Clinician: Referring Provider: Referral, Self Treating Provider/Extender: Yaakov Guthrie in Treatment: 0 Information Obtained from: Patient Chief Complaint Patient presents for a left knee wound status post fall Electronic Signature(s) Signed: 05/16/2021 2:12:57 PM By: Kalman Shan DO Entered By: Kalman Shan on 05/16/2021 14:01:55 [name] ([id number]) -------------------------------------------------------------------------------- Debridement Details Patient Name: [name] Date of Service: 05/16/2021 12:45 PM Medical Record Number: [id number] Patient Account Number: [id number] Date of Birth/Sex: 04/25/1946 (75 y.o. F) Treating RN: Cornell Barman Primary Care Provider: Glenis Smoker Other Clinician: Referring Provider: Referral, Self Treating Provider/Extender: Yaakov Guthrie in Treatment: 0 Debridement Performed for Wound #4 Left Knee Assessment: Performed By: Physician Kalman Shan, MD Debridement Type: Debridement Level of Consciousness (Pre- Awake and Alert procedure): Pre-procedure Verification/Time Out Yes - 13:35 Taken: Total Area Debrided (L x W): 0.7 (cm) x 0.8 (cm) = 0.56 (cm) Tissue and other material Non-Viable, Blood Clots debrided: Level: Non-Viable Tissue Debridement Description: Selective/Open Wound Instrument: Curette, Other : pressure Bleeding: Large Hemostasis Achieved: Pressure Response to Treatment: Procedure was tolerated well Level of Consciousness (Post- Awake and Alert procedure): Post Debridement Measurements of Total Wound Length: (cm) 0.7 Width: (cm) 0.8 Depth: (cm)  1.8 Volume: (cm) 0.792 Character of Wound/Ulcer Post Debridement: Stable Post Procedure Diagnosis Same as Pre-procedure Electronic Signature(s) Signed: 05/16/2021 2:12:57 PM By: Kalman Shan DO Signed: 05/16/2021 4:39:55 PM By: Gretta Cool, BSN, RN, CWS, Kim RN, BSN Entered By: Gretta Cool, BSN, RN, CWS, Kim on 05/16/2021 13:41:49 GLYNNIS, QUALLS ([id number]) -------------------------------------------------------------------------------- HPI Details Patient Name: [name] Date of Service: 05/16/2021 12:45 PM Medical Record Number: [id number] Patient Account Number: [id number] Date of Birth/Sex: [birth year] (75 y.o. F) Treating RN: Cornell Barman Primary Care Provider: Glenis Smoker Other Clinician: Referring Provider: Referral, Self Treating Provider/Extender: Yaakov Guthrie in Treatment: 0 History of Present Illness Location: right lower extremity in the area of the lateral calf below the knee Quality: She is having no pain Context: The wound occurred when the patient a blunt injury and fall injury Modifying Factors: None as patient was healed Associated Signs and Symptoms: Patient reports having increase discharge. HPI Description: Admission 05/16/2021 Ms. Sharian Trotter is a 75 year old female with a past medical history of type 2 diabetes controlled on metformin and essential hypertension that presents to the clinic for a right knee wound. She states that 6 weeks ago she fell tripping over her ottoman onto her left knee. She states that she put ice on the area and has not used any other treatment modalities. She presents today with scabs on her knee. She states she actually did not want to follow-up but her son recommended it. She has not seen any other healthcare provider for this issue. She has not been on antibiotics for this. She currently denies pain or signs of infection. Electronic Signature(s) Signed: 05/16/2021 2:12:57 PM By: Kalman Shan DO Entered By: Kalman Shan  on 05/16/2021 14:06:42 [name] ([id number]) -------------------------------------------------------------------------------- Physical Exam Details Patient Name: [name] Date of Service: 05/16/2021 12:45 PM Medical Record Number: [id number] Patient Account Number: [id number] Date of Birth/Sex: Feb 06, 1946 (75 y.o. F) Treating RN: Cornell Barman Primary Care Provider: Glenis Smoker Other Clinician: Referring Provider: Referral, Self Treating Provider/Extender: Yaakov Guthrie in Treatment:  0 Constitutional . Cardiovascular . Psychiatric . Notes Left knee with scabs. Once these were removed there was 1 open wound present. On palpation there was copious amounts of coagulated blood that immersed. No purulent drainage. No surrounding signs of infection. There is circumferential undermining with largest depth of 5 cm at the 5 o'clock position. No tenderness on exam. Electronic Signature(s) Signed: 05/16/2021 2:12:57 PM By: Kalman Shan DO Entered By: Kalman Shan on 05/16/2021 14:08:09 XZANDRIA, MAYERHOFER ([id number]) -------------------------------------------------------------------------------- Physician Orders Details Patient Name: [name] Date of Service: 05/16/2021 12:45 PM Medical Record Number: [id number] Patient Account Number: [id number] Date of Birth/Sex: 1946/06/09 (75 y.o. F) Treating RN: Cornell Barman Primary Care Provider: Glenis Smoker Other Clinician: Referring Provider: Referral, Self Treating Provider/Extender: Yaakov Guthrie in Treatment: 0 Verbal / Phone Orders: No Diagnosis Coding Follow-up Appointments o Return Appointment in 1 week. Wound Treatment Wound #4 - Knee Wound Laterality: Left Primary Dressing: Iodoform 1/4x 5 (in/yd) 1 x Per Day/15 Days Discharge Instructions: Apply Iodoform Packing Strip as instructed. Secondary Dressing: Gauze (DME) (Generic) 1 x Per Day/15 Days Discharge Instructions: As directed: dry,  moistened with saline or moistened with Dakins Solution Secondary Dressing: [place] Dressing, 4x4 (in/in) (DME) (Generic) 1 x Per Day/15 Days Discharge Instructions: Apply over dressing to secure in place. Radiology o X-ray, knee - 3-view left knee Electronic Signature(s) Signed: 05/16/2021 2:28:22 PM By: Gretta Cool, BSN, RN, CWS, Kim RN, BSN Signed: 05/16/2021 3:01:18 PM By: Kalman Shan DO Previous Signature: 05/16/2021 2:12:57 PM Version By: Kalman Shan DO Entered By: Gretta Cool BSN, RN, CWS, Kim on 05/16/2021 14:28:22 CAMANI, TAUBMAN ([id number]) -------------------------------------------------------------------------------- Prescription 05/16/2021 Patient Name: [name] Provider: Kalman Shan MD Date of Birth: 05/26/1946 NPI#: [id number] Sex: F DEA#: Phone #: [phone] License #: [id number] Patient Address: [facility] [place], [place] [address] [address], [place], [place] [address] [phone] Allergies tramadol; erythromycin estolate Provider's Orders o X-ray, knee - 3-view left knee Hand Signature: Date(s): Electronic Signature(s) Signed: 05/16/2021 3:01:18 PM By: Kalman Shan DO Signed: 05/16/2021 4:39:55 PM By: Gretta Cool, BSN, RN, CWS, Kim RN, BSN Previous Signature: 05/16/2021 2:12:57 PM Version By: Kalman Shan DO Entered By: Gretta Cool BSN, RN, CWS, Kim on 05/16/2021 14:28:22 SHAMMARA, GRANDERSON ([id number]) --------------------------------------------------------------------------------  Problem List Details Patient Name: [name] Date of Service: 05/16/2021 12:45 PM Medical Record Number: [id number] Patient Account Number: [id number] Date of Birth/Sex: 1945-07-20 (75 y.o. F) Treating RN: Cornell Barman Primary Care Provider: Glenis Smoker Other Clinician: Referring Provider: Referral, Self Treating Provider/Extender: Yaakov Guthrie  in Treatment: 0 Active Problems ICD-10 Encounter Code Description Active Date MDM Diagnosis L97.822 Non-pressure chronic ulcer of other part of left lower leg with fat layer 05/16/2021 No Yes exposed E11.622 Type 2 diabetes mellitus with other skin ulcer 05/16/2021 No Yes Inactive Problems Resolved Problems Electronic Signature(s) Signed: 05/16/2021 2:12:57 PM By: Kalman Shan DO Entered By: Kalman Shan on 05/16/2021 14:00:32 [name] ([id number]) -------------------------------------------------------------------------------- Progress Note Details Patient Name: [name] Date of Service: 05/16/2021 12:45 PM Medical Record Number: [id number] Patient Account Number: [id number] Date of Birth/Sex: [birth year] (75 y.o. F) Treating RN: Cornell Barman Primary Care Provider: Glenis Smoker Other Clinician: Referring Provider: Referral, Self Treating Provider/Extender: Yaakov Guthrie in Treatment: 0 Subjective Chief Complaint Information obtained from Patient Patient presents for a left knee wound status post fall History of Present Illness (HPI) The following HPI elements were documented for the  patient's wound: Location: right lower extremity in the area of the lateral calf below the knee Quality: She is having no pain Context: The wound occurred when the patient a blunt injury and fall injury Modifying Factors: None as patient was healed Associated Signs and Symptoms: Patient reports having increase discharge. Admission 05/16/2021 Ms. Anndrea Stokley is a 75 year old female with a past medical history of type 2 diabetes controlled on metformin and essential hypertension that presents to the clinic for a right knee wound. She states that 6 weeks ago she fell tripping over her ottoman onto her left knee. She states that she put ice on the area and has not used any other treatment modalities. She presents today with scabs on her knee. She states she actually did not  want to follow-up but her son recommended it. She has not seen any other healthcare provider for this issue. She has not been on antibiotics for this. She currently denies pain or signs of infection. Patient History Information obtained from Patient. Allergies tramadol, erythromycin estolate Social History Former smoker, Marital Status - Married, Alcohol Use - Never, Drug Use - No History, Caffeine Use - Daily. Medical History Eyes Denies history of Cataracts, Glaucoma, Optic Neuritis Ear/Nose/Mouth/Throat Denies history of Chronic sinus problems/congestion, Middle ear problems Hematologic/Lymphatic Denies history of Anemia, Hemophilia, Human Immunodeficiency Virus, Lymphedema, Sickle Cell Disease Respiratory Patient has history of Chronic Obstructive Pulmonary Disease (COPD) Denies history of Aspiration, Asthma, Pneumothorax, Sleep Apnea, Tuberculosis Cardiovascular Patient has history of Hypertension Denies history of Angina, Arrhythmia, Congestive Heart Failure, Coronary Artery Disease, Deep Vein Thrombosis, Hypotension, Myocardial Infarction, Peripheral Arterial Disease, Peripheral Venous Disease, Phlebitis, Vasculitis Gastrointestinal Denies history of Cirrhosis , Colitis, Crohn s, Hepatitis A, Hepatitis B, Hepatitis C Endocrine Patient has history of Type II Diabetes Denies history of Type I Diabetes Immunological Denies history of Lupus Erythematosus, Raynaud s, Scleroderma Integumentary (Skin) Denies history of History of Burn, History of pressure wounds Musculoskeletal Denies history of Gout, Rheumatoid Arthritis, Osteoarthritis, Osteomyelitis Neurologic Denies history of Dementia, Neuropathy, Quadriplegia, Paraplegia, Seizure Disorder Oncologic Denies history of Received Chemotherapy, Received Radiation Medical And Surgical History Notes Cardiovascular hx SVT - followed by Dr Ubaldo Glassing Review of Systems (ROS) [place], [place]. ([id number]) Endocrine Complains or has  symptoms of Thyroid disease. Objective Constitutional Vitals Time Taken: 12:59 PM, Height: 67 in, Weight: 163 lbs, BMI: 25.5, Temperature: 98.1 F, Pulse: 76 bpm, Respiratory Rate: 16 breaths/min, Blood Pressure: 151/68 mmHg. General Notes: Left knee with scabs. Once these were removed there was 1 open wound present. On palpation there was copious amounts of coagulated blood that immersed. No purulent drainage. No surrounding signs of infection. There is circumferential undermining with largest depth of 5 cm at the 5 o'clock position. No tenderness on exam. Integumentary (Hair, Skin) Wound #4 status is Open. Original cause of wound was Trauma. The date acquired was: 04/13/2021. The wound is located on the Left Knee. The wound measures 0.7cm length x 0.8cm width x 1.6cm depth; 0.44cm^2 area and 0.704cm^3 volume. There is Fat Layer (Subcutaneous Tissue) exposed. There is no tunneling noted, however, there is undermining starting at 1:00 and ending at 1:00 with a maximum distance of 2.5cm. There is a large amount of sanguinous drainage noted. There is no granulation within the wound bed. There is a large (67-100%) amount of necrotic tissue within the wound bed including Eschar. Assessment Active Problems ICD-10 Non-pressure chronic ulcer of other part of left lower leg with fat layer exposed Type 2 diabetes mellitus with other  skin ulcer Patient presents with a 1 month history of wound to her left knee that she was unaware of because there was a scab in place. When this was removed today there was copious amounts of coagulated blood that immersed. This was thoroughly cleaned out. There is significant undermining circumferentially. I recommended using iodoform packing 1-2 times daily depending on drainage. Also recommended a left knee x-ray due to the unawareness of the wound and chronicity. There were no signs of infection on exam. I recommend she call our office if she develops symptoms of  increased warmth, pain or erythema to the wound area. Follow-up in 1 week 45 minutes was spent on the encounter including face-to-face, EMR review and coordination of care Procedures Wound #4 Pre-procedure diagnosis of Wound #4 is a Trauma, Other located on the Left Knee . There was a Selective/Open Wound Non-Viable Tissue Debridement with a total area of 0.56 sq cm performed by Geralyn Corwin, MD. With the following instrument(s): Curette, pressure to remove Non-Viable tissue/material. Material removed includes Blood Clots. A time out was conducted at 13:35, prior to the start of the procedure. A Large amount of bleeding was controlled with Pressure. The procedure was tolerated well. Post Debridement Measurements: 0.7cm length x 0.8cm width x 1.8cm depth; 0.792cm^3 volume. Character of Wound/Ulcer Post Debridement is stable. Post procedure Diagnosis Wound #4: Same as Pre-Procedure Plan DREMA, EDDINGTON ([id number]) Follow-up Appointments: Return Appointment in 1 week. Radiology ordered were: X-ray, knee - 3-view left knee WOUND #4: - Knee Wound Laterality: Left Primary Dressing: Iodoform 1/4x 5 (in/yd) 1 x Per Day/15 Days Discharge Instructions: Apply Iodoform Packing Strip as instructed. Secondary Dressing: Gauze 1 x Per Day/15 Days Discharge Instructions: As directed: dry, moistened with saline or moistened with Dakins Solution Secondary Dressing: [place] Dressing, 4x4 (in/in) 1 x Per Day/15 Days Discharge Instructions: Apply over dressing to secure in place. 1. Iodoform packing 2. Follow-up in 1 week 3. left knee x-ray Electronic Signature(s) Signed: 05/16/2021 2:12:57 PM By: Geralyn Corwin DO Entered By: Geralyn Corwin on 05/16/2021 14:12:08 [name] ([id number]) -------------------------------------------------------------------------------- ROS/PFSH Details Patient Name: [name] Date of Service: 05/16/2021 12:45 PM Medical Record Number:  [id number] Patient Account Number: [id number] Date of Birth/Sex: 1945/11/28 (75 y.o. F) Treating RN: Huel Coventry Primary Care Provider: Milon Score Other Clinician: Referring Provider: Referral, Self Treating Provider/Extender: Tilda Franco in Treatment: 0 Information Obtained From Patient Endocrine Complaints and Symptoms: Positive for: Thyroid disease Medical History: Positive for: Type II Diabetes Negative for: Type I Diabetes Time with diabetes: 10 years Treated with: Oral agents Eyes Medical History: Negative for: Cataracts; Glaucoma; Optic Neuritis Ear/Nose/Mouth/Throat Medical History: Negative for: Chronic sinus problems/congestion; Middle ear problems Hematologic/Lymphatic Medical History: Negative for: Anemia; Hemophilia; Human Immunodeficiency Virus; Lymphedema; Sickle Cell Disease Respiratory Medical History: Positive for: Chronic Obstructive Pulmonary Disease (COPD) Negative for: Aspiration; Asthma; Pneumothorax; Sleep Apnea; Tuberculosis Cardiovascular Medical History: Positive for: Hypertension Negative for: Angina; Arrhythmia; Congestive Heart Failure; Coronary Artery Disease; Deep Vein Thrombosis; Hypotension; Myocardial Infarction; Peripheral Arterial Disease; Peripheral Venous Disease; Phlebitis; Vasculitis Past Medical History Notes: hx SVT - followed by Dr Lady Gary Gastrointestinal Medical History: Negative for: Cirrhosis ; Colitis; Crohnos; Hepatitis A; Hepatitis B; Hepatitis C Immunological Medical History: Negative for: Lupus Erythematosus; Raynaudos; Scleroderma Integumentary (Skin) Medical History: Negative for: History of Burn; History of pressure wounds Armetta, Selda C. ([id number]) Musculoskeletal Medical History: Negative for: Gout; Rheumatoid Arthritis; Osteoarthritis; Osteomyelitis Neurologic Medical History: Negative for: Dementia; Neuropathy; Quadriplegia; Paraplegia; Seizure Disorder Oncologic  Medical History: Negative for:  Received Chemotherapy; Received Radiation Immunizations Pneumococcal Vaccine: Received Pneumococcal Vaccination: Yes Received Pneumococcal Vaccination On or After 60th Birthday: Yes Immunization Notes: up to date Implantable Devices No devices added Family and Social History Former smoker; Marital Status - Married; Alcohol Use: Never; Drug Use: No History; Caffeine Use: Daily; Financial Concerns: No; Food, Clothing or Shelter Needs: No; Support System Lacking: No; Transportation Concerns: No Electronic Signature(s) Signed: 05/16/2021 2:12:57 PM By: Kalman Shan DO Signed: 05/16/2021 4:39:55 PM By: Gretta Cool, BSN, RN, CWS, Kim RN, BSN Entered By: Gretta Cool, BSN, RN, CWS, Kim on 05/16/2021 13:01:46 LARISHA, FERRING ([id number]) -------------------------------------------------------------------------------- [place] Details Patient Name: [name] Date of Service: 05/16/2021 Medical Record Number: [id number] Patient Account Number: [id number] Date of Birth/Sex: Nov 16, 1945 (75 y.o. F) Treating RN: Cornell Barman Primary Care Provider: Glenis Smoker Other Clinician: Referring Provider: Referral, Self Treating Provider/Extender: Yaakov Guthrie in Treatment: 0 Diagnosis Coding ICD-10 Codes Code Description [id number] Non-pressure chronic ulcer of other part of left lower leg with fat layer exposed E11.622 Type 2 diabetes mellitus with other skin ulcer Facility Procedures CPT4 Code: [id number] Description: [id number] - WOUND CARE VISIT-LEV 3 EST PT Modifier: Quantity: 1 CPT4 Code: [id number] Description: [id number] - DEBRIDE WOUND 1ST 20 SQ CM OR < Modifier: Quantity: 1 CPT4 Code: Description: ICD-10 Diagnosis Description L97.822 Non-pressure chronic ulcer of other part of left lower leg with fat layer E11.622 Type 2 diabetes mellitus with other skin ulcer Modifier: exposed Quantity: Physician Procedures CPT4 Code: [id number] Description: [id number] - WC PHYS LEVEL 4 - NEW PT Modifier: Quantity:  1 CPT4 Code: Description: ICD-10 Diagnosis Description L97.822 Non-pressure chronic ulcer of other part of left lower leg with fat layer E11.622 Type 2 diabetes mellitus with other skin ulcer Modifier: exposed Quantity: CPT4 Code: [id number] Description: [id number] - WC PHYS DEBR WO ANESTH 20 SQ CM Modifier: Quantity: 1 CPT4 Code: Description: ICD-10 Diagnosis Description L97.822 Non-pressure chronic ulcer of other part of left lower leg with fat layer E11.622 Type 2 diabetes mellitus with other skin ulcer Modifier: exposed Quantity: Electronic Signature(s) Signed: 05/16/2021 2:28:48 PM By: Gretta Cool, BSN, RN, CWS, Kim RN, BSN Signed: 05/16/2021 3:01:18 PM By: Kalman Shan DO Previous Signature: 05/16/2021 2:12:57 PM Version By: Kalman Shan DO Entered By: Gretta Cool, BSN, RN, CWS, Kim on 05/16/2021 14:28:47

## 2021-05-16 NOTE — Progress Notes (Signed)
Janet, [name] ([id number]) Visit Report for 05/16/2021 Allergy List Details [name], [name] Date of Service: 05/16/2021 12:45 PM Medical Record Number: [id number] Patient Account Number: [id number] Date of Birth/Sex: [birth year] (75 y.o. F) Treating RN: Cornell Barman Primary Care Makalynn Berwanger: Glenis Smoker Other Clinician: Referring Azaylea Maves: Referral, Self Treating Markee Matera/Extender: Yaakov Guthrie in Treatment: 0 Allergies Active Allergies tramadol erythromycin estolate Allergy Notes Electronic Signature(s) Signed: 05/16/2021 4:39:55 PM By: Gretta Cool, BSN, RN, CWS, Kim RN, BSN Entered By: Gretta Cool, BSN, RN, CWS, Kim on 05/16/2021 13:00:42 LENAY, LOVEJOY ([id number]) -------------------------------------------------------------------------------- Arrival Information Details [name] Date of Service: 05/16/2021 12:45 PM Medical Record Number: [id number] Patient Account Number: [id number] Date of Birth/Sex: 01-30-1946 (75 y.o. F) Treating RN: Cornell Barman Primary Care Evolette Pendell: Glenis Smoker Other Clinician: Referring Tannon Peerson: Referral, Self Treating Skyy Mcknight/Extender: Yaakov Guthrie in Treatment: 0 Visit Information Patient Arrived: Ambulatory Arrival Time: 12:57 Accompanied By: self Transfer Assistance: None Patient Identification Verified: Yes Secondary Verification Process Completed: Yes History Since Last Visit Electronic Signature(s) Signed: 05/16/2021 4:39:55 PM By: Gretta Cool, BSN, RN, CWS, Kim RN, BSN Entered By: Gretta Cool, BSN, RN, CWS, Kim on 05/16/2021 12:59:16 [name] ([id number]) -------------------------------------------------------------------------------- Clinic Level of Care Assessment Details [name] Date of Service: 05/16/2021 12:45 PM Medical Record Number: [id number] Patient Account Number: [id number] Date of Birth/Sex: [birth year] (75 y.o. F) Treating RN: Cornell Barman Primary Care Jaise Moser: Glenis Smoker Other Clinician: Referring Sabian Kuba: Referral, Self Treating Rhythm Gubbels/Extender: Yaakov Guthrie in Treatment: 0 Clinic Level of Care Assessment Items TOOL 1 Quantity Score []  - Use when EandM and Procedure is performed on INITIAL visit 0 ASSESSMENTS - Nursing Assessment / Reassessment X - General Physical Exam (combine w/ comprehensive assessment (listed just below) when performed on new 1 20 pt. evals) X- 1 25 Comprehensive Assessment (HX, ROS, Risk Assessments, Wounds Hx, etc.) ASSESSMENTS - Wound and Skin Assessment / Reassessment []  - Dermatologic / Skin Assessment (not related to wound area) 0 ASSESSMENTS - Ostomy and/or Continence Assessment and Care []  - Incontinence Assessment and Management 0 []  - 0 Ostomy Care Assessment and Management (repouching, etc.) PROCESS - Coordination of Care X - Simple Patient / Family Education for ongoing care 1 15 []  - 0 Complex (extensive) Patient / Family Education for ongoing care X- 1 10 Staff obtains Programmer, systems, Records, Test Results / Process Orders []  - 0 Staff telephones HHA, Nursing Homes / Clarify orders / etc []  - 0 Routine Transfer to another Facility (non-emergent condition) []  - 0 Routine Hospital Admission (non-emergent condition) X- 1 15 New Admissions / Biomedical engineer / Ordering NPWT, Apligraf, etc. []  - 0 Emergency Hospital Admission (emergent condition) PROCESS - Special Needs []  - Pediatric / Minor Patient Management 0 []  - 0 Isolation Patient Management []  - 0 Hearing / Language / Visual special needs []  - 0 Assessment of Community assistance (transportation, D/C planning, etc.) []  - 0 Additional assistance / Altered mentation []  - 0 Support Surface(s) Assessment (bed, cushion, seat, etc.) INTERVENTIONS - Miscellaneous []  - External ear exam 0 []  - 0 Patient Transfer (multiple staff / Civil Service fast streamer / Similar devices) []  - 0 Simple Staple / Suture removal (25 or less) []  - 0 Complex  Staple / Suture removal (26 or more) []  - 0 Hypo/Hyperglycemic Management (do not check if billed separately) []  - 0 Ankle / Brachial Index (ABI) - do not check if billed separately Has the patient been seen at the hospital within the last three  years: Yes Total Score: 85 Level Of Care: New/Established - Level 3 Lazard, Amada C. ([id number]) Electronic Signature(s) Signed: 05/16/2021 4:39:55 PM By: Gretta Cool, BSN, RN, CWS, Kim RN, BSN Entered By: Gretta Cool, BSN, RN, CWS, Kim on 05/16/2021 14:28:36 CYNCERE, RUHE ([id number]) -------------------------------------------------------------------------------- Encounter Discharge Information Details [name] Date of Service: 05/16/2021 12:45 PM Medical Record Number: [id number] Patient Account Number: [id number] Date of Birth/Sex: [birth year] (75 y.o. F) Treating RN: Cornell Barman Primary Care Doneshia Hill: Glenis Smoker Other Clinician: Referring Florian Chauca: Referral, Self Treating Conrado Nance/Extender: Yaakov Guthrie in Treatment: 0 Encounter Discharge Information Items Post Procedure Vitals Discharge Condition: Stable Temperature (F): 98.1 Ambulatory Status: Ambulatory Pulse (bpm): 76 Discharge Destination: Home Respiratory Rate (breaths/min): 16 Transportation: Private Auto Blood Pressure (mmHg): 157/68 Accompanied By: self Schedule Follow-up Appointment: Yes Clinical Summary of Care: Electronic Signature(s) Signed: 05/16/2021 4:39:55 PM By: Gretta Cool, BSN, RN, CWS, Kim RN, BSN Entered By: Gretta Cool, BSN, RN, CWS, Kim on 05/16/2021 14:01:40 [name] ([id number]) -------------------------------------------------------------------------------- Lower Extremity Assessment Details [name] Date of Service: 05/16/2021 12:45 PM Medical Record Number: [id number] Patient Account Number: [id number] Date of Birth/Sex: December 30, 1945 (75 y.o. F) Treating RN: Cornell Barman Primary Care Kade Demicco: Glenis Smoker Other  Clinician: Referring Amala Petion: Referral, Self Treating Kao Berkheimer/Extender: Yaakov Guthrie in Treatment: 0 Edema Assessment Assessed: [Left: Yes] [Right: No] Edema: [Left: N] [Right: o] Vascular Assessment Pulses: Dorsalis Pedis Palpable: [Left:Yes] Electronic Signature(s) Signed: 05/16/2021 4:39:55 PM By: Gretta Cool, BSN, RN, CWS, Kim RN, BSN Entered By: Gretta Cool, BSN, RN, CWS, Kim on 05/16/2021 13:20:29 DIONNE, KNOOP ([id number]) -------------------------------------------------------------------------------- Multi Wound Chart Details [name] Date of Service: 05/16/2021 12:45 PM Medical Record Number: [id number] Patient Account Number: [id number] Date of Birth/Sex: 22-Sep-1945 (75 y.o. F) Treating RN: Cornell Barman Primary Care Evony Rezek: Glenis Smoker Other Clinician: Referring Daelynn Blower: Referral, Self Treating Yoseph Haile/Extender: Yaakov Guthrie in Treatment: 0 Vital Signs Height(in): 49 Pulse(bpm): 42 Weight(lbs): 163 Blood Pressure(mmHg): 151/68 Body Mass Index(BMI): 26 Temperature(F): 98.1 Respiratory Rate(breaths/min): 16 Photos: [N/A:N/A] Wound Location: Left Knee N/A N/A Wounding Event: Trauma N/A N/A Primary Etiology: Trauma, Other N/A N/A Comorbid History: Chronic Obstructive Pulmonary N/A N/A Disease (COPD), Hypertension, Type II Diabetes Date Acquired: 04/13/2021 N/A N/A Weeks of Treatment: 0 N/A N/A Wound Status: Open N/A N/A Clustered Wound: Yes N/A N/A Measurements L x W x D (cm) 0.7x0.8x1.6 N/A N/A Area (cm) : 0.44 N/A N/A Volume (cm) : 0.704 N/A N/A % Reduction in Area: 0.00% N/A N/A % Reduction in Volume: 0.00% N/A N/A Starting Position 1 (o'clock): 1 Ending Position 1 (o'clock): 1 Maximum Distance 1 (cm): 2.5 Undermining: Yes N/A N/A Classification: Full Thickness Without Exposed N/A N/A Support Structures Exudate Amount: Large N/A N/A Exudate Type: Sanguinous N/A N/A Exudate Color: red N/A N/A Granulation Amount:  None Present (0%) N/A N/A Necrotic Amount: Large (67-100%) N/A N/A Necrotic Tissue: Eschar N/A N/A Exposed Structures: Fat Layer (Subcutaneous Tissue): N/A N/A Yes Fascia: No Tendon: No Muscle: No Joint: No Bone: No Epithelialization: None N/A N/A Debridement: Debridement - Selective/Open N/A N/A Wound Pre-procedure Verification/Time 13:35 N/A N/A Out Taken: Tissue Debrided: Blood Clots N/A N/A Level: Non-Viable Tissue N/A N/A Debridement Area (sq cm): 0.56 N/A N/A Instrument: Curette, Other(pressure) N/A N/A Loewenstein, Dorothyann C. ([id number]) Bleeding: Large N/A N/A Hemostasis Achieved: Pressure N/A N/A Debridement Treatment Procedure was tolerated well N/A N/A Response: Post Debridement 0.7x0.8x1.8 N/A N/A Measurements L x W x D (cm) Post Debridement Volume: 0.792 N/A N/A (cm) Procedures Performed:  Debridement N/A N/A Treatment Notes Wound #4 (Knee) Wound Laterality: Left Cleanser Peri-Wound Care Topical Primary Dressing Iodoform 1/4x 5 (in/yd) Discharge Instruction: Apply Iodoform Packing Strip as instructed. Secondary Dressing Gauze Discharge Instruction: As directed: dry, moistened with saline or moistened with Bowman Dressing, 4x4 (in/in) Discharge Instruction: Apply over dressing to secure in place. Secured With Compression Wrap Compression Stockings Add-Ons Electronic Signature(s) Signed: 05/16/2021 2:12:57 PM By: Kalman Shan DO Entered By: Kalman Shan on 05/16/2021 14:00:52 AALEIGHA, BOZZA ([id number]) -------------------------------------------------------------------------------- Multi-Disciplinary Care Plan Details [name] Date of Service: 05/16/2021 12:45 PM Medical Record Number: [id number] Patient Account Number: [id number] Date of Birth/Sex: December 14, 1945 (75 y.o. F) Treating RN: Cornell Barman Primary Care Fidencia Mccloud: Glenis Smoker Other Clinician: Referring Lahela Woodin: Referral, Self Treating  Rhylei Mcquaig/Extender: Yaakov Guthrie in Treatment: 0 Active Inactive Necrotic Tissue Nursing Diagnoses: Impaired tissue integrity related to necrotic/devitalized tissue Knowledge deficit related to management of necrotic/devitalized tissue Goals: Necrotic/devitalized tissue will be minimized in the wound bed Date Initiated: 05/16/2021 Target Resolution Date: 05/16/2021 Goal Status: Active Patient/caregiver will verbalize understanding of reason and process for debridement of necrotic tissue Date Initiated: 05/16/2021 Target Resolution Date: 05/16/2021 Goal Status: Active Interventions: Assess patient pain level pre-, during and post procedure and prior to discharge Provide education on necrotic tissue and debridement process Treatment Activities: Excisional debridement : 05/16/2021 Notes: Orientation to the Wound Care Program Nursing Diagnoses: Knowledge deficit related to the wound healing center program Goals: Patient/caregiver will verbalize understanding of the [place] [facility] Program Date Initiated: 05/16/2021 Target Resolution Date: 05/16/2021 Goal Status: Active Interventions: Provide education on orientation to the wound center Notes: Pain, Acute or Chronic Nursing Diagnoses: Potential alteration in comfort, pain Goals: Patient will verbalize adequate pain control and receive pain control interventions during procedures as needed Date Initiated: 05/16/2021 Target Resolution Date: 05/16/2021 Goal Status: Active Patient/caregiver will verbalize adequate pain control between visits Date Initiated: 05/16/2021 Target Resolution Date: 05/17/2021 Goal Status: Active Patient/caregiver will verbalize comfort level met Date Initiated: 05/16/2021 Target Resolution Date: 05/16/2021 Goal Status: Active CLEMENCIA, HELZER ([id number]) Interventions: Assess comfort goal upon admission Provide education on pain management Treatment Activities: Administer pain control measures  as ordered : 05/16/2021 Refer to pain specialist or other pain support program : 05/16/2021 Notes: Wound/Skin Impairment Nursing Diagnoses: Impaired tissue integrity Knowledge deficit related to smoking impact on wound healing Knowledge deficit related to ulceration/compromised skin integrity Goals: Patient will demonstrate a reduced rate of smoking or cessation of smoking Date Initiated: 05/16/2021 Target Resolution Date: 05/16/2021 Goal Status: Active Ulcer/skin breakdown will have a volume reduction of 30% by week 4 Date Initiated: 05/16/2021 Target Resolution Date: 06/15/2021 Goal Status: Active Interventions: Assess ulceration(s) every visit Treatment Activities: Skin care regimen initiated : 05/16/2021 Topical wound management initiated : 05/16/2021 Notes: Electronic Signature(s) Signed: 05/16/2021 4:39:55 PM By: Gretta Cool, BSN, RN, CWS, Kim RN, BSN Entered By: Gretta Cool, BSN, RN, CWS, Kim on 05/16/2021 13:28:32 MADLINE, OESTERLING ([id number]) -------------------------------------------------------------------------------- Pain Assessment Details [name] Date of Service: 05/16/2021 12:45 PM Medical Record Number: [id number] Patient Account Number: [id number] Date of Birth/Sex: [birth year] (75 y.o. F) Treating RN: Cornell Barman Primary Care Waynetta Metheny: Glenis Smoker Other Clinician: Referring Mckinzee Spirito: Referral, Self Treating Jomo Forand/Extender: Yaakov Guthrie in Treatment: 0 Active Problems Location of Pain Severity and Description of Pain Patient Has Paino No Site Locations Pain Management and Medication Current Pain Management: Notes Patient denies pain at this time. Electronic Signature(s) Signed: 05/16/2021 4:39:55 PM By: Gretta Cool,  BSN, RN, CWS, Kim RN, BSN Entered By: Gretta Cool, BSN, RN, CWS, Kim on 05/16/2021 12:59:41 JULEEN, SORRELS ([id number]) -------------------------------------------------------------------------------- Patient/Caregiver Education  Details [name] Date of Service: 05/16/2021 12:45 PM Medical Record Number: [id number] Patient Account Number: [id number] Date of Birth/Gender: May 09, 1946 (75 y.o. F) Treating RN: Cornell Barman Primary Care Physician: Glenis Smoker Other Clinician: Referring Physician: Referral, Self Treating Physician/Extender: Yaakov Guthrie in Treatment: 0 Education Assessment Education Provided To: Patient Education Topics Provided Welcome To [facility]: Handouts: Welcome [facility] Methods: Demonstration, Explain/Verbal Responses: State content correctly Wound Debridement: Handouts: Wound Debridement Methods: Demonstration, Explain/Verbal Responses: State content correctly Electronic Signature(s) Signed: 05/16/2021 4:39:55 PM By: Gretta Cool, BSN, RN, CWS, Kim RN, BSN Entered By: Gretta Cool, BSN, RN, CWS, Kim on 05/16/2021 14:00:05 GWENDOLYN, MCLEES ([id number]) -------------------------------------------------------------------------------- Wound Assessment Details [name] Date of Service: 05/16/2021 12:45 PM Medical Record Number: [id number] Patient Account Number: [id number] Date of Birth/Sex: March 29, 1946 (75 y.o. F) Treating RN: Cornell Barman Primary Care Corliss Lamartina: Glenis Smoker Other Clinician: Referring Gwenlyn Hottinger: Referral, Self Treating Janzen Sacks/Extender: Yaakov Guthrie in Treatment: 0 Wound Status Wound Number: 4 Primary Trauma, Other Etiology: Wound Location: Left Knee Wound Status: Open Wounding Event: Trauma Comorbid Chronic Obstructive Pulmonary Disease (COPD), Date Acquired: 04/13/2021 History: Hypertension, Type II Diabetes Weeks Of Treatment: 0 Clustered Wound: Yes Photos Wound Measurements Length: (cm) 0.7 Width: (cm) 0.8 Depth: (cm) 1.6 Area: (cm) 0.44 Volume: (cm) 0.704 % Reduction in Area: 0% % Reduction in Volume: 0% Epithelialization: None Tunneling: No Undermining: Yes Starting Position  (o'clock): 1 Ending Position (o'clock): 1 Maximum Distance: (cm) 2.5 Wound Description Classification: Full Thickness Without Exposed Support Structu Exudate Amount: Large Exudate Type: Sanguinous Exudate Color: red res Foul Odor After Cleansing: No Slough/Fibrino No Wound Bed Granulation Amount: None Present (0%) Exposed Structure Necrotic Amount: Large (67-100%) Fascia Exposed: No Necrotic Quality: Eschar Fat Layer (Subcutaneous Tissue) Exposed: Yes Tendon Exposed: No Muscle Exposed: No Joint Exposed: No Bone Exposed: No Electronic Signature(s) Signed: 05/16/2021 4:39:55 PM By: Gretta Cool, BSN, RN, CWS, Kim RN, BSN Entered By: Gretta Cool, BSN, RN, CWS, Kim on 05/16/2021 13:29:14 CELE, MOTE ([id number]) -------------------------------------------------------------------------------- [place] Details [name] Date of Service: 05/16/2021 12:45 PM Medical Record Number: [id number] Patient Account Number: [id number] Date of Birth/Sex: 04-19-1946 (75 y.o. F) Treating RN: Cornell Barman Primary Care Erico Stan: Glenis Smoker Other Clinician: Referring Kolbi Tofte: Referral, Self Treating Guila Owensby/Extender: Yaakov Guthrie in Treatment: 0 Vital Signs Time Taken: 12:59 Temperature (F): 98.1 Height (in): 67 Pulse (bpm): 76 Weight (lbs): 163 Respiratory Rate (breaths/min): 16 Body Mass Index (BMI): 25.5 Blood Pressure (mmHg): 151/68 Reference Range: 80 - 120 mg / dl Electronic Signature(s) Signed: 05/16/2021 4:39:55 PM By: Gretta Cool, BSN, RN, CWS, Kim RN, BSN Entered By: Gretta Cool, BSN, RN, CWS, Kim on 05/16/2021 13:00:18

## 2021-05-16 NOTE — Progress Notes (Signed)
[name] ([id number]) Visit Report for 05/16/2021 Abuse/Suicide Risk Screen Details [name], [name] Date of Service: 05/16/2021 12:45 PM Medical Record Number: [id number] Patient Account Number: [id number] Date of Birth/Sex: 1945-08-02 (75 y.o. F) Treating RN: Huel Coventry Primary Care Jaydi Bray: Milon Score Other Clinician: Referring Dina Mobley: Referral, Self Treating Brynnly Bonet/Extender: Tilda Franco in Treatment: 0 Abuse/Suicide Risk Screen Items Answer ABUSE RISK SCREEN: Has anyone close to you tried to hurt or harm you recentlyo No Do you feel uncomfortable with anyone in your familyo No Has anyone forced you do things that you didnot want to doo No Electronic Signature(s) Signed: 05/16/2021 4:39:55 PM By: Elliot Gurney, BSN, RN, CWS, Kim RN, BSN Entered By: Elliot Gurney, BSN, RN, CWS, Kim on 05/16/2021 13:01:55 [name] ([id number]) -------------------------------------------------------------------------------- Activities of Daily Living Details [name] Date of Service: 05/16/2021 12:45 PM Medical Record Number: [id number] Patient Account Number: [id number] Date of Birth/Sex: 10/02/1945 (75 y.o. F) Treating RN: Huel Coventry Primary Care Masa Lubin: Milon Score Other Clinician: Referring Andreana Klingerman: Referral, Self Treating Samanthan Dugo/Extender: Tilda Franco in Treatment: 0 Activities of Daily Living Items Answer Activities of Daily Living (Please select one for each item) Drive Automobile Completely Able Take Medications Completely Able Use Telephone Completely Able Care for Appearance Completely Able Use Toilet Completely Able Bath / Shower Completely Able Dress Self Completely Able Feed Self Completely Able Walk Completely Able Get In / Out Bed Completely Able Housework Completely Able Prepare Meals Completely Able Handle Money Completely Able Shop for Self Completely Able Electronic Signature(s) Signed: 05/16/2021 4:39:55  PM By: Elliot Gurney, BSN, RN, CWS, Kim RN, BSN Entered By: Elliot Gurney, BSN, RN, CWS, Kim on 05/16/2021 13:02:17 RACHELLA, BASDEN ([id number]) -------------------------------------------------------------------------------- Education Screening Details [name] Date of Service: 05/16/2021 12:45 PM Medical Record Number: [id number] Patient Account Number: [id number] Date of Birth/Sex: 03-24-1946 (75 y.o. F) Treating RN: Huel Coventry Primary Care Lyrick Lagrand: Milon Score Other Clinician: Referring Alayna Mabe: Referral, Self Treating Ousman Dise/Extender: Tilda Franco in Treatment: 0 Primary Learner Assessed: Patient Learning Preferences/Education Level/Primary Language Learning Preference: Explanation, Demonstration Highest Education Level: High School Preferred Language: English Cognitive Barrier Language Barrier: No Translator Needed: No Memory Deficit: No Emotional Barrier: No Physical Barrier Impaired Vision: No Impaired Hearing: No Decreased Hand dexterity: No Knowledge/Comprehension Knowledge Level: High Comprehension Level: High Ability to understand written instructions: High Ability to understand verbal instructions: High Motivation Anxiety Level: Calm Cooperation: Cooperative Education Importance: Acknowledges Need Interest in Health Problems: Asks Questions Perception: Coherent Willingness to Engage in Self-Management High Activities: Readiness to Engage in Self-Management High Activities: Electronic Signature(s) Signed: 05/16/2021 4:39:55 PM By: Elliot Gurney, BSN, RN, CWS, Kim RN, BSN Entered By: Elliot Gurney, BSN, RN, CWS, Kim on 05/16/2021 13:02:44 PARIS, HOHN ([id number]) -------------------------------------------------------------------------------- Fall Risk Assessment Details [name] Date of Service: 05/16/2021 12:45 PM Medical Record Number: [id number] Patient Account Number: [id number] Date of Birth/Sex: Dec 18, 1945 (75 y.o.  F) Treating RN: Huel Coventry Primary Care Rhythm Wigfall: Milon Score Other Clinician: Referring Kentley Cedillo: Referral, Self Treating Ottis Vacha/Extender: Tilda Franco in Treatment: 0 Fall Risk Assessment Items Have you had 2 or more falls in the last 12 monthso 0 Yes Have you had any fall that resulted in injury in the last 12 monthso 0 Yes FALLS RISK SCREEN History of falling - immediate or within 3 months 25 Yes Secondary diagnosis (Do you have 2 or more medical diagnoseso) 0 No Ambulatory aid None/bed rest/wheelchair/nurse 0 Yes Crutches/cane/walker 0 No Furniture 0 No Intravenous therapy  Access/Saline/Heparin Lock 0 No Gait/Transferring Normal/ bed rest/ wheelchair 0 Yes Weak (short steps with or without shuffle, stooped but able to lift head while walking, may 0 No seek support from furniture) Impaired (short steps with shuffle, may have difficulty arising from chair, head down, impaired 0 No balance) Mental Status Oriented to own ability 0 Yes Electronic Signature(s) Signed: 05/16/2021 4:39:55 PM By: Elliot Gurney, BSN, RN, CWS, Kim RN, BSN Entered By: Elliot Gurney, BSN, RN, CWS, Kim on 05/16/2021 13:03:04 [name] ([id number]) -------------------------------------------------------------------------------- Foot Assessment Details [name] Date of Service: 05/16/2021 12:45 PM Medical Record Number: [id number] Patient Account Number: [id number] Date of Birth/Sex: September 21, 1945 (75 y.o. F) Treating RN: Huel Coventry Primary Care Mozelle Remlinger: Milon Score Other Clinician: Referring Arran Fessel: Referral, Self Treating Chrishelle Zito/Extender: Tilda Franco in Treatment: 0 Foot Assessment Items Site Locations + = Sensation present, - = Sensation absent, C = Callus, U = Ulcer R = Redness, W = Warmth, M = Maceration, PU = Pre-ulcerative lesion F = Fissure, S = Swelling, D = Dryness Assessment Right: Left: Other Deformity: No No Prior Foot Ulcer: No No Prior  Amputation: No No Charcot Joint: No No Ambulatory Status: Ambulatory Without Help Gait: Steady Electronic Signature(s) Signed: 05/16/2021 4:39:55 PM By: Elliot Gurney, BSN, RN, CWS, Kim RN, BSN Entered By: Elliot Gurney, BSN, RN, CWS, Kim on 05/16/2021 13:03:50 [name] ([id number]) -------------------------------------------------------------------------------- Nutrition Risk Screening Details [name] Date of Service: 05/16/2021 12:45 PM Medical Record Number: [id number] Patient Account Number: [id number] Date of Birth/Sex: 03-24-1946 (75 y.o. F) Treating RN: Huel Coventry Primary Care Javontay Vandam: Milon Score Other Clinician: Referring Dezarai Prew: Referral, Self Treating Carlin Attridge/Extender: Tilda Franco in Treatment: 0 Height (in): 67 Weight (lbs): 163 Body Mass Index (BMI): 25.5 Nutrition Risk Screening Items Score Screening NUTRITION RISK SCREEN: I have an illness or condition that made me change the kind and/or amount of food I eat 0 No I eat fewer than two meals per day 0 No I eat few fruits and vegetables, or milk products 0 No I have three or more drinks of beer, liquor or wine almost every day 0 No I have tooth or mouth problems that make it hard for me to eat 0 No I don't always have enough money to buy the food I need 0 No I eat alone most of the time 0 No I take three or more different prescribed or over-the-counter drugs a day 1 Yes Without wanting to, I have lost or gained 10 pounds in the last six months 0 No I am not always physically able to shop, cook and/or feed myself 0 No Nutrition Protocols Good Risk Protocol 0 No interventions needed Moderate Risk Protocol High Risk Proctocol Risk Level: Good Risk Score: 1 Electronic Signature(s) Signed: 05/16/2021 4:39:55 PM By: Elliot Gurney, BSN, RN, CWS, Kim RN, BSN Entered By: Elliot Gurney, BSN, RN, CWS, Kim on 05/16/2021 13:03:21

## 2021-05-21 ENCOUNTER — Other Ambulatory Visit: Payer: Self-pay | Admitting: Internal Medicine

## 2021-05-21 ENCOUNTER — Ambulatory Visit
Admission: RE | Admit: 2021-05-21 | Discharge: 2021-05-21 | Disposition: A | Discharge status: Home / Self Care | Payer: Medicare Other | Source: Ambulatory Visit | Attending: Internal Medicine | Admitting: Internal Medicine

## 2021-05-21 ENCOUNTER — Ambulatory Visit
Admission: RE | Admit: 2021-05-21 | Discharge: 2021-05-21 | Disposition: A | Discharge status: Home / Self Care | Payer: Medicare Other | Attending: Internal Medicine | Admitting: Internal Medicine

## 2021-05-21 DIAGNOSIS — T148XXA Other injury of unspecified body region, initial encounter: Secondary | ICD-10-CM | POA: Insufficient documentation

## 2021-05-23 ENCOUNTER — Encounter (HOSPITAL_BASED_OUTPATIENT_CLINIC_OR_DEPARTMENT_OTHER): Payer: Medicare Other | Admitting: Internal Medicine

## 2021-05-23 ENCOUNTER — Other Ambulatory Visit: Payer: Self-pay

## 2021-05-23 DIAGNOSIS — E11622 Type 2 diabetes mellitus with other skin ulcer: Secondary | ICD-10-CM

## 2021-05-23 DIAGNOSIS — L97822 Non-pressure chronic ulcer of other part of left lower leg with fat layer exposed: Secondary | ICD-10-CM

## 2021-05-24 NOTE — Progress Notes (Signed)
[name], [name] ([id number]) Visit Report for 05/23/2021 Arrival Information Details [name], [name] Date of Service: 05/23/2021 3:30 PM Medical Record Number: [id number] Patient Account Number: [id number] Date of Birth/Sex: 12-24-1945 (75 y.o. F) Treating RN: Cornell Barman Primary Care Yancy Hascall: [facility], [place] Other Clinician: Referring Judye Lorino: Referral, Self Treating Marcelene Weidemann/Extender: Yaakov Guthrie in Treatment: 1 Visit Information History Since Last Visit Has Dressing in Place as Prescribed: Yes Patient Arrived: Ambulatory Pain Present Now: No Arrival Time: 15:58 Accompanied By: self Transfer Assistance: None Patient Identification Verified: Yes Secondary Verification Process Completed: Yes Electronic Signature(s) Signed: 05/23/2021 5:42:47 PM By: Gretta Cool, BSN, RN, CWS, Kim RN, BSN Entered By: Gretta Cool, BSN, RN, CWS, Kim on 05/23/2021 15:58:43 [name] ([id number]) -------------------------------------------------------------------------------- Clinic Level of Care Assessment Details [name] Date of Service: 05/23/2021 3:30 PM Medical Record Number: [id number] Patient Account Number: [id number] Date of Birth/Sex: [birth year] (75 y.o. F) Treating RN: Donnamarie Poag Primary Care Racheal Mathurin: [facility] Other Clinician: Referring Cannon Quinton: Referral, Self Treating Marquavius Scaife/Extender: Yaakov Guthrie in Treatment: 1 Clinic Level of Care Assessment Items TOOL 4 Quantity Score []  - Use when only an EandM is performed on FOLLOW-UP visit 0 ASSESSMENTS - Nursing Assessment / Reassessment []  - Reassessment of Co-morbidities (includes updates in patient status) 0 []  - 0 Reassessment of Adherence to Treatment Plan ASSESSMENTS - Wound and Skin Assessment / Reassessment X - Simple Wound Assessment / Reassessment - one wound 1 5 []  - 0 Complex Wound Assessment / Reassessment - multiple wounds []  - 0 Dermatologic / Skin  Assessment (not related to wound area) ASSESSMENTS - Focused Assessment []  - Circumferential Edema Measurements - multi extremities 0 []  - 0 Nutritional Assessment / Counseling / Intervention []  - 0 Lower Extremity Assessment (monofilament, tuning fork, pulses) []  - 0 Peripheral Arterial Disease Assessment (using hand held doppler) ASSESSMENTS - Ostomy and/or Continence Assessment and Care []  - Incontinence Assessment and Management 0 []  - 0 Ostomy Care Assessment and Management (repouching, etc.) PROCESS - Coordination of Care X - Simple Patient / Family Education for ongoing care 1 15 []  - 0 Complex (extensive) Patient / Family Education for ongoing care []  - 0 Staff obtains Programmer, systems, Records, Test Results / Process Orders []  - 0 Staff telephones HHA, Nursing Homes / Clarify orders / etc []  - 0 Routine Transfer to another Facility (non-emergent condition) []  - 0 Routine Hospital Admission (non-emergent condition) []  - 0 New Admissions / Biomedical engineer / Ordering NPWT, Apligraf, etc. []  - 0 Emergency Hospital Admission (emergent condition) X- 1 10 Simple Discharge Coordination []  - 0 Complex (extensive) Discharge Coordination PROCESS - Special Needs []  - Pediatric / Minor Patient Management 0 []  - 0 Isolation Patient Management []  - 0 Hearing / Language / Visual special needs []  - 0 Assessment of Community assistance (transportation, D/C planning, etc.) []  - 0 Additional assistance / Altered mentation []  - 0 Support Surface(s) Assessment (bed, cushion, seat, etc.) INTERVENTIONS - Wound Cleansing / Measurement Witherell, Lene C. ([id number]) X- 1 5 Simple Wound Cleansing - one wound []  - 0 Complex Wound Cleansing - multiple wounds X- 1 5 Wound Imaging (photographs - any number of wounds) []  - 0 Wound Tracing (instead of photographs) X- 1 5 Simple Wound Measurement - one wound []  - 0 Complex Wound Measurement - multiple wounds INTERVENTIONS - Wound  Dressings X - Small Wound Dressing one or multiple wounds 1 10 []  - 0 Medium Wound Dressing one or multiple wounds []  -  0 Large Wound Dressing one or multiple wounds X- 1 5 Application of Medications - topical []  - 0 Application of Medications - injection INTERVENTIONS - Miscellaneous []  - External ear exam 0 []  - 0 Specimen Collection (cultures, biopsies, blood, body fluids, etc.) []  - 0 Specimen(s) / Culture(s) sent or taken to Lab for analysis []  - 0 Patient Transfer (multiple staff / Civil Service fast streamer / Similar devices) []  - 0 Simple Staple / Suture removal (25 or less) []  - 0 Complex Staple / Suture removal (26 or more) []  - 0 Hypo / Hyperglycemic Management (close monitor of Blood Glucose) []  - 0 Ankle / Brachial Index (ABI) - do not check if billed separately X- 1 5 Vital Signs Has the patient been seen at the hospital within the last three years: Yes Total Score: 65 Level Of Care: New/Established - Level 2 Electronic Signature(s) Signed: 05/23/2021 4:51:20 PM By: Donnamarie Poag Entered By: Donnamarie Poag on 05/23/2021 16:38:53 [name] ([id number]) -------------------------------------------------------------------------------- Encounter Discharge Information Details [name] Date of Service: 05/23/2021 3:30 PM Medical Record Number: [id number] Patient Account Number: [id number] Date of Birth/Sex: [birth year] (75 y.o. F) Treating RN: Donnamarie Poag Primary Care Json Koelzer: [facility] Other Clinician: Referring Leovardo Thoman: Referral, Self Treating Annita Ratliff/Extender: Yaakov Guthrie in Treatment: 1 Encounter Discharge Information Items Discharge Condition: Stable Ambulatory Status: Ambulatory Discharge Destination: Home Transportation: Private Auto Accompanied By: self Schedule Follow-up Appointment: Yes Clinical Summary of Care: Electronic Signature(s) Signed: 05/23/2021 4:51:20 PM By: Donnamarie Poag Entered By: Donnamarie Poag on 05/23/2021  16:39:57 [name] ([id number]) -------------------------------------------------------------------------------- Lower Extremity Assessment Details [name] Date of Service: 05/23/2021 3:30 PM Medical Record Number: [id number] Patient Account Number: [id number] Date of Birth/Sex: August 20, 1945 (75 y.o. F) Treating RN: Cornell Barman Primary Care Shallon Yaklin: [facility], [place] Other Clinician: Referring Aiza Vollrath: Referral, Self Treating Alithea Lapage/Extender: Yaakov Guthrie in Treatment: 1 Vascular Assessment Pulses: Dorsalis Pedis Palpable: [Left:Yes] Electronic Signature(s) Signed: 05/23/2021 5:42:47 PM By: Gretta Cool, BSN, RN, CWS, Kim RN, BSN Entered By: Gretta Cool, BSN, RN, CWS, Kim on 05/23/2021 [name], Kingsland C. ([id number]) -------------------------------------------------------------------------------- Multi Wound Chart Details [name] Date of Service: 05/23/2021 3:30 PM Medical Record Number: [id number] Patient Account Number: [id number] Date of Birth/Sex: April 01, 1946 (75 y.o. F) Treating RN: Donnamarie Poag Primary Care Marianna Cid: [facility] Other Clinician: Referring Ashira Kirsten: Referral, Self Treating Yehoshua Vitelli/Extender: Yaakov Guthrie in Treatment: 1 Vital Signs Height(in): 67 Pulse(bpm): 75 Weight(lbs): 163 Blood Pressure(mmHg): 141/66 Body Mass Index(BMI): 26 Temperature(F): 98.5 Respiratory Rate(breaths/min): 16 Photos: [N/A:N/A] Wound Location: Left Knee N/A N/A Wounding Event: Hematoma N/A N/A Primary Etiology: Trauma, Other N/A N/A Comorbid History: Chronic Obstructive Pulmonary N/A N/A Disease (COPD), Hypertension, Type II Diabetes Date Acquired: 04/13/2021 N/A N/A Weeks of Treatment: 1 N/A N/A Wound Status: Open N/A N/A Clustered Wound: Yes N/A N/A Measurements L x W x D (cm) 0.5x0.6x0.7 N/A N/A Area (cm) : 0.236 N/A N/A Volume (cm) : 0.165 N/A N/A % Reduction in Area: 46.40% N/A N/A %  Reduction in Volume: 76.60% N/A N/A Starting Position 1 (o'clock): 12 Ending Position 1 (o'clock): 12 Maximum Distance 1 (cm): 3.5 Undermining: Yes N/A N/A Classification: Full Thickness Without Exposed N/A N/A Support Structures Exudate Amount: Large N/A N/A Exudate Type: Serosanguineous N/A N/A Exudate Color: red, brown N/A N/A Granulation Amount: Large (67-100%) N/A N/A Granulation Quality: Red N/A N/A Necrotic Amount: None Present (0%) N/A N/A Exposed Structures: Fat Layer (Subcutaneous Tissue): N/A N/A Yes Fascia: No Tendon:  No Muscle: No Joint: No Bone: No Epithelialization: None N/A N/A Treatment Notes Wound #4 (Knee) Wound Laterality: Left Cleanser Swing, Gisell C. ([id number]) Normal Saline Discharge Instruction: Wash your hands with soap and water. Remove old dressing, discard into plastic bag and place into trash. Cleanse the wound with Normal Saline prior to applying a clean dressing using gauze sponges, not tissues or cotton balls. Do not scrub or use excessive force. Pat dry using gauze sponges, not tissue or cotton balls. Peri-Wound Care Topical Primary Dressing Iodoform 1/4x 5 (in/yd) Discharge Instruction: lightly pack and undermining Apply Iodoform Packing Strip as instructed. Secondary Dressing Gauze Telfa Adhesive Island Dressing, 4x4 (in/in) Discharge Instruction: Apply over dressing to secure in place. Secured With Compression Wrap Compression Stockings Add-Ons Electronic Signature(s) Signed: 05/23/2021 4:44:41 PM By: Kalman Shan DO Entered By: Kalman Shan on 05/23/2021 16:40:17 [name], [name] ([id number]) -------------------------------------------------------------------------------- Multi-Disciplinary Care Plan Details [name] Date of Service: 05/23/2021 3:30 PM Medical Record Number: [id number] Patient Account Number: [id number] Date of Birth/Sex: May 02, 1946 (75 y.o. F) Treating RN: Donnamarie Poag Primary Care  Ameet Sandy: [facility] Other Clinician: Referring Dorri Ozturk: Referral, Self Treating Arlis Everly/Extender: Yaakov Guthrie in Treatment: 1 Active Inactive Necrotic Tissue Nursing Diagnoses: Impaired tissue integrity related to necrotic/devitalized tissue Knowledge deficit related to management of necrotic/devitalized tissue Goals: Necrotic/devitalized tissue will be minimized in the wound bed Date Initiated: 05/16/2021 Target Resolution Date: 05/16/2021 Goal Status: Active Patient/caregiver will verbalize understanding of reason and process for debridement of necrotic tissue Date Initiated: 05/16/2021 Date Inactivated: 05/23/2021 Target Resolution Date: 05/16/2021 Goal Status: Met Interventions: Assess patient pain level pre-, during and post procedure and prior to discharge Provide education on necrotic tissue and debridement process Treatment Activities: Excisional debridement : 05/16/2021 Notes: Pain, Acute or Chronic Nursing Diagnoses: Potential alteration in comfort, pain Goals: Patient will verbalize adequate pain control and receive pain control interventions during procedures as needed Date Initiated: 05/16/2021 Target Resolution Date: 05/16/2021 Goal Status: Active Patient/caregiver will verbalize adequate pain control between visits Date Initiated: 05/16/2021 Target Resolution Date: 05/17/2021 Goal Status: Active Patient/caregiver will verbalize comfort level met Date Initiated: 05/16/2021 Target Resolution Date: 05/16/2021 Goal Status: Active Interventions: Assess comfort goal upon admission Provide education on pain management Treatment Activities: Administer pain control measures as ordered : 05/16/2021 Refer to pain specialist or other pain support program : 05/16/2021 Notes: Wound/Skin Impairment Nursing Diagnoses: Impaired tissue integrity Knowledge deficit related to smoking impact on wound healing [name], [name] ([id number]) Knowledge deficit  related to ulceration/compromised skin integrity Goals: Patient will demonstrate a reduced rate of smoking or cessation of smoking Date Initiated: 05/16/2021 Target Resolution Date: 05/16/2021 Goal Status: Active Ulcer/skin breakdown will have a volume reduction of 30% by week 4 Date Initiated: 05/16/2021 Target Resolution Date: 06/15/2021 Goal Status: Active Interventions: Assess ulceration(s) every visit Treatment Activities: Skin care regimen initiated : 05/16/2021 Topical wound management initiated : 05/16/2021 Notes: Electronic Signature(s) Signed: 05/23/2021 4:51:20 PM By: Donnamarie Poag Entered By: Donnamarie Poag on 05/23/2021 16:22:23 [name] ([id number]) -------------------------------------------------------------------------------- Pain Assessment Details [name] Date of Service: 05/23/2021 3:30 PM Medical Record Number: [id number] Patient Account Number: [id number] Date of Birth/Sex: 1946/05/19 (75 y.o. F) Treating RN: Cornell Barman Primary Care Maysin Carstens: [facility], [place] Other Clinician: Referring Saifullah Jolley: Referral, Self Treating Trenity Pha/Extender: Yaakov Guthrie in Treatment: 1 Active Problems Location of Pain Severity and Description of Pain Patient Has Paino No Site Locations Pain Management and Medication Current Pain Management: Notes Patient denies pain  at this time. Electronic Signature(s) Signed: 05/23/2021 5:42:47 PM By: Gretta Cool, BSN, RN, CWS, Kim RN, BSN Entered By: Gretta Cool, BSN, RN, CWS, Kim on 05/23/2021 16:00:09 [name], [name] ([id number]) -------------------------------------------------------------------------------- Patient/Caregiver Education Details [name] Date of Service: 05/23/2021 3:30 PM Medical Record Number: [id number] Patient Account Number: [id number] Date of Birth/Gender: [birth year] (75 y.o. F) Treating RN: Donnamarie Poag Primary Care Physician: [facility] Other  Clinician: Referring Physician: Referral, Self Treating Physician/Extender: Yaakov Guthrie in Treatment: 1 Education Assessment Education Provided To: Patient Education Topics Provided Basic Hygiene: Pain: Wound Debridement: Wound/Skin Impairment: Electronic Signature(s) Signed: 05/23/2021 4:51:20 PM By: Donnamarie Poag Entered By: Donnamarie Poag on 05/23/2021 16:39:22 [name] ([id number]) -------------------------------------------------------------------------------- Wound Assessment Details [name] Date of Service: 05/23/2021 3:30 PM Medical Record Number: [id number] Patient Account Number: [id number] Date of Birth/Sex: 1946/06/02 (75 y.o. F) Treating RN: Cornell Barman Primary Care Shantil Vallejo: [facility], [place] Other Clinician: Referring Ranette Luckadoo: Referral, Self Treating Hend Mccarrell/Extender: Yaakov Guthrie in Treatment: 1 Wound Status Wound Number: 4 Primary Trauma, Other Etiology: Wound Location: Left Knee Wound Status: Open Wounding Event: Hematoma Comorbid Chronic Obstructive Pulmonary Disease (COPD), Date Acquired: 04/13/2021 History: Hypertension, Type II Diabetes Weeks Of Treatment: 1 Clustered Wound: Yes Photos Wound Measurements Length: (cm) 0.5 Width: (cm) 0.6 Depth: (cm) 0.7 Area: (cm) 0.236 Volume: (cm) 0.165 % Reduction in Area: 46.4% % Reduction in Volume: 76.6% Epithelialization: None Tunneling: No Undermining: Yes Starting Position (o'clock): 12 Ending Position (o'clock): 12 Maximum Distance: (cm) 3.5 Wound Description Classification: Full Thickness Without Exposed Support Structu Exudate Amount: Large Exudate Type: Serosanguineous Exudate Color: red, brown res Foul Odor After Cleansing: No Slough/Fibrino No Wound Bed Granulation Amount: Large (67-100%) Exposed Structure Granulation Quality: Red Fascia Exposed: No Necrotic Amount: None Present (0%) Fat Layer (Subcutaneous Tissue) Exposed: Yes Tendon  Exposed: No Muscle Exposed: No Joint Exposed: No Bone Exposed: No Treatment Notes Wound #4 (Knee) Wound Laterality: Left Cleanser Normal Saline Code, Izel C. ([id number]) Discharge Instruction: Wash your hands with soap and water. Remove old dressing, discard into plastic bag and place into trash. Cleanse the wound with Normal Saline prior to applying a clean dressing using gauze sponges, not tissues or cotton balls. Do not scrub or use excessive force. Pat dry using gauze sponges, not tissue or cotton balls. Peri-Wound Care Topical Primary Dressing Iodoform 1/4x 5 (in/yd) Discharge Instruction: lightly pack and undermining Apply Iodoform Packing Strip as instructed. Secondary Dressing Gauze Telfa Adhesive [place] Dressing, 4x4 (in/in) Discharge Instruction: Apply over dressing to secure in place. Secured With Compression Wrap Compression Stockings Environmental education officer) Signed: 05/23/2021 4:51:20 PM By: Donnamarie Poag Signed: 05/23/2021 5:42:47 PM By: Gretta Cool, BSN, RN, CWS, Kim RN, BSN Entered By: Donnamarie Poag on 05/23/2021 16:25:07 [name], [name] ([id number]) -------------------------------------------------------------------------------- Vitals Details [name] Date of Service: 05/23/2021 3:30 PM Medical Record Number: [id number] Patient Account Number: [id number] Date of Birth/Sex: 1946-04-21 (75 y.o. F) Treating RN: Cornell Barman Primary Care Eugenio Dollins: [facility], [place] Other Clinician: Referring Lakitha Gordy: Referral, Self Treating Mylisa Brunson/Extender: Yaakov Guthrie in Treatment: 1 Vital Signs Time Taken: 15:59 Temperature (F): 98.5 Height (in): 67 Pulse (bpm): 75 Weight (lbs): 163 Respiratory Rate (breaths/min): 16 Body Mass Index (BMI): 25.5 Blood Pressure (mmHg): 141/66 Reference Range: 80 - 120 mg / dl Electronic Signature(s) Signed: 05/23/2021 5:42:47 PM By: Gretta Cool, BSN, RN, CWS, Kim RN, BSN Entered By: Gretta Cool, BSN, RN, CWS, Kim  on 05/23/2021 15:59:55

## 2021-05-24 NOTE — Progress Notes (Signed)
HAPPY, BALIAN ([id number]) Visit Report for 05/23/2021 Chief Complaint Document Details [name], [name] Date of Service: 05/23/2021 3:30 PM Medical Record Number: [id number] Patient Account Number: [id number] Date of Birth/Sex: Sep 08, 1945 (75 y.o. F) Treating RN: Donnamarie Poag Primary Care Provider: [facility] Other Clinician: Referring Provider: Referral, Self Treating Provider/Extender: Yaakov Guthrie in Treatment: 1 Information Obtained from: Patient Chief Complaint Patient presents for a left knee wound status post fall Electronic Signature(s) Signed: 05/23/2021 4:44:41 PM By: Kalman Shan DO Entered By: Kalman Shan on 05/23/2021 16:40:37 [name] ([id number]) -------------------------------------------------------------------------------- HPI Details [name] Date of Service: 05/23/2021 3:30 PM Medical Record Number: [id number] Patient Account Number: [id number] Date of Birth/Sex: 12-Sep-1945 (75 y.o. F) Treating RN: Donnamarie Poag Primary Care Provider: [facility] Other Clinician: Referring Provider: Referral, Self Treating Provider/Extender: Yaakov Guthrie in Treatment: 1 History of Present Illness Location: right lower extremity in the area of the lateral calf below the knee Quality: She is having no pain Context: The wound occurred when the patient a blunt injury and fall injury Modifying Factors: None as patient was healed Associated Signs and Symptoms: Patient reports having increase discharge. HPI Description: Admission 05/16/2021 [name] is a 75 year old female with a past medical history of type 2 diabetes controlled on metformin and essential hypertension that presents to the clinic for a right knee wound. She states that 6 weeks ago she fell tripping over her ottoman onto her left knee. She states that she put ice on the area and has not used any other treatment modalities. She  presents today with scabs on her knee. She states she actually did not want to follow-up but her son recommended it. She has not seen any other healthcare provider for this issue. She has not been on antibiotics for this. She currently denies pain or signs of infection. 11/9; patient presents for 1 week follow-up. She has been using iodoform packing to the wound bed without issues. She denies signs of infection. She has no issues or complaints today. Electronic Signature(s) Signed: 05/23/2021 4:44:41 PM By: Kalman Shan DO Entered By: Kalman Shan on 05/23/2021 16:41:06 [name] ([id number]) -------------------------------------------------------------------------------- Physical Exam Details [name] Date of Service: 05/23/2021 3:30 PM Medical Record Number: [id number] Patient Account Number: [id number] Date of Birth/Sex: Mar 05, 1946 (75 y.o. F) Treating RN: Donnamarie Poag Primary Care Provider: [facility] Other Clinician: Referring Provider: Referral, Self Treating Provider/Extender: Yaakov Guthrie in Treatment: 1 Constitutional . Cardiovascular . Psychiatric . Notes Left knee with open wound and circumferential undermining to largest depth of 3.5 cm at the 5 o'clock position. No signs of infection on exam. Electronic Signature(s) Signed: 05/23/2021 4:44:41 PM By: Kalman Shan DO Entered By: Kalman Shan on 05/23/2021 16:42:24 ARMAHNI, VINCENTI ([id number]) -------------------------------------------------------------------------------- Physician Orders Details [name] Date of Service: 05/23/2021 3:30 PM Medical Record Number: [id number] Patient Account Number: [id number] Date of Birth/Sex: [birth year] (75 y.o. F) Treating RN: Donnamarie Poag Primary Care Provider: [facility] Other Clinician: Referring Provider: Referral, Self Treating Provider/Extender: Yaakov Guthrie in Treatment:  1 Verbal / Phone Orders: No Diagnosis Coding Follow-up Appointments o Return Appointment in 1 week. o Nurse Visit as needed Bathing/ Shower/ Hygiene o Clean wound with Normal Saline or wound cleanser. o No tub bath. Additional Orders / Instructions o Follow Nutritious Diet and Increase Protein Intake Wound Treatment Wound #4 - Knee Wound Laterality: Left Cleanser: Normal Saline 1 x  Per Day/15 Days Discharge Instructions: Wash your hands with soap and water. Remove old dressing, discard into plastic bag and place into trash. Cleanse the wound with Normal Saline prior to applying a clean dressing using gauze sponges, not tissues or cotton balls. Do not scrub or use excessive force. Pat dry using gauze sponges, not tissue or cotton balls. Primary Dressing: Iodoform 1/4x 5 (in/yd) 1 x Per Day/15 Days Discharge Instructions: lightly pack and undermining Apply Iodoform Packing Strip as instructed. Secondary Dressing: Gauze 1 x Per Day/15 Days Secondary Dressing: [place] Dressing, 4x4 (in/in) (Generic) 1 x Per Day/15 Days Discharge Instructions: Apply over dressing to secure in place. Electronic Signature(s) Signed: 05/23/2021 4:44:41 PM By: Kalman Shan DO Signed: 05/23/2021 4:51:20 PM By: Donnamarie Poag Entered By: Donnamarie Poag on 05/23/2021 16:30:21 [name] ([id number]) -------------------------------------------------------------------------------- Problem List Details [name] Date of Service: 05/23/2021 3:30 PM Medical Record Number: [id number] Patient Account Number: [id number] Date of Birth/Sex: Jun 11, 1946 (75 y.o. F) Treating RN: Donnamarie Poag Primary Care Provider: [facility] Other Clinician: Referring Provider: Referral, Self Treating Provider/Extender: Yaakov Guthrie in Treatment: 1 Active Problems ICD-10 Encounter Code Description Active Date MDM Diagnosis L97.822 Non-pressure chronic ulcer of other part  of left lower leg with fat layer 05/16/2021 No Yes exposed E11.622 Type 2 diabetes mellitus with other skin ulcer 05/16/2021 No Yes Inactive Problems Resolved Problems Electronic Signature(s) Signed: 05/23/2021 4:44:41 PM By: Kalman Shan DO Entered By: Kalman Shan on 05/23/2021 16:40:09 [name] ([id number]) -------------------------------------------------------------------------------- Progress Note Details [name] Date of Service: 05/23/2021 3:30 PM Medical Record Number: [id number] Patient Account Number: [id number] Date of Birth/Sex: 1945/09/29 (75 y.o. F) Treating RN: Donnamarie Poag Primary Care Provider: [facility] Other Clinician: Referring Provider: Referral, Self Treating Provider/Extender: Yaakov Guthrie in Treatment: 1 Subjective Chief Complaint Information obtained from Patient Patient presents for a left knee wound status post fall History of Present Illness (HPI) The following HPI elements were documented for the patient's wound: Location: right lower extremity in the area of the lateral calf below the knee Quality: She is having no pain Context: The wound occurred when the patient a blunt injury and fall injury Modifying Factors: None as patient was healed Associated Signs and Symptoms: Patient reports having increase discharge. Admission 05/16/2021 [name] is a 75 year old female with a past medical history of type 2 diabetes controlled on metformin and essential hypertension that presents to the clinic for a right knee wound. She states that 6 weeks ago she fell tripping over her ottoman onto her left knee. She states that she put ice on the area and has not used any other treatment modalities. She presents today with scabs on her knee. She states she actually did not want to follow-up but her son recommended it. She has not seen any other healthcare provider for this issue. She has not been on  antibiotics for this. She currently denies pain or signs of infection. 11/9; patient presents for 1 week follow-up. She has been using iodoform packing to the wound bed without issues. She denies signs of infection. She has no issues or complaints today. Patient History Information obtained from Patient. Social History Former smoker, Marital Status - Married, Alcohol Use - Never, Drug Use - No History, Caffeine Use - Daily. Medical History Eyes Denies history of Cataracts, Glaucoma, Optic Neuritis Ear/Nose/Mouth/Throat Denies history of Chronic sinus problems/congestion, Middle ear problems Hematologic/Lymphatic Denies history of Anemia, Hemophilia, Human Immunodeficiency  Virus, Lymphedema, Sickle Cell Disease Respiratory Patient has history of Chronic Obstructive Pulmonary Disease (COPD) Denies history of Aspiration, Asthma, Pneumothorax, Sleep Apnea, Tuberculosis Cardiovascular Patient has history of Hypertension Denies history of Angina, Arrhythmia, Congestive Heart Failure, Coronary Artery Disease, Deep Vein Thrombosis, Hypotension, Myocardial Infarction, Peripheral Arterial Disease, Peripheral Venous Disease, Phlebitis, Vasculitis Gastrointestinal Denies history of Cirrhosis , Colitis, Crohn s, Hepatitis A, Hepatitis B, Hepatitis C Endocrine Patient has history of Type II Diabetes Denies history of Type I Diabetes Immunological Denies history of Lupus Erythematosus, Raynaud s, Scleroderma Integumentary (Skin) Denies history of History of Burn, History of pressure wounds Musculoskeletal Denies history of Gout, Rheumatoid Arthritis, Osteoarthritis, Osteomyelitis Neurologic Denies history of Dementia, Neuropathy, Quadriplegia, Paraplegia, Seizure Disorder Oncologic Denies history of Received Chemotherapy, Received Radiation Medical And Surgical History Notes Cardiovascular hx SVT - followed by Dr Margorie John, [name] ([id number]) Objective Constitutional Vitals Time  Taken: 3:59 PM, Height: 67 in, Weight: 163 lbs, BMI: 25.5, Temperature: 98.5 F, Pulse: 75 bpm, Respiratory Rate: 16 breaths/min, Blood Pressure: 141/66 mmHg. General Notes: Left knee with open wound and circumferential undermining to largest depth of 3.5 cm at the 5 o'clock position. No signs of infection on exam. Integumentary (Hair, Skin) Wound #4 status is Open. Original cause of wound was Hematoma. The date acquired was: 04/13/2021. The wound has been in treatment 1 weeks. The wound is located on the Left Knee. The wound measures 0.5cm length x 0.6cm width x 0.7cm depth; 0.236cm^2 area and 0.165cm^3 volume. There is Fat Layer (Subcutaneous Tissue) exposed. There is no tunneling noted, however, there is undermining starting at 12:00 and ending at 12:00 with a maximum distance of 3.5cm. There is a large amount of serosanguineous drainage noted. There is large (67- 100%) red granulation within the wound bed. There is no necrotic tissue within the wound bed. Assessment Active Problems ICD-10 Non-pressure chronic ulcer of other part of left lower leg with fat layer exposed Type 2 diabetes mellitus with other skin ulcer Patient's wound has shown improvement in size since last clinic visit. I recommended continuing iodoform packing daily. No signs of infection on exam. Patient obtained her left knee x-ray. There was no osseous abnormality. Follow-up in 1 week. Plan Follow-up Appointments: Return Appointment in 1 week. Nurse Visit as needed Bathing/ Shower/ Hygiene: Clean wound with Normal Saline or wound cleanser. No tub bath. Additional Orders / Instructions: Follow Nutritious Diet and Increase Protein Intake WOUND #4: - Knee Wound Laterality: Left Cleanser: Normal Saline 1 x Per Day/15 Days Discharge Instructions: Wash your hands with soap and water. Remove old dressing, discard into plastic bag and place into trash. Cleanse the wound with Normal Saline prior to applying a clean  dressing using gauze sponges, not tissues or cotton balls. Do not scrub or use excessive force. Pat dry using gauze sponges, not tissue or cotton balls. Primary Dressing: Iodoform 1/4x 5 (in/yd) 1 x Per Day/15 Days Discharge Instructions: lightly pack and undermining Apply Iodoform Packing Strip as instructed. Secondary Dressing: Gauze 1 x Per Day/15 Days Secondary Dressing: [place] Dressing, 4x4 (in/in) (Generic) 1 x Per Day/15 Days Discharge Instructions: Apply over dressing to secure in place. 1. Continue iodoform packing 2. Follow-up in 1 week [name], [name] ([id number]) Electronic Signature(s) Signed: 05/23/2021 4:44:41 PM By: Geralyn Corwin DO Entered By: Geralyn Corwin on 05/23/2021 16:44:01 [name] ([id number]) -------------------------------------------------------------------------------- ROS/PFSH Details [name] Date of Service: 05/23/2021 3:30 PM Medical Record Number: [id number] Patient Account Number: [id number] Date  of Birth/Sex: June 25, 1946 (75 y.o. F) Treating RN: Donnamarie Poag Primary Care Provider: [facility] Other Clinician: Referring Provider: Referral, Self Treating Provider/Extender: Yaakov Guthrie in Treatment: 1 Information Obtained From Patient Eyes Medical History: Negative for: Cataracts; Glaucoma; Optic Neuritis Ear/Nose/Mouth/Throat Medical History: Negative for: Chronic sinus problems/congestion; Middle ear problems Hematologic/Lymphatic Medical History: Negative for: Anemia; Hemophilia; Human Immunodeficiency Virus; Lymphedema; Sickle Cell Disease Respiratory Medical History: Positive for: Chronic Obstructive Pulmonary Disease (COPD) Negative for: Aspiration; Asthma; Pneumothorax; Sleep Apnea; Tuberculosis Cardiovascular Medical History: Positive for: Hypertension Negative for: Angina; Arrhythmia; Congestive Heart Failure; Coronary Artery Disease; Deep Vein Thrombosis; Hypotension;  Myocardial Infarction; Peripheral Arterial Disease; Peripheral Venous Disease; Phlebitis; Vasculitis Past Medical History Notes: hx SVT - followed by Dr Ubaldo Glassing Gastrointestinal Medical History: Negative for: Cirrhosis ; Colitis; Crohnos; Hepatitis A; Hepatitis B; Hepatitis C Endocrine Medical History: Positive for: Type II Diabetes Negative for: Type I Diabetes Time with diabetes: 10 years Treated with: Oral agents Immunological Medical History: Negative for: Lupus Erythematosus; Raynaudos; Scleroderma Integumentary (Skin) Medical History: Negative for: History of Burn; History of pressure wounds Musculoskeletal [name], [name] ([id number]) Medical History: Negative for: Gout; Rheumatoid Arthritis; Osteoarthritis; Osteomyelitis Neurologic Medical History: Negative for: Dementia; Neuropathy; Quadriplegia; Paraplegia; Seizure Disorder Oncologic Medical History: Negative for: Received Chemotherapy; Received Radiation Immunizations Pneumococcal Vaccine: Received Pneumococcal Vaccination: Yes Received Pneumococcal Vaccination On or After 60th Birthday: Yes Immunization Notes: up to date Implantable Devices No devices added Family and Social History Former smoker; Marital Status - Married; Alcohol Use: Never; Drug Use: No History; Caffeine Use: Daily; Financial Concerns: No; Food, Clothing or Shelter Needs: No; Support System Lacking: No; Transportation Concerns: No Electronic Signature(s) Signed: 05/23/2021 4:44:41 PM By: Kalman Shan DO Signed: 05/23/2021 4:51:20 PM By: Donnamarie Poag Entered By: Kalman Shan on 05/23/2021 16:41:18 [name] ([id number]) -------------------------------------------------------------------------------- [place] Details [name] Date of Service: 05/23/2021 Medical Record Number: [id number] Patient Account Number: [id number] Date of Birth/Sex: 1946/04/11 (75 y.o. F) Treating RN: Donnamarie Poag Primary Care Provider:  [facility] Other Clinician: Referring Provider: Referral, Self Treating Provider/Extender: Yaakov Guthrie in Treatment: 1 Diagnosis Coding ICD-10 Codes Code Description [phone] Non-pressure chronic ulcer of other part of left lower leg with fat layer exposed E11.622 Type 2 diabetes mellitus with other skin ulcer Facility Procedures CPT4 Code: [id number] Description: [id number] - WOUND CARE VISIT-LEV 2 EST PT Modifier: Quantity: 1 Physician Procedures CPT4 Code: [id number] Description: [id number] - WC PHYS LEVEL 3 - EST PT Modifier: Quantity: 1 CPT4 Code: Description: ICD-10 Diagnosis Description L97.822 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

## 2021-05-30 ENCOUNTER — Other Ambulatory Visit: Payer: Self-pay

## 2021-05-30 ENCOUNTER — Encounter (HOSPITAL_BASED_OUTPATIENT_CLINIC_OR_DEPARTMENT_OTHER): Payer: Medicare Other | Admitting: Internal Medicine

## 2021-05-30 DIAGNOSIS — L97822 Non-pressure chronic ulcer of other part of left lower leg with fat layer exposed: Secondary | ICD-10-CM

## 2021-05-30 DIAGNOSIS — E11622 Type 2 diabetes mellitus with other skin ulcer: Secondary | ICD-10-CM | POA: Diagnosis not present

## 2021-05-30 NOTE — Progress Notes (Signed)
OLETHA, TOLSON ([id number]) Visit Report for 05/30/2021 Arrival Information Details [name], [name] Date of Service: 05/30/2021 10:45 AM Medical Record Number: [id number] Patient Account Number: [id number] Date of Birth/Sex: October 20, 1945 (75 y.o. F) Treating RN: Donnamarie Poag Primary Care Danette Weinfeld: [facility] Other Clinician: Referring Andrienne Havener: [facility] Treating Latesa Fratto/Extender: Yaakov Guthrie in Treatment: 2 Visit Information History Since Last Visit Added or deleted any medications: No Patient Arrived: Ambulatory Had a fall or experienced change in No Arrival Time: 10:47 activities of daily living that may affect Accompanied By: self risk of falls: Transfer Assistance: None Hospitalized since last visit: No Patient Identification Verified: Yes Has Dressing in Place as Prescribed: Yes Secondary Verification Process Completed: Yes Pain Present Now: No Electronic Signature(s) Signed: 05/30/2021 2:09:05 PM By: Donnamarie Poag Previous Signature: 05/30/2021 12:00:28 PM Version By: Donnamarie Poag Entered By: Donnamarie Poag on 05/30/2021 14:09:05 [name] ([id number]) -------------------------------------------------------------------------------- Clinic Level of Care Assessment Details Patient Name: [name] Date of Service: 05/30/2021 10:45 AM Medical Record Number: [id number] Patient Account Number: [id number] Date of Birth/Sex: 10-Jul-1946 (75 y.o. F) Treating RN: Donnamarie Poag Primary Care Edison Nicholson: [facility] Other Clinician: Referring Joany Khatib: [facility] Treating Geonna Lockyer/Extender: Yaakov Guthrie in Treatment: 2 Clinic Level of Care Assessment Items TOOL 4 Quantity Score _0  - Use when only an EandM is performed on FOLLOW-UP visit 0 ASSESSMENTS - Nursing Assessment / Reassessment _1  - Reassessment of Co-morbidities (includes updates in patient status) 0 _2  - 0 Reassessment of Adherence to  Treatment Plan ASSESSMENTS - Wound and Skin Assessment / Reassessment X - Simple Wound Assessment / Reassessment - one wound 1 5 _3  - 0 Complex Wound Assessment / Reassessment - multiple wounds _4  - 0 Dermatologic / Skin Assessment (not related to wound area) ASSESSMENTS - Focused Assessment _5  - Circumferential Edema Measurements - multi extremities 0 _6  - 0 Nutritional Assessment / Counseling / Intervention _7  - 0 Lower Extremity Assessment (monofilament, tuning fork, pulses) _8  - 0 Peripheral Arterial Disease Assessment (using hand held doppler) ASSESSMENTS - Ostomy and/or Continence Assessment and Care _9  - Incontinence Assessment and Management 0 _10  - 0 Ostomy Care Assessment and Management (repouching, etc.) PROCESS - Coordination of Care X - Simple Patient / Family Education for ongoing care 1 15 _11  - 0 Complex (extensive) Patient / Family Education for ongoing care _12  - 0 Staff obtains Programmer, systems, Records, Test Results / Process Orders _13  - 0 Staff telephones HHA, Nursing Homes / Clarify orders / etc _14  - 0 Routine Transfer to another Facility (non-emergent condition) _15  - 0 Routine Hospital Admission (non-emergent condition) _16  - 0 New Admissions / Biomedical engineer / Ordering NPWT, Apligraf, etc. _17  - 0 Emergency Hospital Admission (emergent condition) X- 1 10 Simple Discharge Coordination _18  - 0 Complex (extensive) Discharge Coordination PROCESS - Special Needs _19  - Pediatric / Minor Patient Management 0 _20  - 0 Isolation Patient Management _21  - 0 Hearing / Language / Visual special needs _22  - 0 Assessment of Community assistance (transportation, D/C planning, etc.) _23  - 0 Additional assistance / Altered mentation _24  - 0 Support Surface(s) Assessment (bed, cushion, seat, etc.) INTERVENTIONS - Wound Cleansing / Measurement Mcclellan, Rashell C. ([id number]) X- 1 5 Simple Wound Cleansing - one wound _25  - 0 Complex Wound Cleansing - multiple wounds X- 1  5 Wound Imaging (photographs - any number of wounds) _26  - 0 Wound Tracing (instead of photographs) X- 1 5 Simple Wound Measurement - one wound _27  - 0  Complex Wound Measurement - multiple wounds INTERVENTIONS - Wound Dressings X - Small Wound Dressing one or multiple wounds 1 10 _0  - 0 Medium Wound Dressing one or multiple wounds _1  - 0 Large Wound Dressing one or multiple wounds X- 1 5 Application of Medications - topical <LKGMWNUUVOZDGUYQ>_0<\/HKVQQVZDGLOVFIEP>_3  - 0 Application of Medications - injection INTERVENTIONS - Miscellaneous _3  - External ear exam 0 _4  - 0 Specimen Collection (cultures, biopsies, blood, body fluids, etc.) _5  - 0 Specimen(s) / Culture(s) sent or taken to Lab for analysis _6  - 0 Patient Transfer (multiple staff / Harrel Lemon Lift / Similar devices) _7  - 0 Simple Staple / Suture removal (25 or less) _8  - 0 Complex Staple / Suture removal (26 or more) _9  - 0 Hypo / Hyperglycemic Management (close monitor of Blood Glucose) _10  - 0 Ankle / Brachial Index (ABI) - do not check if billed separately X- 1 5 Vital Signs Has the patient been seen at the hospital within the last three years: Yes Total Score: 65 Level Of Care: New/Established - Level 2 Electronic Signature(s) Signed: 05/30/2021 3:53:07 PM By: Donnamarie Poag Previous Signature: 05/30/2021 12:00:28 PM Version By: Donnamarie Poag Entered By: Donnamarie Poag on 05/30/2021 14:11:10 [name] ([id number]) -------------------------------------------------------------------------------- Encounter Discharge Information Details Patient Name: [name] Date of Service: 05/30/2021 10:45 AM Medical Record Number: [id number] Patient Account Number: [id number] Date of Birth/Sex: 1945-11-02 (75 y.o. F) Treating RN: Donnamarie Poag Primary Care Kaysa Roulhac: [facility] Other Clinician: Referring Milbern Doescher: [facility] Treating Montrez Marietta/Extender: Yaakov Guthrie in Treatment: 2 Encounter Discharge Information Items Discharge  Condition: Stable Ambulatory Status: Ambulatory Discharge Destination: Home Transportation: Private Auto Accompanied By: self Schedule Follow-up Appointment: Yes Clinical Summary of Care: Electronic Signature(s) Signed: 05/30/2021 2:12:31 PM By: Donnamarie Poag Previous Signature: 05/30/2021 12:00:28 PM Version By: Donnamarie Poag Entered By: Donnamarie Poag on 05/30/2021 14:12:31 [name] ([id number]) -------------------------------------------------------------------------------- Lower Extremity Assessment Details Patient Name: [name] Date of Service: 05/30/2021 10:45 AM Medical Record Number: [id number] Patient Account Number: [id number] Date of Birth/Sex: Aug 30, 1945 (75 y.o. F) Treating RN: Donnamarie Poag Primary Care Barb Shear: [facility] Other Clinician: Referring Swan Zayed: [facility] Treating Eddie Payette/Extender: Kalman Shan Weeks in Treatment: 2 Edema Assessment Assessed: [Left: Yes] [Right: No] Edema: [Left: N] [Right: o] Vascular Assessment Pulses: Dorsalis Pedis Palpable: [Left:Yes] Electronic Signature(s) Signed: 05/30/2021 2:09:35 PM By: Donnamarie Poag Previous Signature: 05/30/2021 12:00:28 PM Version By: Donnamarie Poag Entered By: Donnamarie Poag on 05/30/2021 14:09:35 [name] ([id number]) -------------------------------------------------------------------------------- Multi Wound Chart Details Patient Name: [name] Date of Service: 05/30/2021 10:45 AM Medical Record Number: [id number] Patient Account Number: [id number] Date of Birth/Sex: [birth year] (75 y.o. F) Treating RN: Donnamarie Poag Primary Care Pierson Vantol: [facility] Other Clinician: Referring Izik Bingman: [facility] Treating Omarrion Carmer/Extender: Yaakov Guthrie in Treatment: 2 Vital Signs Height(in): 58 Pulse(bpm): 22 Weight(lbs): 163 Blood Pressure(mmHg): 141/83 Body Mass Index(BMI): 26 Temperature(F): 97.7 Respiratory Rate(breaths/min):  16 Photos: [N/A:N/A] Wound Location: Left Knee N/A N/A Wounding Event: Hematoma N/A N/A Primary Etiology: Trauma, Other N/A N/A Comorbid History: Chronic Obstructive Pulmonary N/A N/A Disease (COPD), Hypertension, Type II Diabetes Date Acquired: 04/13/2021 N/A N/A Weeks of Treatment: 2 N/A N/A Wound Status: Open N/A N/A Clustered Wound: Yes N/A N/A Measurements L x W x D (cm) 0.6x0.5x0.5 N/A N/A Area (cm) : 0.236 N/A N/A Volume (cm) : 0.118 N/A N/A % Reduction in Area: 46.40% N/A N/A % Reduction in Volume: 83.20% N/A N/A Starting Position 1 (o'clock): 12 Ending Position  1 (o'clock): 12 Maximum Distance 1 (cm): 1 Undermining: Yes N/A N/A Classification: Full Thickness Without Exposed N/A N/A Support Structures Exudate Amount: Large N/A N/A Exudate Type: Serosanguineous N/A N/A Exudate Color: red, brown N/A N/A Granulation Amount: Large (67-100%) N/A N/A Granulation Quality: Red, Pink N/A N/A Necrotic Amount: Small (1-33%) N/A N/A Exposed Structures: Fat Layer (Subcutaneous Tissue): N/A N/A Yes Fascia: No Tendon: No Muscle: No Joint: No Bone: No Epithelialization: None N/A N/A Treatment Notes Wound #4 (Knee) Wound Laterality: Left Cleanser Schuchart, Detra C. ([id number]) Byram Ancillary Kit - 15 Day Supply Discharge Instruction: Use supplies as instructed; Kit contains: (15) Saline Bullets; (15) 3x3 Gauze; 15 pr Gloves Normal Saline Discharge Instruction: Wash your hands with soap and water. Remove old dressing, discard into plastic bag and place into trash. Cleanse the wound with Normal Saline prior to applying a clean dressing using gauze sponges, not tissues or cotton balls. Do not scrub or use excessive force. Pat dry using gauze sponges, not tissue or cotton balls. Peri-Wound Care Topical Primary Dressing Prisma 4.34 (in) Discharge Instruction: Moisten w/normal saline or sterile water; Cover wound as directed. Do not remove from wound bed. Secondary  Dressing Gauze Telfa Adhesive Island Dressing, 4x4 (in/in) Discharge Instruction: Apply over dressing to secure in place. Secured With Compression Wrap Compression Stockings Add-Ons Electronic Signature(s) Signed: 05/30/2021 2:10:39 PM By: Donnamarie Poag Previous Signature: 05/30/2021 11:11:33 AM Version By: Kalman Shan DO Entered By: Donnamarie Poag on 05/30/2021 14:10:39 [name] ([id number]) -------------------------------------------------------------------------------- Multi-Disciplinary Care Plan Details Patient Name: [name] Date of Service: 05/30/2021 10:45 AM Medical Record Number: [id number] Patient Account Number: [id number] Date of Birth/Sex: [birth year] (75 y.o. F) Treating RN: Donnamarie Poag Primary Care Coty Student: [facility] Other Clinician: Referring Cece Milhouse: [facility] Treating Eugenie Harewood/Extender: Yaakov Guthrie in Treatment: 2 Active Inactive Wound/Skin Impairment Nursing Diagnoses: Impaired tissue integrity Knowledge deficit related to smoking impact on wound healing Knowledge deficit related to ulceration/compromised skin integrity Goals: Patient will demonstrate a reduced rate of smoking or cessation of smoking Date Initiated: 05/16/2021 Target Resolution Date: 05/16/2021 Goal Status: Active Ulcer/skin breakdown will have a volume reduction of 30% by week 4 Date Initiated: 05/16/2021 Target Resolution Date: 06/15/2021 Goal Status: Active Interventions: Assess ulceration(s) every visit Treatment Activities: Skin care regimen initiated : 05/16/2021 Topical wound management initiated : 05/16/2021 Notes: Electronic Signature(s) Signed: 05/30/2021 2:10:22 PM By: Donnamarie Poag Previous Signature: 05/30/2021 12:00:28 PM Version By: Donnamarie Poag Entered By: Donnamarie Poag on 05/30/2021 [place], Brileigh C. ([id number]) -------------------------------------------------------------------------------- Pain Assessment  Details Patient Name: [name] Date of Service: 05/30/2021 10:45 AM Medical Record Number: [id number] Patient Account Number: [id number] Date of Birth/Sex: [birth year] (75 y.o. F) Treating RN: Donnamarie Poag Primary Care Marshae Azam: [facility] Other Clinician: Referring Fidelis Loth: [facility] Treating Shiann Kam/Extender: Yaakov Guthrie in Treatment: 2 Active Problems Location of Pain Severity and Description of Pain Patient Has Paino No Site Locations Rate the pain. Current Pain Level: 0 Pain Management and Medication Current Pain Management: Electronic Signature(s) Signed: 05/30/2021 2:09:21 PM By: Donnamarie Poag Previous Signature: 05/30/2021 12:00:28 PM Version By: Donnamarie Poag Entered By: Donnamarie Poag on 05/30/2021 14:09:21 [name] ([id number]) -------------------------------------------------------------------------------- Patient/Caregiver Education Details Patient Name: [name] Date of Service: 05/30/2021 10:45 AM Medical Record Number: [id number] Patient Account Number: [id number] Date of Birth/Gender: August 21, 1945 (75 y.o. F) Treating RN: Donnamarie Poag Primary Care Physician: [facility] Other Clinician: Referring Physician: [facility] Treating Physician/Extender: Yaakov Guthrie in Treatment:  2 Education Assessment Education Provided To: Patient Education Topics Provided Basic Hygiene: Smoking and Wound Healing: Wound/Skin Impairment: Electronic Signature(s) Signed: 05/30/2021 3:53:07 PM By: Donnamarie Poag Previous Signature: 05/30/2021 12:00:28 PM Version By: Donnamarie Poag Entered By: Donnamarie Poag on 05/30/2021 14:11:35 [name] ([id number]) -------------------------------------------------------------------------------- Wound Assessment Details Patient Name: [name] Date of Service: 05/30/2021 10:45 AM Medical Record Number: [id number] Patient Account Number: [id number] Date of Birth/Sex:  Oct 14, 1945 (75 y.o. F) Treating RN: Donnamarie Poag Primary Care Irem Stoneham: [facility] Other Clinician: Referring Nichoals Heyde: [facility] Treating Sheresa Cullop/Extender: Skipper Cliche in Treatment: 2 Wound Status Wound Number: 4 Primary Trauma, Other Etiology: Wound Location: Left Knee Wound Status: Open Wounding Event: Hematoma Comorbid Chronic Obstructive Pulmonary Disease (COPD), Date Acquired: 04/13/2021 History: Hypertension, Type II Diabetes Weeks Of Treatment: 2 Clustered Wound: Yes Photos Wound Measurements Length: (cm) 0.6 Width: (cm) 0.5 Depth: (cm) 0.5 Area: (cm) 0.236 Volume: (cm) 0.118 % Reduction in Area: 46.4% % Reduction in Volume: 83.2% Epithelialization: None Tunneling: No Undermining: Yes Starting Position (o'clock): 12 Ending Position (o'clock): 12 Maximum Distance: (cm) 1 Wound Description Classification: Full Thickness Without Exposed Support Structu Exudate Amount: Large Exudate Type: Serosanguineous Exudate Color: red, brown res Foul Odor After Cleansing: No Slough/Fibrino No Wound Bed Granulation Amount: Large (67-100%) Exposed Structure Granulation Quality: Red, Pink Fascia Exposed: No Necrotic Amount: Small (1-33%) Fat Layer (Subcutaneous Tissue) Exposed: Yes Necrotic Quality: Adherent Slough Tendon Exposed: No Muscle Exposed: No Joint Exposed: No Bone Exposed: No Treatment Notes Wound #4 (Knee) Wound Laterality: Left Cleanser [place] Ancillary Kit - [address] EMAN, RYNDERS C. ([id number]) Discharge Instruction: Use supplies as instructed; Kit contains: (15) Saline Bullets; (15) 3x3 Gauze; 15 pr Gloves Normal Saline Discharge Instruction: Wash your hands with soap and water. Remove old dressing, discard into plastic bag and place into trash. Cleanse the wound with Normal Saline prior to applying a clean dressing using gauze sponges, not tissues or cotton balls. Do not scrub or use excessive force. Pat dry using gauze  sponges, not tissue or cotton balls. Peri-Wound Care Topical Primary Dressing Prisma 4.34 (in) Discharge Instruction: Moisten w/normal saline or sterile water; Cover wound as directed. Do not remove from wound bed. Secondary Dressing Gauze Telfa Adhesive Island Dressing, 4x4 (in/in) Discharge Instruction: Apply over dressing to secure in place. Secured With Compression Wrap Compression Stockings Add-Ons Electronic Signature(s) Signed: 05/30/2021 12:00:28 PM By: Donnamarie Poag Entered By: Donnamarie Poag on 05/30/2021 10:56:20 MARCIANNE, OZBUN ([id number]) -------------------------------------------------------------------------------- Vitals Details Patient Name: [name] Date of Service: 05/30/2021 10:45 AM Medical Record Number: [id number] Patient Account Number: [id number] Date of Birth/Sex: 10/23/1945 (75 y.o. F) Treating RN: Donnamarie Poag Primary Care Jordyn Hofacker: [facility] Other Clinician: Referring Shaynna Husby: [facility] Treating Tamer Baughman/Extender: Yaakov Guthrie in Treatment: 2 Vital Signs Time Taken: 10:50 Temperature (F): 97.7 Height (in): 67 Pulse (bpm): 82 Weight (lbs): 163 Respiratory Rate (breaths/min): 16 Body Mass Index (BMI): 25.5 Blood Pressure (mmHg): 141/83 Reference Range: 80 - 120 mg / dl Electronic Signature(s) Signed: 05/30/2021 2:09:13 PM By: Donnamarie Poag Previous Signature: 05/30/2021 12:00:28 PM Version By: Donnamarie Poag Entered ByDonnamarie Poag on 05/30/2021 14:09:12

## 2021-05-31 NOTE — Progress Notes (Signed)
VIDALIA, SERPAS ([id number]) Visit Report for 05/30/2021 Chief Complaint Document Details [name], [name] Date of Service: 05/30/2021 10:45 AM Medical Record Number: [id number] Patient Account Number: [id number] Date of Birth/Sex: 21-Feb-1946 (75 y.o. F) Treating RN: Donnamarie Poag Primary Care Provider: [facility] Other Clinician: Referring Provider: [facility] Treating Provider/Extender: Yaakov Guthrie in Treatment: 2 Information Obtained from: Patient Chief Complaint Patient presents for a left knee wound status post fall Electronic Signature(s) Signed: 05/30/2021 5:00:18 PM By: Kalman Shan DO Signed: 05/30/2021 5:00:36 PM By: Gretta Cool, BSN, RN, CWS, Kim RN, BSN Previous Signature: 05/30/2021 11:11:33 AM Version By: Kalman Shan DO Entered By: Gretta Cool BSN, RN, CWS, Kim on 05/30/2021 16:59:18 DAMA, HEDGEPETH ([id number]) -------------------------------------------------------------------------------- HPI Details [name] Date of Service: 05/30/2021 10:45 AM Medical Record Number: [id number] Patient Account Number: [id number] Date of Birth/Sex: 10-28-1945 (75 y.o. F) Treating RN: Donnamarie Poag Primary Care Provider: [facility] Other Clinician: Referring Provider: [facility] Treating Provider/Extender: Yaakov Guthrie in Treatment: 2 History of Present Illness Location: right lower extremity in the area of the lateral calf below the knee Quality: She is having no pain Context: The wound occurred when the patient a blunt injury and fall injury Modifying Factors: None as patient was healed Associated Signs and Symptoms: Patient reports having increase discharge. HPI Description: Admission 05/16/2021 Ms. [name] is a 75 year old female with a past medical history of type 2 diabetes controlled on metformin and essential hypertension that presents to the clinic for a right knee wound. She  states that 6 weeks ago she fell tripping over her ottoman onto her left knee. She states that she put ice on the area and has not used any other treatment modalities. She presents today with scabs on her knee. She states she actually did not want to follow-up but her son recommended it. She has not seen any other healthcare provider for this issue. She has not been on antibiotics for this. She currently denies pain or signs of infection. 11/9; patient presents for 1 week follow-up. She has been using iodoform packing to the wound bed without issues. She denies signs of infection. She has no issues or complaints today. 11/16; patient presents for 1 week follow-up. She has been using iodoform packing to the wound bed. She has no issues or complaints today. She denies signs of infection. Electronic Signature(s) Signed: 05/30/2021 5:00:18 PM By: Kalman Shan DO Signed: 05/30/2021 5:00:36 PM By: Gretta Cool, BSN, RN, CWS, Kim RN, BSN Previous Signature: 05/30/2021 11:11:33 AM Version By: Kalman Shan DO Entered By: Gretta Cool BSN, RN, CWS, Kim on 05/30/2021 16:59:28 [name], [name] ([id number]) -------------------------------------------------------------------------------- Physical Exam Details [name] Date of Service: 05/30/2021 10:45 AM Medical Record Number: [id number] Patient Account Number: [id number] Date of Birth/Sex: Jul 23, 1945 (75 y.o. F) Treating RN: Donnamarie Poag Primary Care Provider: [facility] Other Clinician: Referring Provider: [facility] Treating Provider/Extender: Yaakov Guthrie in Treatment: 2 Constitutional . Cardiovascular . Psychiatric . Notes Left knee with granulation tissue to the open wound and undermining of 0.5 cm at the 5 o'clock position. Appears well-healing. No signs of infection. No drainage noted on palpation. Electronic Signature(s) Signed: 05/30/2021 5:00:18 PM By: Kalman Shan DO Signed: 05/30/2021  5:00:36 PM By: Gretta Cool, BSN, RN, CWS, Kim RN, BSN Previous Signature: 05/30/2021 11:11:33 AM Version By: Kalman Shan DO Entered By: Gretta Cool BSN, RN, CWS, Kim on 05/30/2021 16:59:45 FLOREEN, TEEGARDEN ([id number]) -------------------------------------------------------------------------------- Physician Orders Details  Patient Name: Janet, [name] Date of Service: 05/30/2021 10:45 AM Medical Record Number: [id number] Patient Account Number: [id number] Date of Birth/Sex: 07/12/1946 (75 y.o. F) Treating RN: Donnamarie Poag Primary Care Provider: [facility] Other Clinician: Referring Provider: [facility] Treating Provider/Extender: Yaakov Guthrie in Treatment: 2 Verbal / Phone Orders: No Diagnosis Coding Follow-up Appointments o Return Appointment in 1 week. o Nurse Visit as needed Bathing/ Shower/ Hygiene o Clean wound with Normal Saline or wound cleanser. o May shower; gently cleanse wound with antibacterial soap, rinse and pat dry prior to dressing wounds - change dressing after shower/keep it dry o No tub bath. Edema Control - Lymphedema / Segmental Compressive Device / Other o Elevate leg(s) parallel to the floor when sitting. o DO YOUR BEST to sleep in the bed at night. DO NOT sleep in your recliner. Long hours of sitting in a recliner leads to swelling of the legs and/or potential wounds on your backside. Additional Orders / Instructions o Follow Nutritious Diet and Increase Protein Intake Wound Treatment Wound #4 - Knee Wound Laterality: Left Cleanser: Byram Ancillary Kit - 15 Day Supply (DME) (Generic) Every Other Day/15 Days Discharge Instructions: Use supplies as instructed; Kit contains: (15) Saline Bullets; (15) 3x3 Gauze; 15 pr Gloves Cleanser: Normal Saline (Generic) Every Other Day/15 Days Discharge Instructions: Wash your hands with soap and water. Remove old dressing, discard into plastic bag and place into trash. Cleanse the  wound with Normal Saline prior to applying a clean dressing using gauze sponges, not tissues or cotton balls. Do not scrub or use excessive force. Pat dry using gauze sponges, not tissue or cotton balls. Primary Dressing: Prisma 4.34 (in) Every Other Day/15 Days Discharge Instructions: Moisten w/normal saline or sterile water; Cover wound as directed. Do not remove from wound bed. Secondary Dressing: Gauze Every Other Day/15 Days Secondary Dressing: [place] Dressing, 4x4 (in/in) (DME) (Generic) Every Other Day/15 Days Discharge Instructions: Apply over dressing to secure in place. Electronic Signature(s) Signed: 05/30/2021 3:13:26 PM By: Kalman Shan DO Signed: 05/30/2021 3:53:07 PM By: Donnamarie Poag Previous Signature: 05/30/2021 12:00:28 PM Version By: Donnamarie Poag Entered By: Donnamarie Poag on 05/30/2021 14:11:01 [name] ([id number]) -------------------------------------------------------------------------------- Problem List Details [name] Date of Service: 05/30/2021 10:45 AM Medical Record Number: [id number] Patient Account Number: [id number] Date of Birth/Sex: 07/18/1945 (75 y.o. F) Treating RN: Donnamarie Poag Primary Care Provider: [facility] Other Clinician: Referring Provider: [facility] Treating Provider/Extender: Yaakov Guthrie in Treatment: 2 Active Problems ICD-10 Encounter Code Description Active Date MDM Diagnosis L97.822 Non-pressure chronic ulcer of other part of left lower leg with fat layer 05/16/2021 No Yes exposed E11.622 Type 2 diabetes mellitus with other skin ulcer 05/16/2021 No Yes Inactive Problems Resolved Problems Electronic Signature(s) Signed: 05/30/2021 2:11:51 PM By: Donnamarie Poag Signed: 05/30/2021 3:13:26 PM By: Kalman Shan DO Previous Signature: 05/30/2021 11:11:33 AM Version By: Kalman Shan DO Entered By: Donnamarie Poag on 05/30/2021 14:11:51 [name]  ([id number]) -------------------------------------------------------------------------------- Progress Note Details [name] Date of Service: 05/30/2021 10:45 AM Medical Record Number: [id number] Patient Account Number: [id number] Date of Birth/Sex: 06/05/1946 (75 y.o. F) Treating RN: Donnamarie Poag Primary Care Provider: [facility] Other Clinician: Referring Provider: [facility] Treating Provider/Extender: Yaakov Guthrie in Treatment: 2 Subjective Chief Complaint Information obtained from Patient Patient presents for a left knee wound status post fall History of Present Illness (HPI) The following HPI elements were documented for  the patient's wound: Location: right lower extremity in the area of the lateral calf below the knee Quality: She is having no pain Context: The wound occurred when the patient a blunt injury and fall injury Modifying Factors: None as patient was healed Associated Signs and Symptoms: Patient reports having increase discharge. Admission 05/16/2021 Ms. Nyoka Alcoser is a 75 year old female with a past medical history of type 2 diabetes controlled on metformin and essential hypertension that presents to the clinic for a right knee wound. She states that 6 weeks ago she fell tripping over her ottoman onto her left knee. She states that she put ice on the area and has not used any other treatment modalities. She presents today with scabs on her knee. She states she actually did not want to follow-up but her son recommended it. She has not seen any other healthcare provider for this issue. She has not been on antibiotics for this. She currently denies pain or signs of infection. 11/9; patient presents for 1 week follow-up. She has been using iodoform packing to the wound bed without issues. She denies signs of infection. She has no issues or complaints today. 11/16; patient presents for 1 week follow-up. She has been using  iodoform packing to the wound bed. She has no issues or complaints today. She denies signs of infection. Patient History Information obtained from Patient. Social History Former smoker, Marital Status - Married, Alcohol Use - Never, Drug Use - No History, Caffeine Use - Daily. Medical History Eyes Denies history of Cataracts, Glaucoma, Optic Neuritis Ear/Nose/Mouth/Throat Denies history of Chronic sinus problems/congestion, Middle ear problems Hematologic/Lymphatic Denies history of Anemia, Hemophilia, Human Immunodeficiency Virus, Lymphedema, Sickle Cell Disease Respiratory Patient has history of Chronic Obstructive Pulmonary Disease (COPD) Denies history of Aspiration, Asthma, Pneumothorax, Sleep Apnea, Tuberculosis Cardiovascular Patient has history of Hypertension Denies history of Angina, Arrhythmia, Congestive Heart Failure, Coronary Artery Disease, Deep Vein Thrombosis, Hypotension, Myocardial Infarction, Peripheral Arterial Disease, Peripheral Venous Disease, Phlebitis, Vasculitis Gastrointestinal Denies history of Cirrhosis , Colitis, Crohn s, Hepatitis A, Hepatitis B, Hepatitis C Endocrine Patient has history of Type II Diabetes Denies history of Type I Diabetes Immunological Denies history of Lupus Erythematosus, Raynaud s, Scleroderma Integumentary (Skin) Denies history of History of Burn, History of pressure wounds Musculoskeletal Denies history of Gout, Rheumatoid Arthritis, Osteoarthritis, Osteomyelitis Neurologic Denies history of Dementia, Neuropathy, Quadriplegia, Paraplegia, Seizure Disorder Oncologic Denies history of Received Chemotherapy, Received Radiation Medical And Surgical History Notes Cardiovascular hx SVT - followed by Dr Dorrene German, Leilani Able ([id number]) Objective Constitutional Vitals Time Taken: 10:50 AM, Height: 67 in, Weight: 163 lbs, BMI: 25.5, Temperature: 97.7 F, Pulse: 82 bpm, Respiratory Rate: 16 breaths/min, Blood Pressure: 141/83  mmHg. General Notes: Left knee with granulation tissue to the open wound and undermining of 0.5 cm at the 5 o'clock position. Appears well-healing. No signs of infection. No drainage noted on palpation. Integumentary (Hair, Skin) Wound #4 status is Open. Original cause of wound was Hematoma. The date acquired was: 04/13/2021. The wound has been in treatment 2 weeks. The wound is located on the Left Knee. The wound measures 0.6cm length x 0.5cm width x 0.5cm depth; 0.236cm^2 area and 0.118cm^3 volume. There is Fat Layer (Subcutaneous Tissue) exposed. There is no tunneling noted, however, there is undermining starting at 12:00 and ending at 12:00 with a maximum distance of 1cm. There is a large amount of serosanguineous drainage noted. There is large (67- 100%) red, pink granulation within the wound bed. There is a small (  1-33%) amount of necrotic tissue within the wound bed including Adherent Slough. Assessment Active Problems ICD-10 Non-pressure chronic ulcer of other part of left lower leg with fat layer exposed Type 2 diabetes mellitus with other skin ulcer Patient's wound has shown improvement in size in appearance since last clinic visit. She has minimal undermining at this time. She no longer needs iodoform packing but would benefit from collagen. Follow-up in 1 week. Plan Follow-up Appointments: Return Appointment in 1 week. Nurse Visit as needed Bathing/ Shower/ Hygiene: Clean wound with Normal Saline or wound cleanser. May shower; gently cleanse wound with antibacterial soap, rinse and pat dry prior to dressing wounds - change dressing after shower/keep it dry No tub bath. Edema Control - Lymphedema / Segmental Compressive Device / Other: Elevate leg(s) parallel to the floor when sitting. DO YOUR BEST to sleep in the bed at night. DO NOT sleep in your recliner. Long hours of sitting in a recliner leads to swelling of the legs and/or potential wounds on your backside. Additional  Orders / Instructions: Follow Nutritious Diet and Increase Protein Intake WOUND #4: - Knee Wound Laterality: Left Cleanser: Byram Ancillary Kit - 15 Day Supply (DME) (Generic) Every Other Day/15 Days Discharge Instructions: Use supplies as instructed; Kit contains: (15) Saline Bullets; (15) 3x3 Gauze; 15 pr Gloves Cleanser: Normal Saline (Generic) Every Other Day/15 Days Discharge Instructions: Wash your hands with soap and water. Remove old dressing, discard into plastic bag and place into trash. Cleanse the wound with Normal Saline prior to applying a clean dressing using gauze sponges, not tissues or cotton balls. Do not scrub or use excessive force. Pat dry using gauze sponges, not tissue or cotton balls. Primary Dressing: Prisma 4.34 (in) Every Other Day/15 Days Discharge Instructions: Moisten w/normal saline or sterile water; Cover wound as directed. Do not remove from wound bed. Secondary Dressing: Gauze Every Other Day/15 Days SHARANDA, SHINAULT ([id number]) Secondary Dressing: [place] Dressing, 4x4 (in/in) (DME) (Generic) Every Other Day/15 Days Discharge Instructions: Apply over dressing to secure in place. 1. Collagen 2. Follow-up in 1 week Electronic Signature(s) Signed: 05/30/2021 5:00:18 PM By: Kalman Shan DO Signed: 05/30/2021 5:00:36 PM By: Gretta Cool, BSN, RN, CWS, Kim RN, BSN Previous Signature: 05/30/2021 11:11:33 AM Version By: Kalman Shan DO Entered By: Gretta Cool BSN, RN, CWS, Kim on 05/30/2021 17:00:00 STAVROULA, ROHDE ([id number]) -------------------------------------------------------------------------------- ROS/PFSH Details [name] Date of Service: 05/30/2021 10:45 AM Medical Record Number: [id number] Patient Account Number: [id number] Date of Birth/Sex: January 24, 1946 (75 y.o. F) Treating RN: Donnamarie Poag Primary Care Provider: [facility] Other Clinician: Referring Provider: [facility] Treating  Provider/Extender: Yaakov Guthrie in Treatment: 2 Information Obtained From Patient Eyes Medical History: Negative for: Cataracts; Glaucoma; Optic Neuritis Ear/Nose/Mouth/Throat Medical History: Negative for: Chronic sinus problems/congestion; Middle ear problems Hematologic/Lymphatic Medical History: Negative for: Anemia; Hemophilia; Human Immunodeficiency Virus; Lymphedema; Sickle Cell Disease Respiratory Medical History: Positive for: Chronic Obstructive Pulmonary Disease (COPD) Negative for: Aspiration; Asthma; Pneumothorax; Sleep Apnea; Tuberculosis Cardiovascular Medical History: Positive for: Hypertension Negative for: Angina; Arrhythmia; Congestive Heart Failure; Coronary Artery Disease; Deep Vein Thrombosis; Hypotension; Myocardial Infarction; Peripheral Arterial Disease; Peripheral Venous Disease; Phlebitis; Vasculitis Past Medical History Notes: hx SVT - followed by Dr Ubaldo Glassing Gastrointestinal Medical History: Negative for: Cirrhosis ; Colitis; Crohnos; Hepatitis A; Hepatitis B; Hepatitis C Endocrine Medical History: Positive for: Type II Diabetes Negative for: Type I Diabetes Time with diabetes: 10 years Treated with: Oral agents Immunological Medical History: Negative for: Lupus Erythematosus;  Raynaudos; Scleroderma Integumentary (Skin) Medical History: Negative for: History of Burn; History of pressure wounds Musculoskeletal Moor, Addi C. ([id number]) Medical History: Negative for: Gout; Rheumatoid Arthritis; Osteoarthritis; Osteomyelitis Neurologic Medical History: Negative for: Dementia; Neuropathy; Quadriplegia; Paraplegia; Seizure Disorder Oncologic Medical History: Negative for: Received Chemotherapy; Received Radiation Immunizations Pneumococcal Vaccine: Received Pneumococcal Vaccination: Yes Received Pneumococcal Vaccination On or After 60th Birthday: Yes Immunization Notes: up to date Implantable Devices No devices added Family and  Social History Former smoker; Marital Status - Married; Alcohol Use: Never; Drug Use: No History; Caffeine Use: Daily; Financial Concerns: No; Food, Clothing or Shelter Needs: No; Support System Lacking: No; Transportation Concerns: No Electronic Signature(s) Signed: 05/30/2021 5:00:18 PM By: Kalman Shan DO Signed: 05/30/2021 5:00:36 PM By: Gretta Cool, BSN, RN, CWS, Kim RN, BSN Signed: 05/31/2021 12:33:32 PM By: Donnamarie Poag Previous Signature: 05/30/2021 11:11:33 AM Version By: Kalman Shan DO Previous Signature: 05/30/2021 12:00:28 PM Version By: Donnamarie Poag Entered By: Gretta Cool BSN, RN, CWS, Kim on 05/30/2021 16:59:37 IOLANDA, FOLSON ([id number]) -------------------------------------------------------------------------------- [place] Details [name] Date of Service: 05/30/2021 Medical Record Number: [id number] Patient Account Number: [id number] Date of Birth/Sex: 06-14-1946 (75 y.o. F) Treating RN: Donnamarie Poag Primary Care Provider: [facility] Other Clinician: Referring Provider: [facility] Treating Provider/Extender: Yaakov Guthrie in Treatment: 2 Diagnosis Coding ICD-10 Codes Code Description [phone] Non-pressure chronic ulcer of other part of left lower leg with fat layer exposed E11.622 Type 2 diabetes mellitus with other skin ulcer Facility Procedures CPT4 Code: [id number] Description: [phone] - WOUND CARE VISIT-LEV 2 EST PT Modifier: Quantity: 1 Physician Procedures CPT4 Code: [id number] Description: 49826 - WC PHYS LEVEL 3 - EST PT Modifier: Quantity: 1 CPT4 Code: Description: ICD-10 Diagnosis Description L97.822 Non-pressure chronic ulcer of other part of left lower leg with fat lay E11.622 Type 2 diabetes mellitus with other skin ulcer Modifier: er exposed Quantity: Electronic Signature(s) Signed: 05/30/2021 2:11:25 PM By: Donnamarie Poag Signed: 05/30/2021 3:13:26 PM By: Kalman Shan DO Previous Signature:  05/30/2021 11:11:33 AM Version By: Kalman Shan DO Entered By: Donnamarie Poag on 05/30/2021 14:11:25

## 2021-06-06 ENCOUNTER — Other Ambulatory Visit: Payer: Self-pay

## 2021-06-06 ENCOUNTER — Encounter (HOSPITAL_BASED_OUTPATIENT_CLINIC_OR_DEPARTMENT_OTHER): Payer: Medicare Other | Admitting: Internal Medicine

## 2021-06-06 DIAGNOSIS — L97822 Non-pressure chronic ulcer of other part of left lower leg with fat layer exposed: Secondary | ICD-10-CM

## 2021-06-06 DIAGNOSIS — E11622 Type 2 diabetes mellitus with other skin ulcer: Secondary | ICD-10-CM | POA: Diagnosis not present

## 2021-06-06 NOTE — Progress Notes (Signed)
[name], [name] ([id number]) Visit Report for 06/06/2021 Chief Complaint Document Details Patient Name: [name], [name] Date of Service: 06/06/2021 11:30 AM Medical Record Number: [id number] Patient Account Number: [id number] Date of Birth/Sex: [birth year] (75 y.o. F) Treating RN: Cornell Barman Primary Care Provider: [facility], [place] Other Clinician: Referring Provider: [facility] Treating Provider/Extender: Yaakov Guthrie in Treatment: 3 Information Obtained from: Patient Chief Complaint Patient presents for a left knee wound status post fall Electronic Signature(s) Signed: 06/06/2021 12:53:18 PM By: Kalman Shan DO Entered By: Kalman Shan on 06/06/2021 12:48:33 [name] ([id number]) -------------------------------------------------------------------------------- Debridement Details [name] Date of Service: 06/06/2021 11:30 AM Medical Record Number: [id number] Patient Account Number: [id number] Date of Birth/Sex: Feb 23, 1946 (75 y.o. F) Treating RN: Donnamarie Poag Primary Care Provider: [facility] Other Clinician: Referring Provider: [facility] Treating Provider/Extender: Yaakov Guthrie in Treatment: 3 Debridement Performed for Wound #4 Left Knee Assessment: Performed By: Physician Kalman Shan, MD Debridement Type: Debridement Level of Consciousness (Pre- Awake and Alert procedure): Pre-procedure Verification/Time Out Yes - 12:15 Taken: Start Time: 12:16 Pain Control: Lidocaine Total Area Debrided (L x W): 0.4 (cm) x 0.4 (cm) = 0.16 (cm) Tissue and other material Viable, Non-Viable, Slough, Subcutaneous, Slough debrided: Level: Skin/Subcutaneous Tissue Debridement Description: Excisional Instrument: Curette Bleeding: Minimum Hemostasis Achieved: Pressure Response to Treatment: Procedure was tolerated well Level of Consciousness (Post- Awake and Alert procedure): Post  Debridement Measurements of Total Wound Length: (cm) 0.4 Width: (cm) 0.4 Depth: (cm) 0.3 Volume: (cm) 0.038 Character of Wound/Ulcer Post Debridement: Improved Post Procedure Diagnosis Same as Pre-procedure Electronic Signature(s) Signed: 06/06/2021 12:53:18 PM By: Kalman Shan DO Signed: 06/06/2021 3:34:54 PM By: Donnamarie Poag Entered By: Donnamarie Poag on 06/06/2021 12:25:20 [name] ([id number]) -------------------------------------------------------------------------------- HPI Details [name] Date of Service: 06/06/2021 11:30 AM Medical Record Number: [id number] Patient Account Number: [id number] Date of Birth/Sex: 28-Jul-1945 (75 y.o. F) Treating RN: Cornell Barman Primary Care Provider: [facility], [place] Other Clinician: Referring Provider: [facility] Treating Provider/Extender: Yaakov Guthrie in Treatment: 3 History of Present Illness Location: right lower extremity in the area of the lateral calf below the knee Quality: She is having no pain Context: The wound occurred when the patient a blunt injury and fall injury Modifying Factors: None as patient was healed Associated Signs and Symptoms: Patient reports having increase discharge. HPI Description: Admission 05/16/2021 [name] is a 75 year old female with a past medical history of type 2 diabetes controlled on metformin and essential hypertension that presents to the clinic for a right knee wound. She states that 6 weeks ago she fell tripping over her ottoman onto her left knee. She states that she put ice on the area and has not used any other treatment modalities. She presents today with scabs on her knee. She states she actually did not want to follow-up but her son recommended it. She has not seen any other healthcare provider for this issue. She has not been on antibiotics for this. She currently denies pain or signs of infection. 11/9; patient presents for 1 week  follow-up. She has been using iodoform packing to the wound bed without issues. She denies signs of infection. She has no issues or complaints today. 11/16; patient presents for 1 week follow-up. She has been using iodoform packing to the wound bed. She has no issues or complaints today. She denies signs of infection. 11/23; patient presents for 1 week follow-up. She has been using  collagen to the wound bed. She reports improvement in wound healing. She denies signs of infection. Electronic Signature(s) Signed: 06/06/2021 12:53:18 PM By: Kalman Shan DO Entered By: Kalman Shan on 06/06/2021 12:48:54 [name] ([id number]) -------------------------------------------------------------------------------- Physical Exam Details [name] Date of Service: 06/06/2021 11:30 AM Medical Record Number: [id number] Patient Account Number: [id number] Date of Birth/Sex: 1946/02/15 (75 y.o. F) Treating RN: Cornell Barman Primary Care Provider: [facility], [place] Other Clinician: Referring Provider: [facility] Treating Provider/Extender: Yaakov Guthrie in Treatment: 3 Constitutional . Cardiovascular . Psychiatric . Notes Left knee with granulation tissue to the open wound with nonviable tissue and circumferential undermining with deepest measurement of 0.3 cm at the 5 o'clock position. Appears well-healing. No signs of infection. No drainage noted on palpation. Electronic Signature(s) Signed: 06/06/2021 12:53:18 PM By: Kalman Shan DO Entered By: Kalman Shan on 06/06/2021 12:50:58 [name] ([id number]) -------------------------------------------------------------------------------- Physician Orders Details [name] Date of Service: 06/06/2021 11:30 AM Medical Record Number: [id number] Patient Account Number: [id number] Date of Birth/Sex: 1945-10-16 (75 y.o. F) Treating RN: Donnamarie Poag Primary Care Provider:  [facility] Other Clinician: Referring Provider: [facility] Treating Provider/Extender: Yaakov Guthrie in Treatment: 3 Verbal / Phone Orders: No Diagnosis Coding Follow-up Appointments o Return Appointment in 1 week. o Nurse Visit as needed Bathing/ Shower/ Hygiene o Clean wound with Normal Saline or wound cleanser. o May shower; gently cleanse wound with antibacterial soap, rinse and pat dry prior to dressing wounds - change dressing after shower/keep it dry o No tub bath. Edema Control - Lymphedema / Segmental Compressive Device / Other o Elevate leg(s) parallel to the floor when sitting. o DO YOUR BEST to sleep in the bed at night. DO NOT sleep in your recliner. Long hours of sitting in a recliner leads to swelling of the legs and/or potential wounds on your backside. Additional Orders / Instructions o Follow Nutritious Diet and Increase Protein Intake Wound Treatment Wound #4 - Knee Wound Laterality: Left Cleanser: Byram Ancillary Kit - 15 Day Supply (Generic) Every Other Day/15 Days Discharge Instructions: Use supplies as instructed; Kit contains: (15) Saline Bullets; (15) 3x3 Gauze; 15 pr Gloves Cleanser: Normal Saline (Generic) Every Other Day/15 Days Discharge Instructions: Wash your hands with soap and water. Remove old dressing, discard into plastic bag and place into trash. Cleanse the wound with Normal Saline prior to applying a clean dressing using gauze sponges, not tissues or cotton balls. Do not scrub or use excessive force. Pat dry using gauze sponges, not tissue or cotton balls. Primary Dressing: Prisma 4.34 (in) Every Other Day/15 Days Discharge Instructions: Moisten w/normal saline or sterile water; Cover wound as directed. Do not remove from wound bed. Secondary Dressing: [place] Dressing, 4x4 (in/in) (Generic) Every Other Day/15 Days Discharge Instructions: Apply over dressing to secure in place. Electronic  Signature(s) Signed: 06/06/2021 12:53:18 PM By: Kalman Shan DO Signed: 06/06/2021 3:34:54 PM By: Donnamarie Poag Entered By: Donnamarie Poag on 06/06/2021 12:25:54 [name] ([id number]) -------------------------------------------------------------------------------- Problem List Details [name] Date of Service: 06/06/2021 11:30 AM Medical Record Number: [id number] Patient Account Number: [id number] Date of Birth/Sex: 09/20/1945 (75 y.o. F) Treating RN: Cornell Barman Primary Care Provider: [facility], [place] Other Clinician: Referring Provider: [facility], [place] Treating Provider/Extender: Yaakov Guthrie in Treatment: 3 Active Problems ICD-10 Encounter Code Description Active Date MDM Diagnosis L97.822 Non-pressure chronic ulcer of other part of left lower leg with fat  layer 05/16/2021 No Yes exposed E11.622 Type 2 diabetes mellitus with other skin ulcer 05/16/2021 No Yes Inactive Problems Resolved Problems Electronic Signature(s) Signed: 06/06/2021 12:53:18 PM By: Kalman Shan DO Entered By: Kalman Shan on 06/06/2021 12:48:15 [name] ([id number]) -------------------------------------------------------------------------------- Progress Note Details [name] Date of Service: 06/06/2021 11:30 AM Medical Record Number: [id number] Patient Account Number: [id number] Date of Birth/Sex: [birth year] (75 y.o. F) Treating RN: Cornell Barman Primary Care Provider: [facility], [place] Other Clinician: Referring Provider: [facility] Treating Provider/Extender: Yaakov Guthrie in Treatment: 3 Subjective Chief Complaint Information obtained from Patient Patient presents for a left knee wound status post fall History of Present Illness (HPI) The following HPI elements were documented for the patient's wound: Location: right lower extremity in the area of the lateral calf below the knee Quality: She is  having no pain Context: The wound occurred when the patient a blunt injury and fall injury Modifying Factors: None as patient was healed Associated Signs and Symptoms: Patient reports having increase discharge. Admission 05/16/2021 Ms. Miriam Liles is a 75 year old female with a past medical history of type 2 diabetes controlled on metformin and essential hypertension that presents to the clinic for a right knee wound. She states that 6 weeks ago she fell tripping over her ottoman onto her left knee. She states that she put ice on the area and has not used any other treatment modalities. She presents today with scabs on her knee. She states she actually did not want to follow-up but her son recommended it. She has not seen any other healthcare provider for this issue. She has not been on antibiotics for this. She currently denies pain or signs of infection. 11/9; patient presents for 1 week follow-up. She has been using iodoform packing to the wound bed without issues. She denies signs of infection. She has no issues or complaints today. 11/16; patient presents for 1 week follow-up. She has been using iodoform packing to the wound bed. She has no issues or complaints today. She denies signs of infection. 11/23; patient presents for 1 week follow-up. She has been using collagen to the wound bed. She reports improvement in wound healing. She denies signs of infection. Patient History Information obtained from Patient. Social History Former smoker, Marital Status - Married, Alcohol Use - Never, Drug Use - No History, Caffeine Use - Daily. Medical History Eyes Denies history of Cataracts, Glaucoma, Optic Neuritis Ear/Nose/Mouth/Throat Denies history of Chronic sinus problems/congestion, Middle ear problems Hematologic/Lymphatic Denies history of Anemia, Hemophilia, Human Immunodeficiency Virus, Lymphedema, Sickle Cell Disease Respiratory Patient has history of Chronic Obstructive Pulmonary  Disease (COPD) Denies history of Aspiration, Asthma, Pneumothorax, Sleep Apnea, Tuberculosis Cardiovascular Patient has history of Hypertension Denies history of Angina, Arrhythmia, Congestive Heart Failure, Coronary Artery Disease, Deep Vein Thrombosis, Hypotension, Myocardial Infarction, Peripheral Arterial Disease, Peripheral Venous Disease, Phlebitis, Vasculitis Gastrointestinal Denies history of Cirrhosis , Colitis, Crohn s, Hepatitis A, Hepatitis B, Hepatitis C Endocrine Patient has history of Type II Diabetes Denies history of Type I Diabetes Immunological Denies history of Lupus Erythematosus, Raynaud s, Scleroderma Integumentary (Skin) Denies history of History of Burn, History of pressure wounds Musculoskeletal Denies history of Gout, Rheumatoid Arthritis, Osteoarthritis, Osteomyelitis Neurologic Denies history of Dementia, Neuropathy, Quadriplegia, Paraplegia, Seizure Disorder Oncologic Denies history of Received Chemotherapy, Received Radiation AITANA, BURRY ([id number]) Medical And Surgical History Notes Cardiovascular hx SVT - followed by Dr Ubaldo Glassing Objective Constitutional Vitals Time Taken: 12:15 PM, Height: 67 in, Weight: 163 lbs,  BMI: 25.5, Temperature: 98 F, Pulse: 80 bpm, Respiratory Rate: 16 breaths/min, Blood Pressure: 145/83 mmHg. General Notes: Left knee with granulation tissue to the open wound with nonviable tissue and circumferential undermining with deepest measurement of 0.3 cm at the 5 o'clock position. Appears well-healing. No signs of infection. No drainage noted on palpation. Integumentary (Hair, Skin) Wound #4 status is Open. Original cause of wound was Hematoma. The date acquired was: 04/13/2021. The wound has been in treatment 3 weeks. The wound is located on the Left Knee. The wound measures 0.4cm length x 0.4cm width x 0.3cm depth; 0.126cm^2 area and 0.038cm^3 volume. There is Fat Layer (Subcutaneous Tissue) exposed. There is no tunneling noted,  however, there is undermining starting at 12:00 and ending at 12:00 with a maximum distance of 0.3cm. There is a large amount of serosanguineous drainage noted. There is large (67- 100%) red, pink granulation within the wound bed. There is a small (1-33%) amount of necrotic tissue within the wound bed including Adherent Slough. Assessment Active Problems ICD-10 Non-pressure chronic ulcer of other part of left lower leg with fat layer exposed Type 2 diabetes mellitus with other skin ulcer Patient's wound has improved in size in appearance since last clinic visit. I debrided nonviable tissue. I recommended continuing collagen to the wound bed. She is healing quite nicely. No signs of infection on exam. Follow-up in 1 week. Procedures Wound #4 Pre-procedure diagnosis of Wound #4 is a Trauma, Other located on the Left Knee . There was a Excisional Skin/Subcutaneous Tissue Debridement with a total area of 0.16 sq cm performed by Kalman Shan, MD. With the following instrument(s): Curette to remove Viable and Non-Viable tissue/material. Material removed includes Subcutaneous Tissue and Slough and after achieving pain control using Lidocaine. A time out was conducted at 12:15, prior to the start of the procedure. A Minimum amount of bleeding was controlled with Pressure. The procedure was tolerated well. Post Debridement Measurements: 0.4cm length x 0.4cm width x 0.3cm depth; 0.038cm^3 volume. Character of Wound/Ulcer Post Debridement is improved. Post procedure Diagnosis Wound #4: Same as Pre-Procedure Plan Follow-up Appointments: Return Appointment in 1 week. Nurse Visit as needed ANNALISSA, MURPHEY ([id number]) Bathing/ Shower/ Hygiene: Clean wound with Normal Saline or wound cleanser. May shower; gently cleanse wound with antibacterial soap, rinse and pat dry prior to dressing wounds - change dressing after shower/keep it dry No tub bath. Edema Control - Lymphedema / Segmental Compressive  Device / Other: Elevate leg(s) parallel to the floor when sitting. DO YOUR BEST to sleep in the bed at night. DO NOT sleep in your recliner. Long hours of sitting in a recliner leads to swelling of the legs and/or potential wounds on your backside. Additional Orders / Instructions: Follow Nutritious Diet and Increase Protein Intake WOUND #4: - Knee Wound Laterality: Left Cleanser: Byram Ancillary Kit - 15 Day Supply (Generic) Every Other Day/15 Days Discharge Instructions: Use supplies as instructed; Kit contains: (15) Saline Bullets; (15) 3x3 Gauze; 15 pr Gloves Cleanser: Normal Saline (Generic) Every Other Day/15 Days Discharge Instructions: Wash your hands with soap and water. Remove old dressing, discard into plastic bag and place into trash. Cleanse the wound with Normal Saline prior to applying a clean dressing using gauze sponges, not tissues or cotton balls. Do not scrub or use excessive force. Pat dry using gauze sponges, not tissue or cotton balls. Primary Dressing: Prisma 4.34 (in) Every Other Day/15 Days Discharge Instructions: Moisten w/normal saline or sterile water; Cover wound as directed. Do not  remove from wound bed. Secondary Dressing: [place] Dressing, 4x4 (in/in) (Generic) Every Other Day/15 Days Discharge Instructions: Apply over dressing to secure in place. 1. In office sharp debridement 2. Collagen 3. Follow-up in 1 week Electronic Signature(s) Signed: 06/06/2021 12:53:18 PM By: Kalman Shan DO Entered By: Kalman Shan on 06/06/2021 12:52:12 JELISHA, WEED ([id number]) -------------------------------------------------------------------------------- ROS/PFSH Details [name] Date of Service: 06/06/2021 11:30 AM Medical Record Number: [id number] Patient Account Number: [id number] Date of Birth/Sex: [birth year] (75 y.o. F) Treating RN: Cornell Barman Primary Care Provider: [facility], [place] Other Clinician: Referring  Provider: [facility] Treating Provider/Extender: Yaakov Guthrie in Treatment: 3 Information Obtained From Patient Eyes Medical History: Negative for: Cataracts; Glaucoma; Optic Neuritis Ear/Nose/Mouth/Throat Medical History: Negative for: Chronic sinus problems/congestion; Middle ear problems Hematologic/Lymphatic Medical History: Negative for: Anemia; Hemophilia; Human Immunodeficiency Virus; Lymphedema; Sickle Cell Disease Respiratory Medical History: Positive for: Chronic Obstructive Pulmonary Disease (COPD) Negative for: Aspiration; Asthma; Pneumothorax; Sleep Apnea; Tuberculosis Cardiovascular Medical History: Positive for: Hypertension Negative for: Angina; Arrhythmia; Congestive Heart Failure; Coronary Artery Disease; Deep Vein Thrombosis; Hypotension; Myocardial Infarction; Peripheral Arterial Disease; Peripheral Venous Disease; Phlebitis; Vasculitis Past Medical History Notes: hx SVT - followed by Dr Ubaldo Glassing Gastrointestinal Medical History: Negative for: Cirrhosis ; Colitis; Crohnos; Hepatitis A; Hepatitis B; Hepatitis C Endocrine Medical History: Positive for: Type II Diabetes Negative for: Type I Diabetes Time with diabetes: 10 years Treated with: Oral agents Immunological Medical History: Negative for: Lupus Erythematosus; Raynaudos; Scleroderma Integumentary (Skin) Medical History: Negative for: History of Burn; History of pressure wounds Musculoskeletal Parmenter, Carmie C. ([id number]) Medical History: Negative for: Gout; Rheumatoid Arthritis; Osteoarthritis; Osteomyelitis Neurologic Medical History: Negative for: Dementia; Neuropathy; Quadriplegia; Paraplegia; Seizure Disorder Oncologic Medical History: Negative for: Received Chemotherapy; Received Radiation Immunizations Pneumococcal Vaccine: Received Pneumococcal Vaccination: Yes Received Pneumococcal Vaccination On or After 60th Birthday: Yes Immunization Notes: up to  date Implantable Devices No devices added Family and Social History Former smoker; Marital Status - Married; Alcohol Use: Never; Drug Use: No History; Caffeine Use: Daily; Financial Concerns: No; Food, Clothing or Shelter Needs: No; Support System Lacking: No; Transportation Concerns: No Electronic Signature(s) Signed: 06/06/2021 12:53:18 PM By: Kalman Shan DO Signed: 06/06/2021 3:24:52 PM By: Gretta Cool, BSN, RN, CWS, Kim RN, BSN Entered By: Kalman Shan on 06/06/2021 12:49:01 MCKELL, RIECKE ([id number]) -------------------------------------------------------------------------------- [place] Details [name] Date of Service: 06/06/2021 Medical Record Number: [id number] Patient Account Number: [id number] Date of Birth/Sex: Aug 15, 1945 (75 y.o. F) Treating RN: Donnamarie Poag Primary Care Provider: [facility] Other Clinician: Referring Provider: [facility] Treating Provider/Extender: Yaakov Guthrie in Treatment: 3 Diagnosis Coding ICD-10 Codes Code Description [id number] Non-pressure chronic ulcer of other part of left lower leg with fat layer exposed E11.622 Type 2 diabetes mellitus with other skin ulcer Facility Procedures CPT4 Code: [id number] Description: 19379 - DEB SUBQ TISSUE 20 SQ CM/< Modifier: Quantity: 1 CPT4 Code: Description: ICD-10 Diagnosis Description L97.822 Non-pressure chronic ulcer of other part of left lower leg with fat layer E11.622 Type 2 diabetes mellitus with other skin ulcer Modifier: exposed Quantity: Physician Procedures CPT4 Code: [id number] Description: 11042 - WC PHYS SUBQ TISS 20 SQ CM Modifier: Quantity: 1 CPT4 Code: Description: ICD-10 Diagnosis Description L97.822 Non-pressure chronic ulcer of other part of left lower leg with fat layer E11.622 Type 2 diabetes mellitus with other skin ulcer Modifier: exposed Quantity: Electronic Signature(s) Signed: 06/06/2021 12:53:18 PM By: Kalman Shan  DO Entered By: Kalman Shan on 06/06/2021 12:52:52

## 2021-06-06 NOTE — Progress Notes (Signed)
ALEXAH, KIVETT ([id number]) Visit Report for 06/06/2021 Arrival Information Details [name], [name] Date of Service: 06/06/2021 11:30 AM Medical Record Number: [id number] Patient Account Number: [id number] Date of Birth/Sex: 05/26/1946 (75 y.o. F) Treating RN: Donnamarie Poag Primary Care Tajai Suder: [facility] Other Clinician: Referring Ashli Selders: [facility] Treating Tashya Alberty/Extender: Yaakov Guthrie in Treatment: 3 Visit Information History Since Last Visit Added or deleted any medications: No Patient Arrived: Ambulatory Had a fall or experienced change in No Arrival Time: 12:08 activities of daily living that may affect Accompanied By: self risk of falls: Transfer Assistance: None Hospitalized since last visit: No Patient Identification Verified: Yes Has Dressing in Place as Prescribed: Yes Secondary Verification Process Completed: Yes Pain Present Now: No Electronic Signature(s) Signed: 06/06/2021 3:34:54 PM By: Donnamarie Poag Entered By: Donnamarie Poag on 06/06/2021 12:17:03 [name] ([id number]) -------------------------------------------------------------------------------- Clinic Level of Care Assessment Details [name] Date of Service: 06/06/2021 11:30 AM Medical Record Number: [id number] Patient Account Number: [id number] Date of Birth/Sex: 04/29/1946 (75 y.o. F) Treating RN: Donnamarie Poag Primary Care Leon Montoya: [facility] Other Clinician: Referring Selisa Tensley: [facility] Treating Yoseline Andersson/Extender: Yaakov Guthrie in Treatment: 3 Clinic Level of Care Assessment Items TOOL 1 Quantity Score _0  - Use when EandM and Procedure is performed on INITIAL visit 0 ASSESSMENTS - Nursing Assessment / Reassessment _1  - General Physical Exam (combine w/ comprehensive assessment (listed just below) when performed on new 0 pt. evals) _2  - 0 Comprehensive Assessment (HX, ROS, Risk Assessments,  Wounds Hx, etc.) ASSESSMENTS - Wound and Skin Assessment / Reassessment _3  - Dermatologic / Skin Assessment (not related to wound area) 0 ASSESSMENTS - Ostomy and/or Continence Assessment and Care _4  - Incontinence Assessment and Management 0 _5  - 0 Ostomy Care Assessment and Management (repouching, etc.) PROCESS - Coordination of Care _6  - Simple Patient / Family Education for ongoing care 0 _7  - 0 Complex (extensive) Patient / Family Education for ongoing care _8  - 0 Staff obtains Programmer, systems, Records, Test Results / Process Orders _9  - 0 Staff telephones HHA, Nursing Homes / Clarify orders / etc _10  - 0 Routine Transfer to another Facility (non-emergent condition) _11  - 0 Routine Hospital Admission (non-emergent condition) _12  - 0 New Admissions / Biomedical engineer / Ordering NPWT, Apligraf, etc. _13  - 0 Emergency Hospital Admission (emergent condition) PROCESS - Special Needs _14  - Pediatric / Minor Patient Management 0 _15  - 0 Isolation Patient Management _16  - 0 Hearing / Language / Visual special needs _17  - 0 Assessment of Community assistance (transportation, D/C planning, etc.) _18  - 0 Additional assistance / Altered mentation _19  - 0 Support Surface(s) Assessment (bed, cushion, seat, etc.) INTERVENTIONS - Miscellaneous _20  - External ear exam 0 _21  - 0 Patient Transfer (multiple staff / Civil Service fast streamer / Similar devices) _22  - 0 Simple Staple / Suture removal (25 or less) _23  - 0 Complex Staple / Suture removal (26 or more) _24  - 0 Hypo/Hyperglycemic Management (do not check if billed separately) _25  - 0 Ankle / Brachial Index (ABI) - do not check if billed separately Has the patient been seen at the hospital within the last three years: Yes Total Score: 0 Level Of Care: ____ [name] ([id number]) Electronic Signature(s) Signed: 06/06/2021 3:34:54 PM By: Donnamarie Poag Entered By: Donnamarie Poag on 06/06/2021 12:26:00 [name]  ([id number]) -------------------------------------------------------------------------------- Encounter Discharge Information Details [name] Date of Service: 06/06/2021 11:30 AM Medical Record Number: [id number] Patient Account  Number: [id number] Date of Birth/Sex: 08-19-1945 (75 y.o. F) Treating RN: Donnamarie Poag Primary Care Lynnita Somma: [facility] Other Clinician: Referring Wendal Wilkie: [facility] Treating Janayia Burggraf/Extender: Yaakov Guthrie in Treatment: 3 Encounter Discharge Information Items Post Procedure Vitals Discharge Condition: Stable Temperature (F): 98.0 Ambulatory Status: Ambulatory Pulse (bpm): 80 Discharge Destination: Home Respiratory Rate (breaths/min): 16 Transportation: Private Auto Blood Pressure (mmHg): 145/83 Accompanied By: self Schedule Follow-up Appointment: Yes Clinical Summary of Care: Electronic Signature(s) Signed: 06/06/2021 3:34:54 PM By: Donnamarie Poag Entered By: Donnamarie Poag on 06/06/2021 12:27:01 [name] ([id number]) -------------------------------------------------------------------------------- Lower Extremity Assessment Details [name] Date of Service: 06/06/2021 11:30 AM Medical Record Number: [id number] Patient Account Number: [id number] Date of Birth/Sex: 14-Feb-1946 (75 y.o. F) Treating RN: Donnamarie Poag Primary Care Zianna Dercole: [facility] Other Clinician: Referring Iyauna Sing: [facility] Treating Jewelz Ricklefs/Extender: Kalman Shan Weeks in Treatment: 3 Edema Assessment Assessed: [Left: Yes] [Right: No] Edema: [Left: N] [Right: o] Vascular Assessment Pulses: Dorsalis Pedis Palpable: [Left:Yes] Electronic Signature(s) Signed: 06/06/2021 3:34:54 PM By: Donnamarie Poag Entered By: Donnamarie Poag on 06/06/2021 12:20:06 [name] ([id number]) -------------------------------------------------------------------------------- Multi Wound Chart  Details [name] Date of Service: 06/06/2021 11:30 AM Medical Record Number: [id number] Patient Account Number: [id number] Date of Birth/Sex: 1946-01-07 (75 y.o. F) Treating RN: Donnamarie Poag Primary Care Kourtlynn Trevor: [facility] Other Clinician: Referring Dareion Kneece: [facility] Treating Alvie Speltz/Extender: Yaakov Guthrie in Treatment: 3 Vital Signs Height(in): 40 Pulse(bpm): 80 Weight(lbs): 163 Blood Pressure(mmHg): 145/83 Body Mass Index(BMI): 26 Temperature(F): 98 Respiratory Rate(breaths/min): 16 Photos: [N/A:N/A] Wound Location: Left Knee N/A N/A Wounding Event: Hematoma N/A N/A Primary Etiology: Trauma, Other N/A N/A Comorbid History: Chronic Obstructive Pulmonary N/A N/A Disease (COPD), Hypertension, Type II Diabetes Date Acquired: 04/13/2021 N/A N/A Weeks of Treatment: 3 N/A N/A Wound Status: Open N/A N/A Clustered Wound: Yes N/A N/A Measurements L x W x D (cm) 0.4x0.4x0.3 N/A N/A Area (cm) : 0.126 N/A N/A Volume (cm) : 0.038 N/A N/A % Reduction in Area: 71.40% N/A N/A % Reduction in Volume: 94.60% N/A N/A Starting Position 1 (o'clock): 12 Ending Position 1 (o'clock): 12 Maximum Distance 1 (cm): 0.3 Undermining: Yes N/A N/A Classification: Full Thickness Without Exposed N/A N/A Support Structures Exudate Amount: Large N/A N/A Exudate Type: Serosanguineous N/A N/A Exudate Color: red, brown N/A N/A Granulation Amount: Large (67-100%) N/A N/A Granulation Quality: Red, Pink N/A N/A Necrotic Amount: Small (1-33%) N/A N/A Exposed Structures: Fat Layer (Subcutaneous Tissue): N/A N/A Yes Fascia: No Tendon: No Muscle: No Joint: No Bone: No Epithelialization: None N/A N/A Debridement: Debridement - Excisional N/A N/A Pre-procedure Verification/Time 12:15 N/A N/A Out Taken: Pain Control: Lidocaine N/A N/A Tissue Debrided: Subcutaneous, Slough N/A N/A Level: Skin/Subcutaneous Tissue N/A N/A Debridement Area (sq cm):  0.16 N/A N/A [name], [name] ([id number]) Instrument: Curette N/A N/A Bleeding: Minimum N/A N/A Hemostasis Achieved: Pressure N/A N/A Debridement Treatment Procedure was tolerated well N/A N/A Response: Post Debridement 0.4x0.4x0.3 N/A N/A Measurements L x W x D (cm) Post Debridement Volume: 0.038 N/A N/A (cm) Procedures Performed: Debridement N/A N/A Treatment Notes Wound #4 (Knee) Wound Laterality: Left Cleanser Byram Ancillary Kit - 15 Day Supply Discharge Instruction: Use supplies as instructed; Kit contains: (15) Saline Bullets; (15) 3x3 Gauze; 15 pr Gloves Normal Saline Discharge Instruction: Wash your hands with soap and water. Remove old dressing, discard into plastic bag and place into trash. Cleanse the wound with Normal Saline prior to applying a clean dressing using gauze  sponges, not tissues or cotton balls. Do not scrub or use excessive force. Pat dry using gauze sponges, not tissue or cotton balls. Peri-Wound Care Topical Primary Dressing Prisma 4.34 (in) Discharge Instruction: Moisten w/normal saline or sterile water; Cover wound as directed. Do not remove from wound bed. Secondary Dressing Telfa Adhesive Island Dressing, 4x4 (in/in) Discharge Instruction: Apply over dressing to secure in place. Secured With Compression Wrap Compression Stockings Add-Ons Electronic Signature(s) Signed: 06/06/2021 12:53:18 PM By: Kalman Shan DO Entered By: Kalman Shan on 06/06/2021 12:48:22 [name], [name] ([id number]) -------------------------------------------------------------------------------- Multi-Disciplinary Care Plan Details [name] Date of Service: 06/06/2021 11:30 AM Medical Record Number: [id number] Patient Account Number: [id number] Date of Birth/Sex: [birth year] (75 y.o. F) Treating RN: Donnamarie Poag Primary Care Aracelis Ulrey: [facility] Other Clinician: Referring Olive Motyka: [facility] Treating Mekel Haverstock/Extender:  Yaakov Guthrie in Treatment: 3 Active Inactive Wound/Skin Impairment Nursing Diagnoses: Impaired tissue integrity Knowledge deficit related to smoking impact on wound healing Knowledge deficit related to ulceration/compromised skin integrity Goals: Patient will demonstrate a reduced rate of smoking or cessation of smoking Date Initiated: 05/16/2021 Target Resolution Date: 05/16/2021 Goal Status: Active Ulcer/skin breakdown will have a volume reduction of 30% by week 4 Date Initiated: 05/16/2021 Target Resolution Date: 06/15/2021 Goal Status: Active Interventions: Assess ulceration(s) every visit Treatment Activities: Skin care regimen initiated : 05/16/2021 Topical wound management initiated : 05/16/2021 Notes: Electronic Signature(s) Signed: 06/06/2021 3:34:54 PM By: Donnamarie Poag Entered By: Donnamarie Poag on 06/06/2021 12:23:06 [name] ([id number]) -------------------------------------------------------------------------------- Pain Assessment Details [name] Date of Service: 06/06/2021 11:30 AM Medical Record Number: [id number] Patient Account Number: [id number] Date of Birth/Sex: [birth year] (75 y.o. F) Treating RN: Donnamarie Poag Primary Care Hazelle Woollard: [facility] Other Clinician: Referring Shae Hinnenkamp: [facility] Treating Lenzie Sandler/Extender: Yaakov Guthrie in Treatment: 3 Active Problems Location of Pain Severity and Description of Pain Patient Has Paino No Site Locations Rate the pain. Current Pain Level: 0 Pain Management and Medication Current Pain Management: Electronic Signature(s) Signed: 06/06/2021 3:34:54 PM By: Donnamarie Poag Entered By: Donnamarie Poag on 06/06/2021 12:18:26 [name] ([id number]) -------------------------------------------------------------------------------- Patient/Caregiver Education Details [name] Date of Service: 06/06/2021 11:30 AM Medical Record Number:  [id number] Patient Account Number: [id number] Date of Birth/Gender: 07-Jun-1946 (75 y.o. F) Treating RN: Donnamarie Poag Primary Care Physician: [facility] Other Clinician: Referring Physician: [facility] Treating Physician/Extender: Yaakov Guthrie in Treatment: 3 Education Assessment Education Provided To: Patient Education Topics Provided Basic Hygiene: Wound Debridement: Wound/Skin Impairment: Electronic Signature(s) Signed: 06/06/2021 3:34:54 PM By: Donnamarie Poag Entered By: Donnamarie Poag on 06/06/2021 12:26:18 [name] ([id number]) -------------------------------------------------------------------------------- Wound Assessment Details [name] Date of Service: 06/06/2021 11:30 AM Medical Record Number: [id number] Patient Account Number: [id number] Date of Birth/Sex: [birth year] (75 y.o. F) Treating RN: Donnamarie Poag Primary Care Kapil Petropoulos: [facility] Other Clinician: Referring Mercia Dowe: [facility] Treating Gracie Gupta/Extender: Yaakov Guthrie in Treatment: 3 Wound Status Wound Number: 4 Primary Trauma, Other Etiology: Wound Location: Left Knee Wound Status: Open Wounding Event: Hematoma Comorbid Chronic Obstructive Pulmonary Disease (COPD), Date Acquired: 04/13/2021 History: Hypertension, Type II Diabetes Weeks Of Treatment: 3 Clustered Wound: Yes Photos Wound Measurements Length: (cm) 0.4 Width: (cm) 0.4 Depth: (cm) 0.3 Area: (cm) 0.126 Volume: (cm) 0.038 % Reduction in Area: 71.4% % Reduction in Volume: 94.6% Epithelialization: None Tunneling: No Undermining: Yes Starting Position (o'clock): 12 Ending Position (o'clock): 12 Maximum Distance: (cm) 0.3 Wound Description  Classification: Full Thickness Without Exposed Support Structures Exudate Amount: Large Exudate Type: Serosanguineous Exudate Color: red, brown Foul Odor After Cleansing: No Slough/Fibrino Yes Wound Bed Granulation  Amount: Large (67-100%) Exposed Structure Granulation Quality: Red, Pink Fascia Exposed: No Necrotic Amount: Small (1-33%) Fat Layer (Subcutaneous Tissue) Exposed: Yes Necrotic Quality: Adherent Slough Tendon Exposed: No Muscle Exposed: No Joint Exposed: No Bone Exposed: No Treatment Notes Wound #4 (Knee) Wound Laterality: Left Cleanser Byram Ancillary Kit - [address] [name], [name] ([id number]) Discharge Instruction: Use supplies as instructed; Kit contains: (15) Saline Bullets; (15) 3x3 Gauze; 15 pr Gloves Normal Saline Discharge Instruction: Wash your hands with soap and water. Remove old dressing, discard into plastic bag and place into trash. Cleanse the wound with Normal Saline prior to applying a clean dressing using gauze sponges, not tissues or cotton balls. Do not scrub or use excessive force. Pat dry using gauze sponges, not tissue or cotton balls. Peri-Wound Care Topical Primary Dressing Prisma 4.34 (in) Discharge Instruction: Moisten w/normal saline or sterile water; Cover wound as directed. Do not remove from wound bed. Secondary Dressing Telfa Adhesive Island Dressing, 4x4 (in/in) Discharge Instruction: Apply over dressing to secure in place. Secured With Compression Wrap Compression Stockings Add-Ons Electronic Signature(s) Signed: 06/06/2021 3:34:54 PM By: Donnamarie Poag Entered By: Donnamarie Poag on 06/06/2021 12:19:48 [name], [name] ([id number]) -------------------------------------------------------------------------------- Vitals Details [name] Date of Service: 06/06/2021 11:30 AM Medical Record Number: [id number] Patient Account Number: [id number] Date of Birth/Sex: 04-14-1946 (75 y.o. F) Treating RN: Donnamarie Poag Primary Care Anthon Harpole: [facility] Other Clinician: Referring Daruis Swaim: [facility] Treating Jerrelle Michelsen/Extender: Yaakov Guthrie in Treatment: 3 Vital Signs Time Taken: 12:15 Temperature  (F): 98 Height (in): 67 Pulse (bpm): 80 Weight (lbs): 163 Respiratory Rate (breaths/min): 16 Body Mass Index (BMI): 25.5 Blood Pressure (mmHg): 145/83 Reference Range: 80 - 120 mg / dl Electronic Signature(s) Signed: 06/06/2021 3:34:54 PM By: Donnamarie Poag Entered ByDonnamarie Poag on 06/06/2021 12:18:18

## 2021-06-13 ENCOUNTER — Other Ambulatory Visit: Payer: Self-pay

## 2021-06-13 ENCOUNTER — Encounter (HOSPITAL_BASED_OUTPATIENT_CLINIC_OR_DEPARTMENT_OTHER): Payer: Medicare Other | Admitting: Internal Medicine

## 2021-06-13 DIAGNOSIS — E11622 Type 2 diabetes mellitus with other skin ulcer: Secondary | ICD-10-CM | POA: Diagnosis not present

## 2021-06-13 DIAGNOSIS — L97822 Non-pressure chronic ulcer of other part of left lower leg with fat layer exposed: Secondary | ICD-10-CM | POA: Diagnosis not present

## 2021-06-13 NOTE — Progress Notes (Signed)
Janet, [name] ([id number]) Visit Report for 06/13/2021 Chief Complaint Document Details [name], [name] Date of Service: 06/13/2021 2:15 PM Medical Record Number: [id number] Patient Account Number: [id number] Date of Birth/Sex: 08-Nov-1945 (75 y.o. F) Treating RN: Carlene Coria Primary Care Provider: [facility] Other Clinician: Referring Provider: [facility] Treating Provider/Extender: Yaakov Guthrie in Treatment: 4 Information Obtained from: Patient Chief Complaint Patient presents for a left knee wound status post fall Electronic Signature(s) Signed: 06/13/2021 2:54:14 PM By: Kalman Shan DO Entered By: Kalman Shan on 06/13/2021 14:48:03 [name] ([id number]) -------------------------------------------------------------------------------- HPI Details [name] Date of Service: 06/13/2021 2:15 PM Medical Record Number: [id number] Patient Account Number: [id number] Date of Birth/Sex: Dec 30, 1945 (75 y.o. F) Treating RN: Carlene Coria Primary Care Provider: [facility] Other Clinician: Referring Provider: [facility] Treating Provider/Extender: Yaakov Guthrie in Treatment: 4 History of Present Illness Location: right lower extremity in the area of the lateral calf below the knee Quality: She is having no pain Context: The wound occurred when the patient a blunt injury and fall injury Modifying Factors: None as patient was healed Associated Signs and Symptoms: Patient reports having increase discharge. HPI Description: Admission 05/16/2021 Ms. [name] is a 75 year old female with a past medical history of type 2 diabetes controlled on metformin and essential hypertension that presents to the clinic for a right knee wound. She states that 6 weeks ago she fell tripping over her ottoman onto her left knee. She states that she put ice on the area and has not used any other  treatment modalities. She presents today with scabs on her knee. She states she actually did not want to follow-up but her son recommended it. She has not seen any other healthcare provider for this issue. She has not been on antibiotics for this. She currently denies pain or signs of infection. 11/9; patient presents for 1 week follow-up. She has been using iodoform packing to the wound bed without issues. She denies signs of infection. She has no issues or complaints today. 11/16; patient presents for 1 week follow-up. She has been using iodoform packing to the wound bed. She has no issues or complaints today. She denies signs of infection. 11/23; patient presents for 1 week follow-up. She has been using collagen to the wound bed. She reports improvement in wound healing. She denies signs of infection. 11/30; patient presents for 1 week follow-up. She has been using collagen to the wound bed. She has no issues or complaints today. Electronic Signature(s) Signed: 06/13/2021 2:54:14 PM By: Kalman Shan DO Entered By: Kalman Shan on 06/13/2021 14:48:26 [name] ([id number]) -------------------------------------------------------------------------------- Physical Exam Details [name] Date of Service: 06/13/2021 2:15 PM Medical Record Number: [id number] Patient Account Number: [id number] Date of Birth/Sex: Jul 04, 1946 (75 y.o. F) Treating RN: Carlene Coria Primary Care Provider: [facility] Other Clinician: Referring Provider: [facility] Treating Provider/Extender: Yaakov Guthrie in Treatment: 4 Constitutional . Psychiatric . Notes Left knee with granulation tissue to the open wound. Minimal undermining to the 10:00 and 3 o'clock position. Overall appears well-healing. No signs of infection. No drainage noted. Electronic Signature(s) Signed: 06/13/2021 2:54:14 PM By: Kalman Shan DO Entered By: Kalman Shan on  06/13/2021 14:49:31 [name] ([id number]) -------------------------------------------------------------------------------- Physician Orders Details [name] Date of Service: 06/13/2021 2:15 PM Medical Record Number: [id number] Patient Account Number: [id number] Date of Birth/Sex: 1946/05/25 (75 y.o. F) Treating RN: Epps,  Morey Hummingbird Primary Care Provider: [facility], [place] Other Clinician: Referring Provider: [facility], [place] Treating Provider/Extender: Yaakov Guthrie in Treatment: 4 Verbal / Phone Orders: No Diagnosis Coding Follow-up Appointments o Return Appointment in 2 weeks. o Nurse Visit as needed Bathing/ Shower/ Hygiene o Clean wound with Normal Saline or wound cleanser. o May shower; gently cleanse wound with antibacterial soap, rinse and pat dry prior to dressing wounds - change dressing after shower/keep it dry o No tub bath. Edema Control - Lymphedema / Segmental Compressive Device / Other o Elevate leg(s) parallel to the floor when sitting. o DO YOUR BEST to sleep in the bed at night. DO NOT sleep in your recliner. Long hours of sitting in a recliner leads to swelling of the legs and/or potential wounds on your backside. Additional Orders / Instructions o Follow Nutritious Diet and Increase Protein Intake Wound Treatment Wound #4 - Knee Wound Laterality: Left Cleanser: Byram Ancillary Kit - 15 Day Supply (Generic) Every Other Day/15 Days Discharge Instructions: Use supplies as instructed; Kit contains: (15) Saline Bullets; (15) 3x3 Gauze; 15 pr Gloves Cleanser: Normal Saline (Generic) Every Other Day/15 Days Discharge Instructions: Wash your hands with soap and water. Remove old dressing, discard into plastic bag and place into trash. Cleanse the wound with Normal Saline prior to applying a clean dressing using gauze sponges, not tissues or cotton balls. Do not scrub or use excessive force. Pat dry using gauze  sponges, not tissue or cotton balls. Primary Dressing: Prisma 4.34 (in) Every Other Day/15 Days Discharge Instructions: Moisten w/normal saline or sterile water; Cover wound as directed. Do not remove from wound bed. Secondary Dressing: [place] Dressing, 4x4 (in/in) (Generic) Every Other Day/15 Days Discharge Instructions: Apply over dressing to secure in place. Electronic Signature(s) Signed: 06/13/2021 2:54:14 PM By: Kalman Shan DO Previous Signature: 06/13/2021 2:51:09 PM Version By: Carlene Coria RN Entered By: Kalman Shan on 06/13/2021 14:53:00 [name] ([id number]) -------------------------------------------------------------------------------- Problem List Details [name] Date of Service: 06/13/2021 2:15 PM Medical Record Number: [id number] Patient Account Number: [id number] Date of Birth/Sex: Dec 20, 1945 (75 y.o. F) Treating RN: Carlene Coria Primary Care Provider: [facility] Other Clinician: Referring Provider: [facility] Treating Provider/Extender: Yaakov Guthrie in Treatment: 4 Active Problems ICD-10 Encounter Code Description Active Date MDM Diagnosis L97.822 Non-pressure chronic ulcer of other part of left lower leg with fat layer 05/16/2021 No Yes exposed E11.622 Type 2 diabetes mellitus with other skin ulcer 05/16/2021 No Yes Inactive Problems Resolved Problems Electronic Signature(s) Signed: 06/13/2021 2:54:14 PM By: Kalman Shan DO Entered By: Kalman Shan on 06/13/2021 14:47:45 [name] ([id number]) -------------------------------------------------------------------------------- Progress Note Details [name] Date of Service: 06/13/2021 2:15 PM Medical Record Number: [id number] Patient Account Number: [id number] Date of Birth/Sex: 27-Mar-1946 (75 y.o. F) Treating RN: Carlene Coria Primary Care Provider: [facility] Other Clinician: Referring  Provider: [facility] Treating Provider/Extender: Yaakov Guthrie in Treatment: 4 Subjective Chief Complaint Information obtained from Patient Patient presents for a left knee wound status post fall History of Present Illness (HPI) The following HPI elements were documented for the patient's wound: Location: right lower extremity in the area of the lateral calf below the knee Quality: She is having no pain Context: The wound occurred when the patient a blunt injury and fall injury Modifying Factors: None as patient was healed Associated Signs and Symptoms: Patient reports having increase discharge. Admission 05/16/2021 Ms. Nkenge Sonntag is a 75 year old female  with a past medical history of type 2 diabetes controlled on metformin and essential hypertension that presents to the clinic for a right knee wound. She states that 6 weeks ago she fell tripping over her ottoman onto her left knee. She states that she put ice on the area and has not used any other treatment modalities. She presents today with scabs on her knee. She states she actually did not want to follow-up but her son recommended it. She has not seen any other healthcare provider for this issue. She has not been on antibiotics for this. She currently denies pain or signs of infection. 11/9; patient presents for 1 week follow-up. She has been using iodoform packing to the wound bed without issues. She denies signs of infection. She has no issues or complaints today. 11/16; patient presents for 1 week follow-up. She has been using iodoform packing to the wound bed. She has no issues or complaints today. She denies signs of infection. 11/23; patient presents for 1 week follow-up. She has been using collagen to the wound bed. She reports improvement in wound healing. She denies signs of infection. 11/30; patient presents for 1 week follow-up. She has been using collagen to the wound bed. She has no issues or complaints  today. Patient History Information obtained from Patient. Social History Former smoker, Marital Status - Married, Alcohol Use - Never, Drug Use - No History, Caffeine Use - Daily. Medical History Eyes Denies history of Cataracts, Glaucoma, Optic Neuritis Ear/Nose/Mouth/Throat Denies history of Chronic sinus problems/congestion, Middle ear problems Hematologic/Lymphatic Denies history of Anemia, Hemophilia, Human Immunodeficiency Virus, Lymphedema, Sickle Cell Disease Respiratory Patient has history of Chronic Obstructive Pulmonary Disease (COPD) Denies history of Aspiration, Asthma, Pneumothorax, Sleep Apnea, Tuberculosis Cardiovascular Patient has history of Hypertension Denies history of Angina, Arrhythmia, Congestive Heart Failure, Coronary Artery Disease, Deep Vein Thrombosis, Hypotension, Myocardial Infarction, Peripheral Arterial Disease, Peripheral Venous Disease, Phlebitis, Vasculitis Gastrointestinal Denies history of Cirrhosis , Colitis, Crohn s, Hepatitis A, Hepatitis B, Hepatitis C Endocrine Patient has history of Type II Diabetes Denies history of Type I Diabetes Immunological Denies history of Lupus Erythematosus, Raynaud s, Scleroderma Integumentary (Skin) Denies history of History of Burn, History of pressure wounds Musculoskeletal Denies history of Gout, Rheumatoid Arthritis, Osteoarthritis, Osteomyelitis Neurologic Denies history of Dementia, Neuropathy, Quadriplegia, Paraplegia, Seizure Disorder Oncologic EDIN, SKARDA ([id number]) Denies history of Received Chemotherapy, Received Radiation Medical And Surgical History Notes Cardiovascular hx SVT - followed by Dr Ubaldo Glassing Objective Constitutional Vitals Time Taken: 2:24 PM, Height: 67 in, Weight: 163 lbs, BMI: 25.5, Temperature: 97.8 F, Pulse: 83 bpm, Respiratory Rate: 18 breaths/min, Blood Pressure: 130/79 mmHg. General Notes: Left knee with granulation tissue to the open wound. Minimal undermining to the  10:00 and 3 o'clock position. Overall appears well-healing. No signs of infection. No drainage noted. Integumentary (Hair, Skin) Wound #4 status is Open. Original cause of wound was Hematoma. The date acquired was: 04/13/2021. The wound has been in treatment 4 weeks. The wound is located on the Left Knee. The wound measures 0.5cm length x 0.5cm width x 0.2cm depth; 0.196cm^2 area and 0.039cm^3 volume. There is Fat Layer (Subcutaneous Tissue) exposed. There is no tunneling or undermining noted. There is a medium amount of serosanguineous drainage noted. There is large (67-100%) red, pink granulation within the wound bed. There is a small (1-33%) amount of necrotic tissue within the wound bed including Adherent Slough. Assessment Active Problems ICD-10 Non-pressure chronic ulcer of other part of left lower leg with fat layer  exposed Type 2 diabetes mellitus with other skin ulcer Patient's wound has shown improvement in appearance since last clinic visit. No signs of infection on exam. The wound appears well healing with healthy granulation tissue. I recommended continuing collagen. I will see her in 2 weeks and I am hopeful it will be closed by then. Plan Follow-up Appointments: Return Appointment in 2 weeks. Nurse Visit as needed Bathing/ Shower/ Hygiene: Clean wound with Normal Saline or wound cleanser. May shower; gently cleanse wound with antibacterial soap, rinse and pat dry prior to dressing wounds - change dressing after shower/keep it dry No tub bath. Edema Control - Lymphedema / Segmental Compressive Device / Other: Elevate leg(s) parallel to the floor when sitting. DO YOUR BEST to sleep in the bed at night. DO NOT sleep in your recliner. Long hours of sitting in a recliner leads to swelling of the legs and/or potential wounds on your backside. Additional Orders / Instructions: Follow Nutritious Diet and Increase Protein Intake WOUND #4: - Knee Wound Laterality: Left Cleanser:  Byram Ancillary Kit - 15 Day Supply (Generic) Every Other Day/15 Days Discharge Instructions: Use supplies as instructed; Kit contains: (15) Saline Bullets; (15) 3x3 Gauze; 15 pr Gloves Cleanser: Normal Saline (Generic) Every Other Day/15 Days Discharge Instructions: Wash your hands with soap and water. Remove old dressing, discard into plastic bag and place into trash. Cleanse the wound with Normal Saline prior to applying a clean dressing using gauze sponges, not tissues or cotton balls. Do not scrub or use excessive force. Pat dry using gauze sponges, not tissue or cotton balls. JORDANA, DUGUE ([id number]) Primary Dressing: Prisma 4.34 (in) Every Other Day/15 Days Discharge Instructions: Moisten w/normal saline or sterile water; Cover wound as directed. Do not remove from wound bed. Secondary Dressing: [place] Dressing, 4x4 (in/in) (Generic) Every Other Day/15 Days Discharge Instructions: Apply over dressing to secure in place. 1. Collagen 2. Follow-up in 2 weeks Electronic Signature(s) Signed: 06/13/2021 2:54:14 PM By: Kalman Shan DO Entered By: Kalman Shan on 06/13/2021 14:53:52 LANDRIE, BEALE ([id number]) -------------------------------------------------------------------------------- ROS/PFSH Details [name] Date of Service: 06/13/2021 2:15 PM Medical Record Number: [id number] Patient Account Number: [id number] Date of Birth/Sex: 20-Oct-1945 (75 y.o. F) Treating RN: Carlene Coria Primary Care Provider: [facility] Other Clinician: Referring Provider: [facility] Treating Provider/Extender: Yaakov Guthrie in Treatment: 4 Information Obtained From Patient Eyes Medical History: Negative for: Cataracts; Glaucoma; Optic Neuritis Ear/Nose/Mouth/Throat Medical History: Negative for: Chronic sinus problems/congestion; Middle ear problems Hematologic/Lymphatic Medical History: Negative for: Anemia; Hemophilia;  Human Immunodeficiency Virus; Lymphedema; Sickle Cell Disease Respiratory Medical History: Positive for: Chronic Obstructive Pulmonary Disease (COPD) Negative for: Aspiration; Asthma; Pneumothorax; Sleep Apnea; Tuberculosis Cardiovascular Medical History: Positive for: Hypertension Negative for: Angina; Arrhythmia; Congestive Heart Failure; Coronary Artery Disease; Deep Vein Thrombosis; Hypotension; Myocardial Infarction; Peripheral Arterial Disease; Peripheral Venous Disease; Phlebitis; Vasculitis Past Medical History Notes: hx SVT - followed by Dr Ubaldo Glassing Gastrointestinal Medical History: Negative for: Cirrhosis ; Colitis; Crohnos; Hepatitis A; Hepatitis B; Hepatitis C Endocrine Medical History: Positive for: Type II Diabetes Negative for: Type I Diabetes Time with diabetes: 10 years Treated with: Oral agents Immunological Medical History: Negative for: Lupus Erythematosus; Raynaudos; Scleroderma Integumentary (Skin) Medical History: Negative for: History of Burn; History of pressure wounds Musculoskeletal Moskowitz, Alyrica C. ([id number]) Medical History: Negative for: Gout; Rheumatoid Arthritis; Osteoarthritis; Osteomyelitis Neurologic Medical History: Negative for: Dementia; Neuropathy; Quadriplegia; Paraplegia; Seizure Disorder Oncologic Medical History: Negative for: Received Chemotherapy; Received Radiation Immunizations Pneumococcal  Vaccine: Received Pneumococcal Vaccination: Yes Received Pneumococcal Vaccination On or After 60th Birthday: Yes Immunization Notes: up to date Implantable Devices No devices added Family and Social History Former smoker; Marital Status - Married; Alcohol Use: Never; Drug Use: No History; Caffeine Use: Daily; Financial Concerns: No; Food, Clothing or Shelter Needs: No; Support System Lacking: No; Transportation Concerns: No Electronic Signature(s) Signed: 06/13/2021 2:51:09 PM By: Carlene Coria RN Signed: 06/13/2021 2:54:14 PM By:  Kalman Shan DO Entered By: Kalman Shan on 06/13/2021 14:48:34 KORRI, ASK ([id number]) -------------------------------------------------------------------------------- Prien Details [name] Date of Service: 06/13/2021 Medical Record Number: [id number] Patient Account Number: [id number] Date of Birth/Sex: [birth year] (75 y.o. F) Treating RN: Carlene Coria Primary Care Provider: [facility] Other Clinician: Referring Provider: [facility] Treating Provider/Extender: Yaakov Guthrie in Treatment: 4 Diagnosis Coding ICD-10 Codes Code Description [phone] Non-pressure chronic ulcer of other part of left lower leg with fat layer exposed E11.622 Type 2 diabetes mellitus with other skin ulcer Facility Procedures CPT4 Code: [id number] Description: 99213 - WOUND CARE VISIT-LEV 3 EST PT Modifier: Quantity: 1 Physician Procedures CPT4 Code: 8469629 Description: 52841 - WC PHYS LEVEL 3 - EST PT Modifier: Quantity: 1 CPT4 Code: Description: ICD-10 Diagnosis Description L97.822 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

## 2021-06-13 NOTE — Progress Notes (Signed)
ARIELIS, LEONHART ([id number]) Visit Report for 06/13/2021 Arrival Information Details [name], [name] Date of Service: 06/13/2021 2:15 PM Medical Record Number: [id number] Patient Account Number: [id number] Date of Birth/Sex: 10/31/1945 (75 y.o. F) Treating RN: Carlene Coria Primary Care Tayvon Culley: [facility] Other Clinician: Referring Zeniyah Peaster: [facility] Treating Quadre Bristol/Extender: Yaakov Guthrie in Treatment: 4 Visit Information History Since Last Visit All ordered tests and consults were completed: No Patient Arrived: Ambulatory Added or deleted any medications: No Arrival Time: 14:20 Any new allergies or adverse reactions: No Accompanied By: self Had a fall or experienced change in No Transfer Assistance: None activities of daily living that may affect Patient Identification Verified: Yes risk of falls: Secondary Verification Process Completed: Yes Signs or symptoms of abuse/neglect since last visito No Patient Requires Transmission-Based Precautions: No Hospitalized since last visit: No Patient Has Alerts: No Implantable device outside of the clinic excluding No cellular tissue based products placed in the center since last visit: Has Dressing in Place as Prescribed: Yes Pain Present Now: No Electronic Signature(s) Signed: 06/13/2021 2:51:09 PM By: Carlene Coria RN Entered By: Carlene Coria on 06/13/2021 14:24:03 [name] ([id number]) -------------------------------------------------------------------------------- Clinic Level of Care Assessment Details [name] Date of Service: 06/13/2021 2:15 PM Medical Record Number: [id number] Patient Account Number: [id number] Date of Birth/Sex: 08-May-1946 (75 y.o. F) Treating RN: Carlene Coria Primary Care Tressie Ragin: [facility] Other Clinician: Referring Margretta Zamorano: [facility] Treating Florentina Marquart/Extender: Yaakov Guthrie in Treatment:  4 Clinic Level of Care Assessment Items TOOL 4 Quantity Score X - Use when only an EandM is performed on FOLLOW-UP visit 1 0 ASSESSMENTS - Nursing Assessment / Reassessment X - Reassessment of Co-morbidities (includes updates in patient status) 1 10 X- 1 5 Reassessment of Adherence to Treatment Plan ASSESSMENTS - Wound and Skin Assessment / Reassessment X - Simple Wound Assessment / Reassessment - one wound 1 5 []  - 0 Complex Wound Assessment / Reassessment - multiple wounds []  - 0 Dermatologic / Skin Assessment (not related to wound area) ASSESSMENTS - Focused Assessment []  - Circumferential Edema Measurements - multi extremities 0 []  - 0 Nutritional Assessment / Counseling / Intervention []  - 0 Lower Extremity Assessment (monofilament, tuning fork, pulses) []  - 0 Peripheral Arterial Disease Assessment (using hand held doppler) ASSESSMENTS - Ostomy and/or Continence Assessment and Care []  - Incontinence Assessment and Management 0 []  - 0 Ostomy Care Assessment and Management (repouching, etc.) PROCESS - Coordination of Care X - Simple Patient / Family Education for ongoing care 1 15 []  - 0 Complex (extensive) Patient / Family Education for ongoing care []  - 0 Staff obtains Programmer, systems, Records, Test Results / Process Orders []  - 0 Staff telephones HHA, Nursing Homes / Clarify orders / etc []  - 0 Routine Transfer to another Facility (non-emergent condition) []  - 0 Routine Hospital Admission (non-emergent condition) []  - 0 New Admissions / Biomedical engineer / Ordering NPWT, Apligraf, etc. []  - 0 Emergency Hospital Admission (emergent condition) X- 1 10 Simple Discharge Coordination []  - 0 Complex (extensive) Discharge Coordination PROCESS - Special Needs []  - Pediatric / Minor Patient Management 0 []  - 0 Isolation Patient Management []  - 0 Hearing / Language / Visual special needs []  - 0 Assessment of Community assistance (transportation, D/C planning,  etc.) []  - 0 Additional assistance / Altered mentation []  - 0 Support Surface(s) Assessment (bed, cushion, seat, etc.) INTERVENTIONS - Wound Cleansing / Measurement [name], [name] ([id number]) X- 1 5  Simple Wound Cleansing - one wound []  - 0 Complex Wound Cleansing - multiple wounds X- 1 5 Wound Imaging (photographs - any number of wounds) []  - 0 Wound Tracing (instead of photographs) X- 1 5 Simple Wound Measurement - one wound []  - 0 Complex Wound Measurement - multiple wounds INTERVENTIONS - Wound Dressings X - Small Wound Dressing one or multiple wounds 1 10 []  - 0 Medium Wound Dressing one or multiple wounds []  - 0 Large Wound Dressing one or multiple wounds X- 1 5 Application of Medications - topical []  - 0 Application of Medications - injection INTERVENTIONS - Miscellaneous []  - External ear exam 0 []  - 0 Specimen Collection (cultures, biopsies, blood, body fluids, etc.) []  - 0 Specimen(s) / Culture(s) sent or taken to Lab for analysis []  - 0 Patient Transfer (multiple staff / Civil Service fast streamer / Similar devices) []  - 0 Simple Staple / Suture removal (25 or less) []  - 0 Complex Staple / Suture removal (26 or more) []  - 0 Hypo / Hyperglycemic Management (close monitor of Blood Glucose) []  - 0 Ankle / Brachial Index (ABI) - do not check if billed separately X- 1 5 Vital Signs Has the patient been seen at the hospital within the last three years: Yes Total Score: 80 Level Of Care: New/Established - Level 3 Electronic Signature(s) Signed: 06/13/2021 2:51:09 PM By: Carlene Coria RN Entered By: Carlene Coria on 06/13/2021 14:41:05 [name] ([id number]) -------------------------------------------------------------------------------- Encounter Discharge Information Details [name] Date of Service: 06/13/2021 2:15 PM Medical Record Number: [id number] Patient Account Number: [id number] Date of Birth/Sex: 21-Jul-1945 (75 y.o. F) Treating RN:  Carlene Coria Primary Care Ferrell Claiborne: [facility] Other Clinician: Referring Anette Barra: [facility] Treating Tyris Eliot/Extender: Yaakov Guthrie in Treatment: 4 Encounter Discharge Information Items Discharge Condition: Stable Ambulatory Status: Ambulatory Discharge Destination: Home Transportation: Private Auto Accompanied By: self Schedule Follow-up Appointment: Yes Clinical Summary of Care: Patient Declined Electronic Signature(s) Signed: 06/13/2021 2:51:09 PM By: Carlene Coria RN Entered By: Carlene Coria on 06/13/2021 14:45:51 [name] ([id number]) -------------------------------------------------------------------------------- Lower Extremity Assessment Details [name] Date of Service: 06/13/2021 2:15 PM Medical Record Number: [id number] Patient Account Number: [id number] Date of Birth/Sex: Oct 21, 1945 (75 y.o. F) Treating RN: Carlene Coria Primary Care Sakoya Win: [facility] Other Clinician: Referring Oshay Stranahan: [facility] Treating Oisin Yoakum/Extender: Yaakov Guthrie in Treatment: 4 Electronic Signature(s) Signed: 06/13/2021 2:51:09 PM By: Carlene Coria RN Entered By: Carlene Coria on 06/13/2021 14:27:15 [name], [name] ([id number]) -------------------------------------------------------------------------------- Multi Wound Chart Details [name] Date of Service: 06/13/2021 2:15 PM Medical Record Number: [id number] Patient Account Number: [id number] Date of Birth/Sex: June 17, 1946 (75 y.o. F) Treating RN: Carlene Coria Primary Care Fadil Macmaster: [facility] Other Clinician: Referring Aracely Rickett: [facility] Treating Arihana Ambrocio/Extender: Yaakov Guthrie in Treatment: 4 Vital Signs Height(in): 67 Pulse(bpm): 66 Weight(lbs): 163 Blood Pressure(mmHg): 130/79 Body Mass Index(BMI): 26 Temperature(F): 97.8 Respiratory Rate(breaths/min): 18 Photos: [N/A:N/A] Wound  Location: Left Knee N/A N/A Wounding Event: Hematoma N/A N/A Primary Etiology: Trauma, Other N/A N/A Comorbid History: Chronic Obstructive Pulmonary N/A N/A Disease (COPD), Hypertension, Type II Diabetes Date Acquired: 04/13/2021 N/A N/A Weeks of Treatment: 4 N/A N/A Wound Status: Open N/A N/A Clustered Wound: Yes N/A N/A Measurements L x W x D (cm) 0.5x0.5x0.2 N/A N/A Area (cm) : 0.196 N/A N/A Volume (cm) : 0.039 N/A N/A % Reduction in Area: 55.50% N/A N/A % Reduction in Volume: 94.50% N/A N/A Classification: Full  Thickness Without Exposed N/A N/A Support Structures Exudate Amount: Medium N/A N/A Exudate Type: Serosanguineous N/A N/A Exudate Color: red, brown N/A N/A Granulation Amount: Large (67-100%) N/A N/A Granulation Quality: Red, Pink N/A N/A Necrotic Amount: Small (1-33%) N/A N/A Exposed Structures: Fat Layer (Subcutaneous Tissue): N/A N/A Yes Fascia: No Tendon: No Muscle: No Joint: No Bone: No Epithelialization: None N/A N/A Treatment Notes Wound #4 (Knee) Wound Laterality: Left Cleanser Byram Ancillary Kit - 15 Day Supply Discharge Instruction: Use supplies as instructed; Kit contains: (15) Saline Bullets; (15) 3x3 Gauze; 15 pr Gloves Normal Saline Triska, Delona C. ([id number]) Discharge Instruction: Wash your hands with soap and water. Remove old dressing, discard into plastic bag and place into trash. Cleanse the wound with Normal Saline prior to applying a clean dressing using gauze sponges, not tissues or cotton balls. Do not scrub or use excessive force. Pat dry using gauze sponges, not tissue or cotton balls. Peri-Wound Care Topical Primary Dressing Prisma 4.34 (in) Discharge Instruction: Moisten w/normal saline or sterile water; Cover wound as directed. Do not remove from wound bed. Secondary Dressing Telfa Adhesive Island Dressing, 4x4 (in/in) Discharge Instruction: Apply over dressing to secure in place. Secured With Compression Wrap Compression  Stockings Add-Ons Electronic Signature(s) Signed: 06/13/2021 2:54:14 PM By: Kalman Shan DO Entered By: Kalman Shan on 06/13/2021 14:47:53 [name], [name] ([id number]) -------------------------------------------------------------------------------- Multi-Disciplinary Care Plan Details [name] Date of Service: 06/13/2021 2:15 PM Medical Record Number: [id number] Patient Account Number: [id number] Date of Birth/Sex: 09-Sep-1945 (75 y.o. F) Treating RN: Carlene Coria Primary Care Shilo Philipson: [facility] Other Clinician: Referring Emilygrace Grothe: [facility] Treating Amali Uhls/Extender: Yaakov Guthrie in Treatment: 4 Active Inactive Wound/Skin Impairment Nursing Diagnoses: Impaired tissue integrity Knowledge deficit related to smoking impact on wound healing Knowledge deficit related to ulceration/compromised skin integrity Goals: Patient will demonstrate a reduced rate of smoking or cessation of smoking Date Initiated: 05/16/2021 Target Resolution Date: 05/16/2021 Goal Status: Active Ulcer/skin breakdown will have a volume reduction of 30% by week 4 Date Initiated: 05/16/2021 Target Resolution Date: 06/15/2021 Goal Status: Active Interventions: Assess ulceration(s) every visit Treatment Activities: Skin care regimen initiated : 05/16/2021 Topical wound management initiated : 05/16/2021 Notes: Electronic Signature(s) Signed: 06/13/2021 2:51:09 PM By: Carlene Coria RN Entered By: Carlene Coria on 06/13/2021 14:39:29 [name] ([id number]) -------------------------------------------------------------------------------- Pain Assessment Details [name] Date of Service: 06/13/2021 2:15 PM Medical Record Number: [id number] Patient Account Number: [id number] Date of Birth/Sex: 1946/03/01 (75 y.o. F) Treating RN: Carlene Coria Primary Care Janara Klett: [facility] Other Clinician: Referring Benelli Winther: [facility] Treating Labrandon Knoch/Extender: Yaakov Guthrie in Treatment: 4 Active Problems Location of Pain Severity and Description of Pain Patient Has Paino No Site Locations Pain Management and Medication Current Pain Management: Electronic Signature(s) Signed: 06/13/2021 2:51:09 PM By: Carlene Coria RN Entered By: Carlene Coria on 06/13/2021 14:24:28 [name] ([id number]) -------------------------------------------------------------------------------- Patient/Caregiver Education Details [name] Date of Service: 06/13/2021 2:15 PM Medical Record Number: [id number] Patient Account Number: [id number] Date of Birth/Gender: [birth year] (75 y.o. F) Treating RN: Carlene Coria Primary Care Physician: [facility] Other Clinician: Referring Physician: [facility] Treating Physician/Extender: Yaakov Guthrie in Treatment: 4 Education Assessment Education Provided To: Patient Education Topics Provided Wound/Skin Impairment: Methods: Explain/Verbal Responses: State content correctly Electronic Signature(s) Signed: 06/13/2021 2:51:09 PM By: Carlene Coria RN Entered By: Carlene Coria on 06/13/2021 14:45:14 [name], [name] ([id number]) -------------------------------------------------------------------------------- Wound Assessment Details Patient Name: Janet Felling  C. Date of Service: 06/13/2021 2:15 PM Medical Record Number: [id number] Patient Account Number: [id number] Date of Birth/Sex: 08/17/1945 (75 y.o. F) Treating RN: Carlene Coria Primary Care Natalyn Szymanowski: [facility] Other Clinician: Referring Kineta Fudala: [facility] Treating Marybella Ethier/Extender: Yaakov Guthrie in Treatment: 4 Wound Status Wound Number: 4 Primary Trauma, Other Etiology: Wound Location: Left Knee Wound Status: Open Wounding Event: Hematoma Comorbid Chronic Obstructive Pulmonary Disease (COPD), Date Acquired: 04/13/2021 History:  Hypertension, Type II Diabetes Weeks Of Treatment: 4 Clustered Wound: Yes Photos Wound Measurements Length: (cm) 0.5 Width: (cm) 0.5 Depth: (cm) 0.2 Area: (cm) 0.196 Volume: (cm) 0.039 % Reduction in Area: 55.5% % Reduction in Volume: 94.5% Epithelialization: None Tunneling: No Undermining: No Wound Description Classification: Full Thickness Without Exposed Support Structures Exudate Amount: Medium Exudate Type: Serosanguineous Exudate Color: red, brown Foul Odor After Cleansing: No Slough/Fibrino Yes Wound Bed Granulation Amount: Large (67-100%) Exposed Structure Granulation Quality: Red, Pink Fascia Exposed: No Necrotic Amount: Small (1-33%) Fat Layer (Subcutaneous Tissue) Exposed: Yes Necrotic Quality: Adherent Slough Tendon Exposed: No Muscle Exposed: No Joint Exposed: No Bone Exposed: No Treatment Notes Wound #4 (Knee) Wound Laterality: Left Cleanser Byram Ancillary Kit - 15 Day Supply Discharge Instruction: Use supplies as instructed; Kit contains: (15) Saline Bullets; (15) 3x3 Gauze; 15 pr Gloves Normal Saline Discharge Instruction: Wash your hands with soap and water. Remove old dressing, discard into plastic bag and place into trash. Cleanse the wound with Normal Saline prior to applying a clean dressing using gauze sponges, not tissues or cotton balls. Do not [name], [name] ([id number]) scrub or use excessive force. Pat dry using gauze sponges, not tissue or cotton balls. Peri-Wound Care Topical Primary Dressing Prisma 4.34 (in) Discharge Instruction: Moisten w/normal saline or sterile water; Cover wound as directed. Do not remove from wound bed. Secondary Dressing Telfa Adhesive [place] Dressing, 4x4 (in/in) Discharge Instruction: Apply over dressing to secure in place. Secured With Compression Wrap Compression Stockings Add-Ons Electronic Signature(s) Signed: 06/13/2021 2:51:09 PM By: Carlene Coria RN Entered By: Carlene Coria on 06/13/2021  14:26:53 [name], [name] ([id number]) -------------------------------------------------------------------------------- Vitals Details [name] Date of Service: 06/13/2021 2:15 PM Medical Record Number: [id number] Patient Account Number: [id number] Date of Birth/Sex: 08/12/1945 (75 y.o. F) Treating RN: Carlene Coria Primary Care Jacobe Study: [facility] Other Clinician: Referring Starlena Beil: [facility] Treating Tokiko Diefenderfer/Extender: Yaakov Guthrie in Treatment: 4 Vital Signs Time Taken: 14:24 Temperature (F): 97.8 Height (in): 67 Pulse (bpm): 83 Weight (lbs): 163 Respiratory Rate (breaths/min): 18 Body Mass Index (BMI): 25.5 Blood Pressure (mmHg): 130/79 Reference Range: 80 - 120 mg / dl Electronic Signature(s) Signed: 06/13/2021 2:51:09 PM By: Carlene Coria RN Entered By: Carlene Coria on 06/13/2021 14:24:21

## 2021-06-20 ENCOUNTER — Ambulatory Visit: Payer: Medicare Other | Admitting: Internal Medicine

## 2021-06-27 ENCOUNTER — Encounter: Payer: Medicare Other | Attending: Internal Medicine | Admitting: Internal Medicine

## 2021-06-27 ENCOUNTER — Other Ambulatory Visit: Payer: Self-pay

## 2021-06-27 DIAGNOSIS — E1151 Type 2 diabetes mellitus with diabetic peripheral angiopathy without gangrene: Secondary | ICD-10-CM | POA: Diagnosis not present

## 2021-06-27 DIAGNOSIS — L97822 Non-pressure chronic ulcer of other part of left lower leg with fat layer exposed: Secondary | ICD-10-CM

## 2021-06-27 DIAGNOSIS — E11622 Type 2 diabetes mellitus with other skin ulcer: Secondary | ICD-10-CM | POA: Diagnosis present

## 2021-06-27 NOTE — Progress Notes (Signed)
LOCKIE, BOTHUN ([id number]) Visit Report for 06/27/2021 Chief Complaint Document Details [name], [name] Date of Service: 06/27/2021 2:00 PM Medical Record Number: [id number] Patient Account Number: [id number] Date of Birth/Sex: May 09, 1946 (75 y.o. F) Treating RN: Yevonne Pax Primary Care Provider: [facility] Other Clinician: Referring Provider: [facility] Treating Provider/Extender: Tilda Franco in Treatment: 6 Information Obtained from: Patient Chief Complaint Patient presents for a left knee wound status post fall Electronic Signature(s) Signed: 06/27/2021 2:24:35 PM By: Geralyn Corwin DO Entered By: Geralyn Corwin on 06/27/2021 14:21:14 [name] ([id number]) -------------------------------------------------------------------------------- HPI Details [name] Date of Service: 06/27/2021 2:00 PM Medical Record Number: [id number] Patient Account Number: [id number] Date of Birth/Sex: 1946/02/28 (75 y.o. F) Treating RN: Yevonne Pax Primary Care Provider: [facility] Other Clinician: Referring Provider: [facility] Treating Provider/Extender: Tilda Franco in Treatment: 6 History of Present Illness Location: right lower extremity in the area of the lateral calf below the knee Quality: She is having no pain Context: The wound occurred when the patient a blunt injury and fall injury Modifying Factors: None as patient was healed Associated Signs and Symptoms: Patient reports having increase discharge. HPI Description: Admission 05/16/2021 Ms. Jacob Chamblee is a 75 year old female with a past medical history of type 2 diabetes controlled on metformin and essential hypertension that presents to the clinic for a right knee wound. She states that 6 weeks ago she fell tripping over her ottoman onto her left knee. She states that she put ice on the area and has not used any other  treatment modalities. She presents today with scabs on her knee. She states she actually did not want to follow-up but her son recommended it. She has not seen any other healthcare provider for this issue. She has not been on antibiotics for this. She currently denies pain or signs of infection. 11/9; patient presents for 1 week follow-up. She has been using iodoform packing to the wound bed without issues. She denies signs of infection. She has no issues or complaints today. 11/16; patient presents for 1 week follow-up. She has been using iodoform packing to the wound bed. She has no issues or complaints today. She denies signs of infection. 11/23; patient presents for 1 week follow-up. She has been using collagen to the wound bed. She reports improvement in wound healing. She denies signs of infection. 11/30; patient presents for 1 week follow-up. She has been using collagen to the wound bed. She has no issues or complaints today. 12/14; patient presents for follow-up. She has been using collagen to the wound bed. She reports improvement in wound healing. She has no issues or complaints today. Electronic Signature(s) Signed: 06/27/2021 2:24:35 PM By: Geralyn Corwin DO Entered By: Geralyn Corwin on 06/27/2021 14:21:40 [name] ([id number]) -------------------------------------------------------------------------------- Physical Exam Details [name] Date of Service: 06/27/2021 2:00 PM Medical Record Number: [id number] Patient Account Number: [id number] Date of Birth/Sex: 10-28-1945 (75 y.o. F) Treating RN: Yevonne Pax Primary Care Provider: [facility] Other Clinician: Referring Provider: [facility] Treating Provider/Extender: Tilda Franco in Treatment: 6 Constitutional . Psychiatric . Notes Left knee with epithelialization to the previous wound site. Surrounding skin is intact. No drainage. No induration. Electronic  Signature(s) Signed: 06/27/2021 2:24:35 PM By: Geralyn Corwin DO Entered By: Geralyn Corwin on 06/27/2021 14:22:12 [name] ([id number]) -------------------------------------------------------------------------------- Physician Orders Details [name] Date of Service: 06/27/2021 2:00 PM Medical Record  Number: [id number] Patient Account Number: [id number] Date of Birth/Sex: 02/05/1946 (75 y.o. F) Treating RN: Carlene Coria Primary Care Provider: [facility] Other Clinician: Referring Provider: [facility] Treating Provider/Extender: Yaakov Guthrie in Treatment: 6 Verbal / Phone Orders: No Diagnosis Coding Discharge From [facility] Services o Discharge from [place] Treatment Complete - cover times 1 week then open to air Electronic Signature(s) Signed: 06/27/2021 2:24:35 PM By: Kalman Shan DO Entered By: Kalman Shan on 06/27/2021 14:23:38 [name] ([id number]) -------------------------------------------------------------------------------- Problem List Details [name] Date of Service: 06/27/2021 2:00 PM Medical Record Number: [id number] Patient Account Number: [id number] Date of Birth/Sex: 02-09-1946 (75 y.o. F) Treating RN: Carlene Coria Primary Care Provider: [facility] Other Clinician: Referring Provider: [facility] Treating Provider/Extender: Yaakov Guthrie in Treatment: 6 Active Problems ICD-10 Encounter Code Description Active Date MDM Diagnosis L97.822 Non-pressure chronic ulcer of other part of left lower leg with fat layer 05/16/2021 No Yes exposed E11.622 Type 2 diabetes mellitus with other skin ulcer 05/16/2021 No Yes Inactive Problems Resolved Problems Electronic Signature(s) Signed: 06/27/2021 2:24:35 PM By: Kalman Shan DO Entered By: Kalman Shan on 06/27/2021 14:21:09 [name]  ([id number]) -------------------------------------------------------------------------------- Progress Note Details [name] Date of Service: 06/27/2021 2:00 PM Medical Record Number: [id number] Patient Account Number: [id number] Date of Birth/Sex: Nov 24, 1945 (75 y.o. F) Treating RN: Carlene Coria Primary Care Provider: [facility] Other Clinician: Referring Provider: [facility] Treating Provider/Extender: Yaakov Guthrie in Treatment: 6 Subjective Chief Complaint Information obtained from Patient Patient presents for a left knee wound status post fall History of Present Illness (HPI) The following HPI elements were documented for the patient's wound: Location: right lower extremity in the area of the lateral calf below the knee Quality: She is having no pain Context: The wound occurred when the patient a blunt injury and fall injury Modifying Factors: None as patient was healed Associated Signs and Symptoms: Patient reports having increase discharge. Admission 05/16/2021 Ms. Shala Kolasinski is a 75 year old female with a past medical history of type 2 diabetes controlled on metformin and essential hypertension that presents to the clinic for a right knee wound. She states that 6 weeks ago she fell tripping over her ottoman onto her left knee. She states that she put ice on the area and has not used any other treatment modalities. She presents today with scabs on her knee. She states she actually did not want to follow-up but her son recommended it. She has not seen any other healthcare provider for this issue. She has not been on antibiotics for this. She currently denies pain or signs of infection. 11/9; patient presents for 1 week follow-up. She has been using iodoform packing to the wound bed without issues. She denies signs of infection. She has no issues or complaints today. 11/16; patient presents for 1 week follow-up. She has been using  iodoform packing to the wound bed. She has no issues or complaints today. She denies signs of infection. 11/23; patient presents for 1 week follow-up. She has been using collagen to the wound bed. She reports improvement in wound healing. She denies signs of infection. 11/30; patient presents for 1 week follow-up. She has been using collagen to the wound bed. She has no issues or complaints today. 12/14; patient presents for follow-up. She has been using collagen to the wound bed. She reports improvement in wound healing. She has no issues or complaints today. Patient History  Information obtained from Patient. Social History Former smoker, Marital Status - Married, Alcohol Use - Never, Drug Use - No History, Caffeine Use - Daily. Medical History Eyes Denies history of Cataracts, Glaucoma, Optic Neuritis Ear/Nose/Mouth/Throat Denies history of Chronic sinus problems/congestion, Middle ear problems Hematologic/Lymphatic Denies history of Anemia, Hemophilia, Human Immunodeficiency Virus, Lymphedema, Sickle Cell Disease Respiratory Patient has history of Chronic Obstructive Pulmonary Disease (COPD) Denies history of Aspiration, Asthma, Pneumothorax, Sleep Apnea, Tuberculosis Cardiovascular Patient has history of Hypertension Denies history of Angina, Arrhythmia, Congestive Heart Failure, Coronary Artery Disease, Deep Vein Thrombosis, Hypotension, Myocardial Infarction, Peripheral Arterial Disease, Peripheral Venous Disease, Phlebitis, Vasculitis Gastrointestinal Denies history of Cirrhosis , Colitis, Crohn s, Hepatitis A, Hepatitis B, Hepatitis C Endocrine Patient has history of Type II Diabetes Denies history of Type I Diabetes Immunological Denies history of Lupus Erythematosus, Raynaud s, Scleroderma Integumentary (Skin) Denies history of History of Burn, History of pressure wounds Musculoskeletal Denies history of Gout, Rheumatoid Arthritis, Osteoarthritis, Osteomyelitis [name],  [name] ([id number]) Neurologic Denies history of Dementia, Neuropathy, Quadriplegia, Paraplegia, Seizure Disorder Oncologic Denies history of Received Chemotherapy, Received Radiation Medical And Surgical History Notes Cardiovascular hx SVT - followed by Dr Ubaldo Glassing Objective Constitutional Vitals Time Taken: 2:06 PM, Height: 67 in, Weight: 163 lbs, BMI: 25.5, Temperature: 97.7 F, Pulse: 75 bpm, Respiratory Rate: 16 breaths/min, Blood Pressure: 139/71 mmHg. General Notes: Left knee with epithelialization to the previous wound site. Surrounding skin is intact. No drainage. No induration. Integumentary (Hair, Skin) Wound #4 status is Open. Original cause of wound was Hematoma. The date acquired was: 04/13/2021. The wound has been in treatment 6 weeks. The wound is located on the Left Knee. The wound measures 0cm length x 0cm width x 0cm depth; 0cm^2 area and 0cm^3 volume. There is no tunneling or undermining noted. There is a none present amount of drainage noted. There is no granulation within the wound bed. There is no necrotic tissue within the wound bed. Assessment Active Problems ICD-10 Non-pressure chronic ulcer of other part of left lower leg with fat layer exposed Type 2 diabetes mellitus with other skin ulcer Patient has done well with collagen. Her wound is healed. I asked her to keep the area protected for the next week with foam border dressings daily. She knows to call with any questions or concerns and can follow-up as needed. Plan Discharge From [facility] Services: Discharge from [place] Treatment Complete - cover times 1 week then open to air 1. Discharge from clinic due to closed wound 2. Follow-up as needed Electronic Signature(s) Signed: 06/27/2021 2:24:35 PM By: Kalman Shan DO Entered By: Kalman Shan on 06/27/2021 14:24:14 [name] ([id number]) -------------------------------------------------------------------------------- ROS/PFSH  Details [name] Date of Service: 06/27/2021 2:00 PM Medical Record Number: [id number] Patient Account Number: [id number] Date of Birth/Sex: Sep 01, 1945 (75 y.o. F) Treating RN: Carlene Coria Primary Care Provider: [facility] Other Clinician: Referring Provider: [facility] Treating Provider/Extender: Yaakov Guthrie in Treatment: 6 Information Obtained From Patient Eyes Medical History: Negative for: Cataracts; Glaucoma; Optic Neuritis Ear/Nose/Mouth/Throat Medical History: Negative for: Chronic sinus problems/congestion; Middle ear problems Hematologic/Lymphatic Medical History: Negative for: Anemia; Hemophilia; Human Immunodeficiency Virus; Lymphedema; Sickle Cell Disease Respiratory Medical History: Positive for: Chronic Obstructive Pulmonary Disease (COPD) Negative for: Aspiration; Asthma; Pneumothorax; Sleep Apnea; Tuberculosis Cardiovascular Medical History: Positive for: Hypertension Negative for: Angina; Arrhythmia; Congestive Heart Failure; Coronary Artery Disease; Deep Vein Thrombosis; Hypotension; Myocardial Infarction; Peripheral Arterial Disease; Peripheral Venous Disease; Phlebitis; Vasculitis Past Medical History  Notes: hx SVT - followed by Dr Ubaldo Glassing Gastrointestinal Medical History: Negative for: Cirrhosis ; Colitis; Crohnos; Hepatitis A; Hepatitis B; Hepatitis C Endocrine Medical History: Positive for: Type II Diabetes Negative for: Type I Diabetes Time with diabetes: 10 years Treated with: Oral agents Immunological Medical History: Negative for: Lupus Erythematosus; Raynaudos; Scleroderma Integumentary (Skin) Medical History: Negative for: History of Burn; History of pressure wounds Musculoskeletal Coggins, Sianne C. ([id number]) Medical History: Negative for: Gout; Rheumatoid Arthritis; Osteoarthritis; Osteomyelitis Neurologic Medical History: Negative for: Dementia; Neuropathy; Quadriplegia; Paraplegia;  Seizure Disorder Oncologic Medical History: Negative for: Received Chemotherapy; Received Radiation Immunizations Pneumococcal Vaccine: Received Pneumococcal Vaccination: Yes Received Pneumococcal Vaccination On or After 60th Birthday: Yes Immunization Notes: up to date Implantable Devices No devices added Family and Social History Former smoker; Marital Status - Married; Alcohol Use: Never; Drug Use: No History; Caffeine Use: Daily; Financial Concerns: No; Food, Clothing or Shelter Needs: No; Support System Lacking: No; Transportation Concerns: No Electronic Signature(s) Signed: 06/27/2021 2:24:35 PM By: Kalman Shan DO Signed: 06/27/2021 4:05:54 PM By: Carlene Coria RN Entered By: Kalman Shan on 06/27/2021 14:23:48 [name], [name] ([id number]) -------------------------------------------------------------------------------- [place] Details [name] Date of Service: 06/27/2021 Medical Record Number: [id number] Patient Account Number: [id number] Date of Birth/Sex: July 03, 1946 (75 y.o. F) Treating RN: Carlene Coria Primary Care Provider: [facility] Other Clinician: Referring Provider: [facility] Treating Provider/Extender: Yaakov Guthrie in Treatment: 6 Diagnosis Coding ICD-10 Codes Code Description [phone] Non-pressure chronic ulcer of other part of left lower leg with fat layer exposed E11.622 Type 2 diabetes mellitus with other skin ulcer Facility Procedures CPT4 Code: [id number] Description: [phone] - WOUND CARE VISIT-LEV 2 EST PT Modifier: Quantity: 1 Physician Procedures CPT4 Code: [id number] Description: [id number] - WC PHYS LEVEL 3 - EST PT Modifier: Quantity: 1 CPT4 Code: Description: ICD-10 Diagnosis Description L97.822 Non-pressure chronic ulcer of other part of left lower leg with fat lay E11.622 Type 2 diabetes mellitus with other skin ulcer Modifier: er exposed Quantity: Electronic Signature(s) Signed:  06/27/2021 2:24:35 PM By: Kalman Shan DO Entered By: Kalman Shan on 06/27/2021 14:23:25

## 2021-06-27 NOTE — Progress Notes (Signed)
[name], [name] ([id number]) Visit Report for 06/27/2021 Arrival Information Details [name], [name] Date of Service: 06/27/2021 2:00 PM Medical Record Number: [id number] Patient Account Number: [id number] Date of Birth/Sex: 02-27-1946 (75 y.o. F) Treating RN: Yevonne Pax Primary Care Nayra Coury: [facility] Other Clinician: Referring Earle Troiano: [facility] Treating Dayvion Sans/Extender: Tilda Franco in Treatment: 6 Visit Information History Since Last Visit All ordered tests and consults were completed: No Patient Arrived: Ambulatory Added or deleted any medications: No Arrival Time: 14:03 Any new allergies or adverse reactions: No Accompanied By: self Had a fall or experienced change in No Transfer Assistance: None activities of daily living that may affect Patient Identification Verified: Yes risk of falls: Secondary Verification Process Completed: Yes Signs or symptoms of abuse/neglect since last visito No Patient Requires Transmission-Based Precautions: No Hospitalized since last visit: No Patient Has Alerts: No Implantable device outside of the clinic excluding No cellular tissue based products placed in the center since last visit: Has Dressing in Place as Prescribed: Yes Pain Present Now: No Electronic Signature(s) Signed: 06/27/2021 4:05:54 PM By: Yevonne Pax RN Entered By: Yevonne Pax on 06/27/2021 14:06:12 [name] ([id number]) -------------------------------------------------------------------------------- Clinic Level of Care Assessment Details [name] Date of Service: 06/27/2021 2:00 PM Medical Record Number: [id number] Patient Account Number: [id number] Date of Birth/Sex: 1945/10/20 (75 y.o. F) Treating RN: Yevonne Pax Primary Care Nijel Flink: [facility] Other Clinician: Referring Anasia Agro: [facility] Treating Clatie Kessen/Extender: Tilda Franco in Treatment:  6 Clinic Level of Care Assessment Items TOOL 4 Quantity Score X - Use when only an EandM is performed on FOLLOW-UP visit 1 0 ASSESSMENTS - Nursing Assessment / Reassessment X - Reassessment of Co-morbidities (includes updates in patient status) 1 10 X- 1 5 Reassessment of Adherence to Treatment Plan ASSESSMENTS - Wound and Skin Assessment / Reassessment X - Simple Wound Assessment / Reassessment - one wound 1 5 []  - 0 Complex Wound Assessment / Reassessment - multiple wounds []  - 0 Dermatologic / Skin Assessment (not related to wound area) ASSESSMENTS - Focused Assessment []  - Circumferential Edema Measurements - multi extremities 0 []  - 0 Nutritional Assessment / Counseling / Intervention []  - 0 Lower Extremity Assessment (monofilament, tuning fork, pulses) []  - 0 Peripheral Arterial Disease Assessment (using hand held doppler) ASSESSMENTS - Ostomy and/or Continence Assessment and Care []  - Incontinence Assessment and Management 0 []  - 0 Ostomy Care Assessment and Management (repouching, etc.) PROCESS - Coordination of Care X - Simple Patient / Family Education for ongoing care 1 15 []  - 0 Complex (extensive) Patient / Family Education for ongoing care []  - 0 Staff obtains , Records, Test Results / Process Orders []  - 0 Staff telephones HHA, Nursing Homes / Clarify orders / etc []  - 0 Routine Transfer to another Facility (non-emergent condition) []  - 0 Routine Hospital Admission (non-emergent condition) []  - 0 New Admissions / / Ordering NPWT, Apligraf, etc. []  - 0 Emergency Hospital Admission (emergent condition) X- 1 10 Simple Discharge Coordination []  - 0 Complex (extensive) Discharge Coordination PROCESS - Special Needs []  - Pediatric / Minor Patient Management 0 []  - 0 Isolation Patient Management []  - 0 Hearing / Language / Visual special needs []  - 0 Assessment of Community assistance (transportation, D/C planning,  etc.) []  - 0 Additional assistance / Altered mentation []  - 0 Support Surface(s) Assessment (bed, cushion, seat, etc.) INTERVENTIONS - Wound Cleansing / Measurement Phagan, Richard C. ( ) X- 1 5  Simple Wound Cleansing - one wound []  - 0 Complex Wound Cleansing - multiple wounds X- 1 5 Wound Imaging (photographs - any number of wounds) []  - 0 Wound Tracing (instead of photographs) X- 1 5 Simple Wound Measurement - one wound []  - 0 Complex Wound Measurement - multiple wounds INTERVENTIONS - Wound Dressings []  - Small Wound Dressing one or multiple wounds 0 []  - 0 Medium Wound Dressing one or multiple wounds []  - 0 Large Wound Dressing one or multiple wounds []  - 0 Application of Medications - topical []  - 0 Application of Medications - injection INTERVENTIONS - Miscellaneous []  - External ear exam 0 []  - 0 Specimen Collection (cultures, biopsies, blood, body fluids, etc.) []  - 0 Specimen(s) / Culture(s) sent or taken to Lab for analysis []  - 0 Patient Transfer (multiple staff / / Similar devices) []  - 0 Simple Staple / Suture removal (25 or less) []  - 0 Complex Staple / Suture removal (26 or more) []  - 0 Hypo / Hyperglycemic Management (close monitor of Blood Glucose) []  - 0 Ankle / Brachial Index (ABI) - do not check if billed separately X- 1 5 Vital Signs Has the patient been seen at the hospital within the last three years: Yes Total Score: 65 Level Of Care: New/Established - Level 2 Electronic Signature(s) Signed: 06/27/2021 4:05:54 PM By: RN Entered By: on 06/27/2021 14:19:08 ( ) -------------------------------------------------------------------------------- Encounter Discharge Information Details Patient Name: Date of Service: 06/27/2021 2:00 PM Medical Record Number: Patient Account Number: Date of Birth/Sex: 1945-10-23 (75 y.o. F) Treating RN:  Primary Care Sandip Power: , [facility] Other Clinician: Referring Mallarie Voorhies: [facility] Treating Deangleo Passage/Extender: in Treatment: 6 Encounter Discharge Information Items Discharge Condition: Stable Ambulatory Status: Ambulatory Discharge Destination: Home Transportation: Private Auto Accompanied By: self Schedule Follow-up Appointment: Yes Clinical Summary of Care: Patient Declined Electronic Signature(s) Signed: 06/27/2021 4:05:54 PM By: Yevonne Pax RN Entered By: Yevonne Pax on 06/27/2021 14:19:59 [name] ([id number]) -------------------------------------------------------------------------------- Lower Extremity Assessment Details [name] Date of Service: 06/27/2021 2:00 PM Medical Record Number: [id number] Patient Account Number: [id number] Date of Birth/Sex: [birth year] (75 y.o. F) Treating RN: Yevonne Pax Primary Care Autymn Omlor: [facility] Other Clinician: Referring Konner Warrior: [facility] Treating Dickie Cloe/Extender: Tilda Franco in Treatment: 6 Electronic Signature(s) Signed: 06/27/2021 4:05:54 PM By: Yevonne Pax RN Entered By: Yevonne Pax on 06/27/2021 14:15:52 [name] ([id number]) -------------------------------------------------------------------------------- Multi Wound Chart Details [name] Date of Service: 06/27/2021 2:00 PM Medical Record Number: [id number] Patient Account Number: [id number] Date of Birth/Sex: [birth year] (75 y.o. F) Treating RN: Yevonne Pax Primary Care Tylor Courtwright: [facility] Other Clinician: Referring Coltyn Hanning: [facility] Treating Alyscia Carmon/Extender: Tilda Franco in Treatment: 6 Vital Signs Height(in): 67 Pulse(bpm): 75 Weight(lbs): 163 Blood Pressure(mmHg): 139/71 Body Mass Index(BMI): 26 Temperature(F): 97.7 Respiratory Rate(breaths/min): 16 Photos: [N/A:N/A] Wound  Location: Left Knee N/A N/A Wounding Event: Hematoma N/A N/A Primary Etiology: Trauma, Other N/A N/A Comorbid History: Chronic Obstructive Pulmonary N/A N/A Disease (COPD), Hypertension, Type II Diabetes Date Acquired: 04/13/2021 N/A N/A Weeks of Treatment: 6 N/A N/A Wound Status: Open N/A N/A Clustered Wound: Yes N/A N/A Measurements L x W x D (cm) 0x0x0 N/A N/A Area (cm) : 0 N/A N/A Volume (cm) : 0 N/A N/A % Reduction in Area: 100.00% N/A N/A % Reduction in Volume: 100.00% N/A N/A Classification: Full Thickness  Without Exposed N/A N/A Support Structures Exudate Amount: None Present N/A N/A Granulation Amount: None Present (0%) N/A N/A Necrotic Amount: None Present (0%) N/A N/A Exposed Structures: Fascia: No N/A N/A Fat Layer (Subcutaneous Tissue): No Tendon: No Muscle: No Joint: No Bone: No Epithelialization: Large (67-100%) N/A N/A Treatment Notes Wound #4 (Knee) Wound Laterality: Left Cleanser Peri-Wound Care Topical Primary Dressing Secondary Dressing [employer] ([id number]) Secured With Compression Wrap Compression Stockings Add-Ons Wound #4 (Knee) Wound Laterality: Left Cleanser Peri-Wound Care Topical Primary Dressing Secondary Dressing Secured With Compression Wrap Compression Stockings Add-Ons Electronic Signature(s) Signed: 06/27/2021 2:24:35 PM By: Geralyn Corwin DO Entered By: Geralyn Corwin on 06/27/2021 14:23:57 [name] ([id number]) -------------------------------------------------------------------------------- Multi-Disciplinary Care Plan Details [name] Date of Service: 06/27/2021 2:00 PM Medical Record Number: [id number] Patient Account Number: [id number] Date of Birth/Sex: 22-Mar-1946 (75 y.o. F) Treating RN: Yevonne Pax Primary Care Akaisha Truman: [facility] Other Clinician: Referring Jenavi Beedle: [facility] Treating Ellean Firman/Extender: Tilda Franco in Treatment: 6 Active  Inactive Electronic Signature(s) Signed: 06/27/2021 4:05:54 PM By: Yevonne Pax RN Entered By: Yevonne Pax on 06/27/2021 14:17:38 [name] ([id number]) -------------------------------------------------------------------------------- Pain Assessment Details [name] Date of Service: 06/27/2021 2:00 PM Medical Record Number: [id number] Patient Account Number: [id number] Date of Birth/Sex: March 30, 1946 (75 y.o. F) Treating RN: Yevonne Pax Primary Care Sony Schlarb: [facility] Other Clinician: Referring Saahas Hidrogo: [facility] Treating Maisen Schmit/Extender: Tilda Franco in Treatment: 6 Active Problems Location of Pain Severity and Description of Pain Patient Has Paino No Site Locations Pain Management and Medication Current Pain Management: Electronic Signature(s) Signed: 06/27/2021 4:05:54 PM By: Yevonne Pax RN Entered By: Yevonne Pax on 06/27/2021 14:06:37 [name] ([id number]) -------------------------------------------------------------------------------- Patient/Caregiver Education Details [name] Date of Service: 06/27/2021 2:00 PM Medical Record Number: [id number] Patient Account Number: [id number] Date of Birth/Gender: 08-27-1945 (75 y.o. F) Treating RN: Yevonne Pax Primary Care Physician: [facility] Other Clinician: Referring Physician: [facility] Treating Physician/Extender: Tilda Franco in Treatment: 6 Education Assessment Education Provided To: Patient Education Topics Provided Wound/Skin Impairment: Methods: Explain/Verbal Responses: State content correctly Electronic Signature(s) Signed: 06/27/2021 4:05:54 PM By: Yevonne Pax RN Entered By: Yevonne Pax on 06/27/2021 14:19:25 [name] ([id number]) -------------------------------------------------------------------------------- Wound Assessment Details [name] Date of  Service: 06/27/2021 2:00 PM Medical Record Number: [id number] Patient Account Number: [id number] Date of Birth/Sex: 04/15/1946 (75 y.o. F) Treating RN: Yevonne Pax Primary Care Brigitte Soderberg: [facility] Other Clinician: Referring Ayen Viviano: [facility] Treating Lavonne Cass/Extender: Tilda Franco in Treatment: 6 Wound Status Wound Number: 4 Primary Trauma, Other Etiology: Wound Location: Left Knee Wound Status: Open Wounding Event: Hematoma Comorbid Chronic Obstructive Pulmonary Disease (COPD), Date Acquired: 04/13/2021 History: Hypertension, Type II Diabetes Weeks Of Treatment: 6 Clustered Wound: Yes Photos Wound Measurements Length: (cm) 0 Width: (cm) 0 Depth: (cm) 0 Area: (cm) 0 Volume: (cm) 0 % Reduction in Area: 100% % Reduction in Volume: 100% Epithelialization: Large (67-100%) Tunneling: No Undermining: No Wound Description Classification: Full Thickness Without Exposed Support Structures Exudate Amount: None Present Foul Odor After Cleansing: No Slough/Fibrino No Wound Bed Granulation Amount: None Present (0%) Exposed Structure Necrotic Amount: None Present (0%) Fascia Exposed: No Fat Layer (Subcutaneous Tissue) Exposed: No Tendon Exposed: No Muscle Exposed: No Joint Exposed: No Bone Exposed: No Electronic Signature(s) Signed: 06/27/2021 2:10:11 PM By: Yevonne Pax RN Entered By: Yevonne Pax on 06/27/2021 14:10:10 Patrie, Maday C. ([id number]) -------------------------------------------------------------------------------- Vitals  Details [name], [name] Date of Service: 06/27/2021 2:00 PM Medical Record Number: [id number] Patient Account Number: [id number] Date of Birth/Sex: [birth year] (75 y.o. F) Treating RN: Yevonne Pax Primary Care Somer Trotter: [facility] Other Clinician: Referring Tjuana Vickrey: [facility], [facility] Treating Grizel Vesely/Extender: Tilda Franco in Treatment: 6 Vital Signs Time Taken:  14:06 Temperature (F): 97.7 Height (in): 67 Pulse (bpm): 75 Weight (lbs): 163 Respiratory Rate (breaths/min): 16 Body Mass Index (BMI): 25.5 Blood Pressure (mmHg): 139/71 Reference Range: 80 - 120 mg / dl Electronic Signature(s) Signed: 06/27/2021 4:05:54 PM By: Yevonne Pax RN Entered By: Yevonne Pax on 06/27/2021 14:06:29

## 2021-07-04 ENCOUNTER — Ambulatory Visit: Payer: Medicare Other | Admitting: Internal Medicine

## 2021-07-11 ENCOUNTER — Ambulatory Visit: Payer: Medicare Other | Admitting: Internal Medicine

## 2021-08-02 ENCOUNTER — Other Ambulatory Visit: Payer: Self-pay | Admitting: Specialist

## 2021-08-02 DIAGNOSIS — R918 Other nonspecific abnormal finding of lung field: Secondary | ICD-10-CM

## 2021-08-16 ENCOUNTER — Ambulatory Visit
Admission: RE | Admit: 2021-08-16 | Discharge: 2021-08-16 | Disposition: A | Discharge status: Home / Self Care | Payer: Medicare Other | Source: Ambulatory Visit | Attending: Specialist | Admitting: Specialist

## 2021-08-16 ENCOUNTER — Other Ambulatory Visit: Payer: Self-pay

## 2021-08-16 DIAGNOSIS — R918 Other nonspecific abnormal finding of lung field: Secondary | ICD-10-CM | POA: Insufficient documentation

## 2021-10-09 ENCOUNTER — Other Ambulatory Visit: Payer: Self-pay | Admitting: Internal Medicine

## 2021-10-09 DIAGNOSIS — Z1231 Encounter for screening mammogram for malignant neoplasm of breast: Secondary | ICD-10-CM

## 2021-11-22 ENCOUNTER — Ambulatory Visit: Payer: Medicare Other

## 2021-12-18 ENCOUNTER — Ambulatory Visit
Admission: RE | Admit: 2021-12-18 | Discharge: 2021-12-18 | Disposition: A | Discharge status: Home / Self Care | Payer: Medicare Other | Source: Ambulatory Visit | Attending: Internal Medicine | Admitting: Internal Medicine

## 2021-12-18 DIAGNOSIS — Z1231 Encounter for screening mammogram for malignant neoplasm of breast: Secondary | ICD-10-CM | POA: Diagnosis present

## 2022-09-18 ENCOUNTER — Other Ambulatory Visit: Payer: Self-pay | Admitting: Specialist

## 2022-09-18 DIAGNOSIS — J439 Emphysema, unspecified: Secondary | ICD-10-CM

## 2022-09-18 DIAGNOSIS — F17210 Nicotine dependence, cigarettes, uncomplicated: Secondary | ICD-10-CM

## 2022-10-02 ENCOUNTER — Ambulatory Visit
Admission: RE | Admit: 2022-10-02 | Discharge: 2022-10-02 | Disposition: A | Discharge status: Home / Self Care | Payer: Medicare Other | Source: Ambulatory Visit | Attending: Specialist | Admitting: Specialist

## 2022-10-02 DIAGNOSIS — J439 Emphysema, unspecified: Secondary | ICD-10-CM | POA: Insufficient documentation

## 2022-10-02 DIAGNOSIS — F17210 Nicotine dependence, cigarettes, uncomplicated: Secondary | ICD-10-CM | POA: Insufficient documentation

## 2023-09-19 ENCOUNTER — Other Ambulatory Visit: Payer: Self-pay | Admitting: Specialist

## 2023-09-19 DIAGNOSIS — J449 Chronic obstructive pulmonary disease, unspecified: Secondary | ICD-10-CM

## 2023-09-19 DIAGNOSIS — F17210 Nicotine dependence, cigarettes, uncomplicated: Secondary | ICD-10-CM

## 2024-04-20 ENCOUNTER — Other Ambulatory Visit: Payer: Self-pay

## 2024-04-20 ENCOUNTER — Emergency Department
Admission: EM | Admit: 2024-04-20 | Discharge: 2024-04-20 | Disposition: A | Discharge status: Home / Self Care | Attending: Emergency Medicine | Admitting: Emergency Medicine

## 2024-04-20 ENCOUNTER — Emergency Department

## 2024-04-20 DIAGNOSIS — Y92512 Supermarket, store or market as the place of occurrence of the external cause: Secondary | ICD-10-CM | POA: Insufficient documentation

## 2024-04-20 DIAGNOSIS — I10 Essential (primary) hypertension: Secondary | ICD-10-CM | POA: Diagnosis not present

## 2024-04-20 DIAGNOSIS — S59911A Unspecified injury of right forearm, initial encounter: Secondary | ICD-10-CM | POA: Diagnosis present

## 2024-04-20 DIAGNOSIS — J449 Chronic obstructive pulmonary disease, unspecified: Secondary | ICD-10-CM | POA: Insufficient documentation

## 2024-04-20 DIAGNOSIS — S0003XA Contusion of scalp, initial encounter: Secondary | ICD-10-CM | POA: Insufficient documentation

## 2024-04-20 DIAGNOSIS — E119 Type 2 diabetes mellitus without complications: Secondary | ICD-10-CM | POA: Diagnosis not present

## 2024-04-20 DIAGNOSIS — S51811A Laceration without foreign body of right forearm, initial encounter: Secondary | ICD-10-CM | POA: Insufficient documentation

## 2024-04-20 DIAGNOSIS — Z23 Encounter for immunization: Secondary | ICD-10-CM | POA: Insufficient documentation

## 2024-04-20 DIAGNOSIS — W19XXXA Unspecified fall, initial encounter: Secondary | ICD-10-CM | POA: Diagnosis not present

## 2024-04-20 MED ORDER — TETANUS-DIPHTH-ACELL PERTUSSIS 5-2-15.5 LF-MCG/0.5 IM SUSP
0.5000 mL | Freq: Once | INTRAMUSCULAR | Status: AC
  Administered 2024-04-20: 0.5 mL via INTRAMUSCULAR
  Filled 2024-04-20: qty 0.5

## 2024-04-20 MED ORDER — CEPHALEXIN 500 MG PO CAPS: 500.0000 mg | ORAL_CAPSULE | Freq: Two times a day (BID) | ORAL | 0 refills | Status: AC

## 2024-04-20 MED ORDER — LIDOCAINE-EPINEPHRINE-TETRACAINE (LET) TOPICAL GEL
3.0000 mL | Freq: Once | TOPICAL | Status: AC
  Administered 2024-04-20: 3 mL via TOPICAL
  Filled 2024-04-20: qty 3

## 2024-04-20 NOTE — ED Provider Notes (Addendum)
 [facility] Emergency Department Provider Note     Event Date/Time   First MD Initiated Contact with Patient 04/20/24 1418     (approximate)   History   Arm Injury   HPI  [name] is a 78 y.o. female with a past medical history of HTN, COPD and diabetes presents to the ED for evaluation of a mechanical fall at the grocery store sustaining a skin tear to her right forearm from a metal shelf.  Unknown tetanus.  Endorses head injury without LOC.  Noted frontal hematoma noted.  No other complaint.     Physical Exam   Triage Vital Signs: ED Triage Vitals  Encounter Vitals Group     BP 04/20/24 1340 (!) 156/72     Girls Systolic BP Percentile --      Girls Diastolic BP Percentile --      Boys Systolic BP Percentile --      Boys Diastolic BP Percentile --      Pulse Rate 04/20/24 1340 77     Resp 04/20/24 1340 17     Temp 04/20/24 1340 97.7 F (36.5 C)     Temp Source 04/20/24 1340 Oral     SpO2 04/20/24 1340 94 %     Weight 04/20/24 1341 150 lb (68 kg)     Height 04/20/24 1439 5' 7 (1.702 m)     Head Circumference --      Peak Flow --      Pain Score 04/20/24 1341 6     Pain Loc --      Pain Education --      Exclude from Growth Chart --     Most recent vital signs: Vitals:   04/20/24 1340  BP: (!) 156/72  Pulse: 77  Resp: 17  Temp: 97.7 F (36.5 C)  SpO2: 94%    General: Well appearing and comfortable. Alert and oriented. INAD.  Skin:  Moderate size hematoma to left side frontal scalp   Head:  NCAT.  Eyes:  PERRLA. EOMI.  Ears:  No postauricular ecchymosis.   Nose:   Mucosa is moist. No rhinorrhea. Neck:   No cervical spine tenderness to palpation. Full ROM without difficulty.  CV:  Good peripheral perfusion. RRR. No peripheral edema.  RESP:  Normal effort. LCTAB.  BACK:  Spinous process is midline without deformity or tenderness. MSK:   Full ROM in all joints. No swelling, deformity or tenderness.  NEURO: Cranial  nerves  intact. No focal deficits. Speech clear. Sensation and motor function intact. Normal muscle strength of UE & LE. Gait is steady.   Other:  Superficial skin tear on right forearm from lateral aspect of wrist to elbow underlying ecchymosis  ED Results / Procedures / Treatments   Labs (all labs ordered are listed, but only abnormal results are displayed) Labs Reviewed - No data to display  RADIOLOGY  I personally viewed and evaluated these images as part of my medical decision making, as well as reviewing the written report by the radiologist.  ED Provider Interpretation: No intracranial abnormality noted  CT Head Wo Contrast Result Date: 04/20/2024 CLINICAL DATA:  Head trauma, minor (Age >= 65y) Tripped in the grocery store. EXAM: CT HEAD WITHOUT CONTRAST TECHNIQUE: Contiguous axial images were obtained from the base of the skull through the vertex without intravenous contrast. RADIATION DOSE REDUCTION: This exam was performed according to the departmental dose-optimization program which includes automated exposure control, adjustment of the mA and/or  kV according to patient size and/or use of iterative reconstruction technique. COMPARISON:  None Available. FINDINGS: Brain: No intracranial hemorrhage, mass effect, or midline shift. Brain volume is normal for age. No hydrocephalus. The basilar cisterns are patent. No evidence of territorial infarct or acute ischemia. No extra-axial or intracranial fluid collection. Vascular: Atherosclerosis of skullbase vasculature without hyperdense vessel or abnormal calcification. Skull: No fracture or focal lesion. Sinuses/Orbits: No acute finding. Other: Small frontal scalp hematoma just to the left of midline. IMPRESSION: Small frontal scalp hematoma. No acute intracranial abnormality. No skull fracture. Electronically Signed   By: Andrea Gasman M.D.   On: 04/20/2024 15:51    PROCEDURES:  Critical Care performed: No  .Laceration  Repair  Date/Time: 04/20/2024 5:04 PM  Performed by: Margrette Monte A, PA-C Authorized by: Margrette Monte A, PA-C   Consent:    Consent obtained:  Verbal   Consent given by:  Patient   Risks discussed:  Infection, pain, poor cosmetic result, need for additional repair and poor wound healing Laceration details:    Location:  Shoulder/arm   Shoulder/arm location:  R lower arm   Length (cm):  30   Depth (mm):  0.1 Exploration:    Hemostasis achieved with:  LET Treatment:    Area cleansed with:  Saline Skin repair:    Repair method:  Steri-Strips   Number of Steri-Strips:  6 Approximation:    Approximation:  Close Repair type:    Repair type:  Simple Post-procedure details:    Dressing:  Non-adherent dressing and bulky dressing   Procedure completion:  Tolerated    MEDICATIONS ORDERED IN ED: Medications  Tdap (ADACEL) injection 0.5 mL (has no administration in time range)  lidocaine -EPINEPHrine -tetracaine (LET) topical gel (3 mLs Topical Given 04/20/24 1448)     IMPRESSION / MDM / ASSESSMENT AND PLAN / ED COURSE  I reviewed the triage vital signs and the nursing notes.                               78 y.o. female presents to the emergency department for evaluation and treatment of fall sustaining skin tear. See HPI for further details.   Differential diagnosis includes, but is not limited to ICH, fracture, skin tear, abrasion, laceration, hematoma  Patient's presentation is most consistent with acute complicated illness / injury requiring diagnostic workup.  Patient is alert and oriented.  She is hemodynamically stable.  Physical exam findings are stated above.  Head CT is reassuring.  Please see procedure note for skin tear repair.  Skin unable to hold suture.  Steri-Strips placed.  Non-adherent dressing applied.  Tetanus updated.  Will send short course of antibiotics to pharmacy.  Education on wound care provided.  Patient is in stable condition for discharge home.   Advised follow-up with PCP as needed.  ED return precaution discussed.   FINAL CLINICAL IMPRESSION(S) / ED DIAGNOSES   Final diagnoses:  Fall, initial encounter  Skin tear of forearm without complication, right, initial encounter   Rx / DC Orders   ED Discharge Orders          Ordered    cephALEXin (KEFLEX) 500 MG capsule  2 times daily        04/20/24 1638             Note:  This document was prepared using Dragon voice recognition software and may include unintentional dictation errors.    Margrette, Kashawn Dirr A,  PA-C 04/20/24 1654    Margrette, Lenna Hagarty A, PA-C 04/20/24 1706    Waymond Lorelle Cummins, MD 04/20/24 [phone]

## 2024-04-20 NOTE — Discharge Instructions (Addendum)
 You were seen today for a fall.  Your head CT is normal. Your tetanus was updated today.   Keep area dry for first 24 hrs. Gently use soap & water around the area.  DO NOT USE alcohol, hydrogen peroxide etc, to clean skin. Do not submerge arm in water. You may cover the incision with clean gauze & replace it after your daily shower for your comfort. Try not to get area wet in shower. If you have skin tapes (Steristrips) on your incision, leave them in place. They will fall off on their own like a scab in a few weeks.  You may trim any edges that curl up with clean scissors.

## 2024-04-20 NOTE — ED Notes (Signed)
 See triage note  Presents s/p fall  States she hit a shelf  Bruising noted to forehead Large skin tear noted to right forearm

## 2024-04-20 NOTE — ED Triage Notes (Signed)
 Pt sts that she has a skin tear to her right arm after tripping in the grocery store and then hitting her arm and head on the shelf.

## 2024-08-06 ENCOUNTER — Other Ambulatory Visit: Payer: Self-pay | Admitting: Specialist

## 2024-08-06 DIAGNOSIS — J449 Chronic obstructive pulmonary disease, unspecified: Secondary | ICD-10-CM

## 2024-08-06 DIAGNOSIS — F17210 Nicotine dependence, cigarettes, uncomplicated: Secondary | ICD-10-CM
